# Patient Record
Sex: Female | Born: 1940 | Race: White | Hispanic: No | Marital: Married | Smoking: Never smoker
Health system: Southern US, Community
[De-identification: ages and names within clinical notes are randomized; demographics above are authoritative.]

## PROBLEM LIST (undated history)

## (undated) DIAGNOSIS — F5104 Psychophysiologic insomnia: Secondary | ICD-10-CM

## (undated) DIAGNOSIS — E785 Hyperlipidemia, unspecified: Secondary | ICD-10-CM

## (undated) DIAGNOSIS — K635 Polyp of colon: Secondary | ICD-10-CM

## (undated) DIAGNOSIS — I10 Essential (primary) hypertension: Secondary | ICD-10-CM

## (undated) HISTORY — PX: TUBAL LIGATION: SHX77

## (undated) HISTORY — DX: Polyp of colon: K63.5

## (undated) HISTORY — PX: OTHER SURGICAL HISTORY: SHX169

## (undated) HISTORY — PX: COLONOSCOPY: SHX174

## (undated) HISTORY — DX: Hyperlipidemia, unspecified: E78.5

## (undated) HISTORY — DX: Psychophysiologic insomnia: F51.04

## (undated) HISTORY — PX: BREAST EXCISIONAL BIOPSY: SUR124

## (undated) HISTORY — PX: BREAST SURGERY: SHX581

## (undated) HISTORY — DX: Essential (primary) hypertension: I10

---

## 1999-01-06 ENCOUNTER — Encounter: Admission: RE | Admit: 1999-01-06 | Discharge: 1999-02-08 | Payer: Self-pay | Admitting: Orthopedic Surgery

## 1999-03-31 ENCOUNTER — Encounter: Admission: RE | Admit: 1999-03-31 | Discharge: 1999-04-19 | Payer: Self-pay | Admitting: Orthopedic Surgery

## 2001-02-22 ENCOUNTER — Emergency Department (HOSPITAL_COMMUNITY): Admission: EM | Admit: 2001-02-22 | Discharge: 2001-02-22 | Payer: Self-pay | Admitting: *Deleted

## 2006-04-19 ENCOUNTER — Ambulatory Visit (HOSPITAL_BASED_OUTPATIENT_CLINIC_OR_DEPARTMENT_OTHER): Admission: RE | Admit: 2006-04-19 | Discharge: 2006-04-19 | Payer: Self-pay | Admitting: Surgery

## 2006-04-19 ENCOUNTER — Encounter (INDEPENDENT_AMBULATORY_CARE_PROVIDER_SITE_OTHER): Payer: Self-pay | Admitting: Specialist

## 2006-11-04 ENCOUNTER — Ambulatory Visit (HOSPITAL_COMMUNITY): Admission: RE | Admit: 2006-11-04 | Discharge: 2006-11-04 | Payer: Self-pay | Admitting: Family Medicine

## 2007-02-27 HISTORY — PX: KNEE SURGERY: SHX244

## 2007-03-19 ENCOUNTER — Inpatient Hospital Stay (HOSPITAL_COMMUNITY): Admission: RE | Admit: 2007-03-19 | Discharge: 2007-03-21 | Payer: Self-pay | Admitting: Orthopedic Surgery

## 2007-05-12 ENCOUNTER — Encounter: Admission: RE | Admit: 2007-05-12 | Discharge: 2007-08-10 | Payer: Self-pay | Admitting: Orthopedic Surgery

## 2007-08-11 ENCOUNTER — Encounter: Admission: RE | Admit: 2007-08-11 | Discharge: 2007-09-10 | Payer: Self-pay | Admitting: Orthopedic Surgery

## 2008-12-08 ENCOUNTER — Ambulatory Visit: Payer: Self-pay | Admitting: Internal Medicine

## 2009-01-29 LAB — HM COLONOSCOPY

## 2009-02-01 ENCOUNTER — Ambulatory Visit: Payer: Self-pay | Admitting: Internal Medicine

## 2009-02-03 ENCOUNTER — Encounter: Payer: Self-pay | Admitting: Internal Medicine

## 2009-02-24 ENCOUNTER — Telehealth: Payer: Self-pay | Admitting: Internal Medicine

## 2009-03-01 LAB — HM PAP SMEAR: HM Pap smear: NEGATIVE

## 2009-10-26 ENCOUNTER — Ambulatory Visit: Payer: Self-pay | Admitting: Cardiology

## 2009-11-02 ENCOUNTER — Ambulatory Visit: Payer: Self-pay | Admitting: Cardiology

## 2009-12-30 LAB — HM MAMMOGRAPHY: HM Mammogram: NEGATIVE

## 2010-07-11 NOTE — Op Note (Signed)
NAME:  Cassandra Foster, Cassandra Foster              ACCOUNT NO.:  [id number]   MEDICAL RECORD NO.:  [id number]          PATIENT TYPE:  OIB   LOCATION:  0098                         FACILITY:  [facility]   PHYSICIAN:  Georges Lynch. Gioffre, M.D.DATE OF BIRTH:  12/09/1940   DATE OF PROCEDURE:  03/19/2007  DATE OF DISCHARGE:                               OPERATIVE REPORT   SURGEON:  Georges Lynch. Darrelyn Hillock, M.D.   ASSISTANT:  Jamelle Rushing, P.A.   PREOPERATIVE DIAGNOSIS:  Comminuted fracture of the left patella.   POSTOPERATIVE DIAGNOSIS:  Comminuted fracture of the left patella.   OPERATION:  1. Open reduction internal fixation of a comminuted fracture of the      left patella utilizing two 4-0 cannulated DePuy screws.  2. We also utilized a Restore graft to the patellar tendon and      fracture site.   PROCEDURE:  Under general anesthesia, routine orthopedic prep and drape  of left lower extremity was carried out.  The leg was exsanguinated with  an Esmarch and tourniquet was elevated at 350 mmHg.  A curvilinear  incision was made over the anterior aspect of the left leg.  She had a  healed abrasion over the anterior aspect of her leg so great care was  taken not to go through that abrasion site.  The abrasion site was  totally clean.  We waited approximately 5 days before any surgery was  done because of that.  She also was on Keflex prior to the surgery.  We  then curetted to create the two flaps.  We went down and identified the  fracture site.  Initially it looked as though the fracture site was a  clean fracture, but as we went down and explored it, it was comminuted  out laterally.  We were able to take a towel clip, hold the edges  together and two K-wires were placed up across the fracture site.  Two 4-  0 screws measuring 40 mm and 32 mm for both screws.  One was 40 was 32  mm in length were used across the fracture site.  Note, because of the  comminution and because of the condition of the patellar  tendon once we  repaired the fracture site we utilized a Restore graft as well  anteriorly.  C-arm was brought in to make sure the screws were not in  the articular surface of the joint space and they were not.  They were  in good position.  We thoroughly irrigated out the area, reapproximated  everything as we mentioned.  The tendon was reapproximated, that is the  patellar tendon and we put a nice graft over the patellar and quadriceps  tendon region.  We sutured that in place with zero Vicryl suture.  The  subcu was closed with zero Vicryl, skin with metal staples.  Sterile  Neosporin dressing was applied.  She was kept in extension and placed in  a nice knee immobilizer.  She had 1 gram of IV Ancef preop.           ______________________________  Georges Lynch. Darrelyn Hillock, M.D.  RAG/MEDQ  D:  03/19/2007  T:  03/19/2007  Job:  [id number]   cc:   Windy Fast A. Darrelyn Hillock, M.D.  Fax: [phone]

## 2010-07-14 NOTE — Op Note (Signed)
NAME:  Cassandra Foster, Cassandra Foster              ACCOUNT NO.:  [id number]   MEDICAL RECORD NO.:  [id number]          PATIENT TYPE:  AMB   LOCATION:  DSC                          FACILITY:  [facility]   PHYSICIAN:  Velora Heckler, MD      DATE OF BIRTH:  October 09, 1940   DATE OF PROCEDURE:  DATE OF DISCHARGE:                               OPERATIVE REPORT   PREOPERATIVE DIAGNOSIS:  Soft tissue mass, right shoulder (6 cm x 6 cm x  3 cm).   POSTOPERATIVE DIAGNOSIS:  Soft tissue mass, right shoulder (6 cm x 6 cm  x 3 cm).   PROCEDURE:  Excision soft tissue mass, right shoulder (6 cm x 6 cm x 3  cm).   SURGEON:  Velora Heckler, MD, FACS   ASSISTANT:  None.   ANESTHESIA:  Local with intravenous sedation per Dr. Claybon Jabs.   ESTIMATED BLOOD LOSS:  Minimal.   PREPARATION:  Betadine.   COMPLICATIONS:  None.   INDICATIONS:  The patient is a 70 year old white female from [place],  [place].  She has a soft tissue mass on the right shoulder.  She  had had previous excision a number of years ago, but the mass had  recurred.  It has essentially doubled in size over the past year.  She  now comes to surgery for excision.   DESCRIPTION OF PROCEDURE:  The procedure is done OR #8 at the [facility].  The patient is brought to the operating room and placed  in a prone position on the operating room table.  Following  administration of intravenous sedation, the patient was prepped and  draped in the usual strict aseptic fashion.  After ascertaining and an  adequate level of sedation had been obtained, the skin overlying the  mass was anesthetized with local anesthetic.  Local anesthetic is  infiltrated around the previous broad scar.  Using a #15 blade, the  previous scar is excised with the mass.  Incision is extended medially  over the mass.  Skin flaps are elevated circumferentially.  Using the  electrocautery for hemostasis the mass is completely excised down to the  underlying muscle  fascia.  This has the appearance of a multi-lobulated  lipoma.  Good hemostasis is obtained with the electrocautery.  Mass  measures approximately 6 cm in greatest dimension and is submitted to  pathology for review.  Good hemostasis is achieved.  Subcutaneous  tissues are reapproximated with interrupted 3-0 Vicryl sutures.  Skin is  closed with a  running 4-0 Monocryl subcuticular suture.  Wound is washed and dried and  Benzo and Steri-Strips are applied.  Sterile dressings are applied.  The  patient is awakened from anesthesia and brought to the recovery room in  stable condition.  The patient tolerated the procedure well.      Velora Heckler, MD  Electronically Signed     TMG/MEDQ  D:  04/19/2006  T:  04/20/2006  Job:  [id number]   cc:   Alfredia Client, MD  Colleen Can. Deborah Chalk, M.D.

## 2010-11-17 LAB — COMPREHENSIVE METABOLIC PANEL
ALT: 44 — ABNORMAL HIGH
AST: 37
Calcium: 9.3
Creatinine, Ser: 0.67
Sodium: 133 — ABNORMAL LOW
Total Protein: 6.6

## 2010-11-17 LAB — CBC
HCT: 38.1
MCHC: 34.7
MCV: 90.2
Platelets: 146 — ABNORMAL LOW
RDW: 12.6

## 2010-11-17 LAB — URINALYSIS, ROUTINE W REFLEX MICROSCOPIC
Bilirubin Urine: NEGATIVE
Hgb urine dipstick: NEGATIVE
Nitrite: NEGATIVE
Protein, ur: NEGATIVE
Specific Gravity, Urine: 1.029
Urobilinogen, UA: 0.2

## 2010-11-17 LAB — DIFFERENTIAL
Basophils Absolute: 0
Eosinophils Relative: 0
Lymphocytes Relative: 8 — ABNORMAL LOW
Lymphs Abs: 1.3
Monocytes Relative: 6
Neutro Abs: 13.5 — ABNORMAL HIGH

## 2010-11-17 LAB — PROTIME-INR
INR: 0.9
INR: 1.4
Prothrombin Time: 12.7
Prothrombin Time: 17.2 — ABNORMAL HIGH

## 2010-11-17 LAB — APTT: aPTT: 32

## 2010-11-17 LAB — URINE MICROSCOPIC-ADD ON

## 2010-11-30 ENCOUNTER — Telehealth: Payer: Self-pay | Admitting: Family Medicine

## 2010-11-30 ENCOUNTER — Encounter: Payer: Self-pay | Admitting: Family Medicine

## 2010-11-30 ENCOUNTER — Ambulatory Visit (INDEPENDENT_AMBULATORY_CARE_PROVIDER_SITE_OTHER): Payer: Medicare Other | Admitting: Family Medicine

## 2010-11-30 DIAGNOSIS — G4700 Insomnia, unspecified: Secondary | ICD-10-CM

## 2010-11-30 DIAGNOSIS — I10 Essential (primary) hypertension: Secondary | ICD-10-CM

## 2010-11-30 DIAGNOSIS — Z23 Encounter for immunization: Secondary | ICD-10-CM

## 2010-11-30 DIAGNOSIS — F5104 Psychophysiologic insomnia: Secondary | ICD-10-CM

## 2010-11-30 DIAGNOSIS — E785 Hyperlipidemia, unspecified: Secondary | ICD-10-CM

## 2010-11-30 DIAGNOSIS — K635 Polyp of colon: Secondary | ICD-10-CM | POA: Insufficient documentation

## 2010-11-30 DIAGNOSIS — M810 Age-related osteoporosis without current pathological fracture: Secondary | ICD-10-CM

## 2010-11-30 LAB — BASIC METABOLIC PANEL
Calcium: 10.6 mg/dL — ABNORMAL HIGH (ref 8.4–10.5)
Creatinine, Ser: 0.6 mg/dL (ref 0.4–1.2)
GFR: 97.36 mL/min (ref >60.00)
Sodium: 139 mEq/L (ref 135–145)

## 2010-11-30 LAB — HEPATIC FUNCTION PANEL
ALT: 30 U/L (ref 0–35)
AST: 28 U/L (ref 0–37)
Albumin: 4.9 g/dL (ref 3.5–5.2)
Alkaline Phosphatase: 86 U/L (ref 39–117)
Total Protein: 7.8 g/dL (ref 6.0–8.3)

## 2010-11-30 LAB — LIPID PANEL
Cholesterol: 183 mg/dL (ref 0–200)
HDL: 64.2 mg/dL (ref >39.00)
Total CHOL/HDL Ratio: 3
Triglycerides: 105 mg/dL (ref 0.0–149.0)

## 2010-11-30 MED ORDER — TRAZODONE HCL 50 MG PO TABS: 50.0000 mg | ORAL_TABLET | Freq: Every day | ORAL | Status: DC

## 2010-11-30 MED ORDER — TRAZODONE HCL 50 MG PO TABS: ORAL_TABLET | ORAL | Status: DC

## 2010-11-30 NOTE — Telephone Encounter (Signed)
This was done earlier.

## 2010-11-30 NOTE — Telephone Encounter (Signed)
Pharmacist called and needs clarification on pts Trazadone instructions.  Pls call asap.

## 2010-11-30 NOTE — Progress Notes (Signed)
  Subjective:    Patient ID: Cassandra Foster, female    DOB: [birth year], 70 y.o.   MRN: [id number]  HPI New patient to establish care. Past medical history reviewed. She has history of chronic insomnia, colon polyps, hypertension, hyperlipidemia. Medications reviewed. Currently takes trazodone 50 mg 2-3 tablets at night has taken this for years. Requests refills. No flu vaccine yet. Pneumovax 2 years ago. Tetanus 2009. Colonoscopy 2010.  Reported osteoporosis history. Followup with gynecologist. Takes vitamin D and calcium and apparently recently started on bisphosphonate but she is not sure the name of medication. Requests vitamin D level today. No lipids in some time. Myalgias with daily simvastatin but currently taking every other day without problem. No history of CAD. Blood pressure fairly well controlled by home readings. No headaches or dizziness.  Family history significant for mother with breast cancer. Father had stroke.  Patient is married. No history of smoking or alcohol use.  Past Medical History  Diagnosis Date  . Colon polyps    Past Surgical History  Procedure Date  . Breast surgery     1985, 1997  . Knee surgery 2009    kneecap    reports that she has never smoked. She does not have any smokeless tobacco history on file. She reports that she does not drink alcohol. Her drug history not on file. family history includes Cancer in her mother and Stroke in her father. No Known Allergies    Review of Systems  Constitutional: Negative for fever, activity change, appetite change, fatigue and unexpected weight change.  HENT: Negative for hearing loss, ear pain, sore throat and trouble swallowing.   Eyes: Negative for visual disturbance.  Respiratory: Negative for cough and shortness of breath.   Cardiovascular: Negative for chest pain and palpitations.  Gastrointestinal: Negative for abdominal pain, diarrhea, constipation and blood in stool.  Genitourinary: Negative for  dysuria and hematuria.  Musculoskeletal: Negative for myalgias, back pain and arthralgias.  Skin: Negative for rash.  Neurological: Negative for dizziness, syncope and headaches.  Hematological: Negative for adenopathy.  Psychiatric/Behavioral: Negative for confusion and dysphoric mood.       Objective:   Physical Exam  Constitutional: She is oriented to person, place, and time. She appears well-developed and well-nourished.  HENT:  Right Ear: External ear normal.  Left Ear: External ear normal.  Mouth/Throat: No oropharyngeal exudate.  Neck: Neck supple. No thyromegaly present.  Cardiovascular: Normal rate, regular rhythm and normal heart sounds.  Exam reveals no gallop.   No murmur heard. Pulmonary/Chest: Effort normal and breath sounds normal. No respiratory distress. She has no wheezes. She has no rales.  Abdominal: Soft. There is no tenderness.  Musculoskeletal: She exhibits no edema.  Lymphadenopathy:    She has no cervical adenopathy.  Neurological: She is alert and oriented to person, place, and time. No cranial nerve deficit.  Skin: No rash noted.  Psychiatric: She has a normal mood and affect. Her behavior is normal.          Assessment & Plan:  #1 hyperlipidemia. Check lipid and hepatic panel #2 hypertension stable. Continue current medication and monitor. Work on weight loss #3 chronic insomnia. Titrate trazodone to 150 mg each bedtime with refills given. Educational sheet given on chronic insomnia #4 history of colon polyps and family history colon cancer Brother #5 history of reported osteoporosis. Reassess vitamin D level. Patient plans to get followup DEXA scan through gynecologist #6 health maintenance. Flu vaccine given.

## 2010-11-30 NOTE — Patient Instructions (Signed)

## 2010-12-01 LAB — VITAMIN D 25 HYDROXY (VIT D DEFICIENCY, FRACTURES): Vit D, 25-Hydroxy: 69 ng/mL (ref 30–89)

## 2010-12-01 NOTE — Progress Notes (Signed)
Quick Note:  Pt informed ______ 

## 2010-12-06 ENCOUNTER — Telehealth: Payer: Self-pay | Admitting: Family Medicine

## 2010-12-06 NOTE — Telephone Encounter (Signed)
Pt wanted to know if she could have her blood work sent to her for her and her husband. Please contact

## 2010-12-06 NOTE — Telephone Encounter (Signed)
Cassandra Foster[birth year] labs and pt labs mailed to their home, pt aware

## 2011-01-31 ENCOUNTER — Telehealth: Payer: Self-pay

## 2011-01-31 NOTE — Telephone Encounter (Signed)
Pt is going to gynecology appt on Monday and needs her blood work sent to his office.  Dr. Jolayne Haines [facility].  Labs have been faxed.

## 2011-02-21 ENCOUNTER — Telehealth: Payer: Self-pay

## 2011-02-21 NOTE — Telephone Encounter (Signed)
Per Dr. Tawanna Cooler pt to take 1/2 losartan in the am and 1 losartan at bedtime.  Pt aware of appt with Dr. Tawanna Cooler on 02/22/11.  Pt aware if bp is too high in the morning to take 1/2 losartan if bp is normal do not take 1/2. Pt aware if symptoms worsen to go to Urgent Care or ER.

## 2011-02-21 NOTE — Telephone Encounter (Signed)
Pt states she has congestion, stuffy/ runny nose, headache, fatigue and a deep hard cough.  Pt denies fever. Pt her blood pressure last night was 187/86, pt's bp now is 173/78 and pulse is 91.  Pt states she has been taking Robutssin DM; last dose was at 8 am.  Pt takes her losartan at night time.    Per Dr. Tawanna Cooler pt to take 1/2 losartan at 6pm. Pt advised  To not consume salt and stay well hydrated.

## 2011-02-22 ENCOUNTER — Encounter: Payer: Self-pay | Admitting: Family Medicine

## 2011-02-22 ENCOUNTER — Ambulatory Visit (INDEPENDENT_AMBULATORY_CARE_PROVIDER_SITE_OTHER): Payer: Medicare Other | Admitting: Family Medicine

## 2011-02-22 DIAGNOSIS — I10 Essential (primary) hypertension: Secondary | ICD-10-CM

## 2011-02-22 DIAGNOSIS — J069 Acute upper respiratory infection, unspecified: Secondary | ICD-10-CM

## 2011-02-22 MED ORDER — HYDROCODONE-HOMATROPINE 5-1.5 MG/5ML PO SYRP
ORAL_SOLUTION | ORAL | Status: DC
Start: 2011-02-22 — End: 2011-04-02

## 2011-02-22 NOTE — Patient Instructions (Signed)
Stop all the over-the-counter cough and cold medications that you have been taking, including the old cough syrup.  Drink lots of water, Hydromet 0.5-teaspoon up to 3 times a day for cough and cold.  Continue the losartin ,,,,,,,,,,,,, one tablet daily in the morning.  Check her blood pressure daily in the morning and return to see Dr. Caryl Never in one week with the data and the device

## 2011-02-22 NOTE — Progress Notes (Signed)
  Subjective:    Patient ID: Cassandra Foster, female    DOB: [birth year], 70 y.o.   MRN: [id number]  HPI Cassandra Foster is a 70-year-old female nonsmoker, patient of Dr. Bb the comes in today for evaluation of hypertension, and a cold.  She set a cold for about for 5 days and began taking over-the-counter medications, which included Mucinex, Robitussin, and some old cough syrup.  Another doctor gave her years ago.  Yesterday her blood pressure when up 180/86.  She was advised to take an extra 50 mg of low start and stop the over-the-counter medications and come in today for follow-up.  She states this morning.  She took 2 teaspoons of Robitussin.  BP by me right arm sitting position 150/70  Or cold symptoms or head congestion, postnasal drip, and cough.  No fever, no sputum production.  No wheezing   Review of Systems    General pulmonary review of systems otherwise negative Objective:   Physical Exam  Well-developed well-nourished, female, in no acute distress.  HEENT negative.  Neck was supple.  No adenopathy.  Lungs are clear.  BP right arm sitting position 150/70, pulse 70 and regular      Assessment & Plan:  Viral syndrome treat symptomatically.  Stop all the over-the-counter medications, which are increasing.  Her blood pressure.  Elevated blood pressure secondary to over-the-counter medications.  Again, stop the over-the-counter medication .......... Continue the losartan 100 mg daily BP check daily in the morning.  Follow-up with Dr. Caryl Never in one week for follow-up

## 2011-03-01 ENCOUNTER — Encounter: Payer: Self-pay | Admitting: Family Medicine

## 2011-03-01 ENCOUNTER — Ambulatory Visit (INDEPENDENT_AMBULATORY_CARE_PROVIDER_SITE_OTHER): Payer: Medicare Other | Admitting: Family Medicine

## 2011-03-01 VITALS — BP 144/70 | Temp 98.2°F | Wt 154.0 lb

## 2011-03-01 DIAGNOSIS — I10 Essential (primary) hypertension: Secondary | ICD-10-CM

## 2011-03-01 MED ORDER — LOSARTAN POTASSIUM-HCTZ 100-12.5 MG PO TABS: 1.0000 | ORAL_TABLET | Freq: Every day | ORAL | Status: DC

## 2011-03-01 NOTE — Progress Notes (Signed)
  Subjective:    Patient ID: Cassandra Foster, female    DOB: 07-21-1940, 71 y.o.   MRN: [id number]  HPI  Followup hypertension. Takes losartan 100 mg daily. Recent cold-like symptoms. Taking over-the-counter medications including possibly Mucinex D. Blood pressure elevated with those medications. Has been off over-the-counter supplements for one week. Cough improved. No fever. Had one blood pressure reading 187/86. Mostly 140-150 systolic by home readings. Brings in home cuff today to compare with ours and very consistent readings. No headaches. No dizziness.   Review of Systems  Constitutional: Negative for fatigue.  Eyes: Negative for visual disturbance.  Respiratory: Negative for cough, chest tightness, shortness of breath and wheezing.   Cardiovascular: Negative for chest pain, palpitations and leg swelling.  Neurological: Negative for dizziness, seizures, syncope, weakness, light-headedness and headaches.       Objective:   Physical Exam  Constitutional: She appears well-developed and well-nourished.  Neck: Neck supple. No thyromegaly present.  Cardiovascular: Normal rate and regular rhythm.   Pulmonary/Chest: Effort normal and breath sounds normal. No respiratory distress. She has no wheezes. She has no rales.  Musculoskeletal: She exhibits no edema.  Lymphadenopathy:    She has no cervical adenopathy.          Assessment & Plan:  Hypertension. Suboptimal control. Change losartan to losartan HCTZ 100/12.5 one daily. Reassess blood pressure one month. Continue weight loss efforts. Avoid decongestants.

## 2011-04-02 ENCOUNTER — Encounter: Payer: Self-pay | Admitting: Family Medicine

## 2011-04-02 ENCOUNTER — Ambulatory Visit (INDEPENDENT_AMBULATORY_CARE_PROVIDER_SITE_OTHER): Payer: Medicare Other | Admitting: Family Medicine

## 2011-04-02 VITALS — BP 160/88 | Temp 97.8°F | Wt 153.0 lb

## 2011-04-02 DIAGNOSIS — I10 Essential (primary) hypertension: Secondary | ICD-10-CM

## 2011-04-02 DIAGNOSIS — R232 Flushing: Secondary | ICD-10-CM

## 2011-04-02 DIAGNOSIS — K5900 Constipation, unspecified: Secondary | ICD-10-CM

## 2011-04-02 DIAGNOSIS — R5383 Other fatigue: Secondary | ICD-10-CM

## 2011-04-02 LAB — BASIC METABOLIC PANEL
CO2: 26 mEq/L (ref 19–32)
Calcium: 9.3 mg/dL (ref 8.4–10.5)
GFR: 106.83 mL/min (ref >60.00)
Sodium: 131 mEq/L — ABNORMAL LOW (ref 135–145)

## 2011-04-02 LAB — TSH: TSH: 1.52 u[IU]/mL (ref 0.35–5.50)

## 2011-04-02 LAB — HEPATIC FUNCTION PANEL
Alkaline Phosphatase: 72 U/L (ref 39–117)
Bilirubin, Direct: 0.1 mg/dL (ref 0.0–0.3)
Total Bilirubin: 0.5 mg/dL (ref 0.3–1.2)
Total Protein: 7.3 g/dL (ref 6.0–8.3)

## 2011-04-02 NOTE — Progress Notes (Signed)
  Subjective:    Patient ID: Cassandra Foster, female    DOB: November 17, 1940, 71 y.o.   MRN: [id number]  HPI  Followup hypertension. We added losartan HCTZ last visit. Blood pressures have improved by home readings but still has occasional spike up. She's recently had some flushed feeling. Recent labs significant for a minimally elevated calcium of 10.6. No prior history of hypercalcemia (is on HCTZ). She's had some other issues with fatigue and constipation. She taken stool softeners without improvement. Has recently tried MiraLax which seems to help. She's not exercising regularly. Modest fluid intake.  Past Medical History  Diagnosis Date  . Colon polyps   . Hyperlipidemia   . Hypertension   . Osteoporosis   . Chronic insomnia    Past Surgical History  Procedure Date  . Breast surgery     1985, 1997  . Knee surgery 2009    kneecap    reports that she has never smoked. She does not have any smokeless tobacco history on file. She reports that she does not drink alcohol. Her drug history not on file. family history includes Cancer in her mother and Stroke in her father. No Known Allergies    Review of Systems  Constitutional: Positive for fatigue. Negative for fever, chills, appetite change and unexpected weight change.  Respiratory: Negative for cough and shortness of breath.   Cardiovascular: Negative for chest pain, palpitations and leg swelling.  Gastrointestinal: Negative for abdominal pain.  Genitourinary: Negative for dysuria.  Skin: Negative for rash.  Neurological: Negative for dizziness, seizures, syncope, weakness and headaches.  Hematological: Negative for adenopathy. Does not bruise/bleed easily.  Psychiatric/Behavioral: Negative for confusion.       Objective:   Physical Exam  Constitutional: She appears well-developed and well-nourished.  HENT:  Mouth/Throat: Oropharynx is clear and moist.  Eyes: Pupils are equal, round, and reactive to light.  Neck: Neck  supple. No thyromegaly present.  Cardiovascular: Normal rate and regular rhythm.   Pulmonary/Chest: Effort normal and breath sounds normal. No respiratory distress. She has no wheezes. She has no rales.  Musculoskeletal: She exhibits no edema.  Lymphadenopathy:    She has no cervical adenopathy.          Assessment & Plan:  #1 hypertension. Slightly elevated here today but improved by home readings. Work on weight loss and continue to monitor. By home readings she has been mostly in the 120s to 130s systolic and 80s diastolic #2 history of recent mild hypercalcemia. ?related to HCTZ.  Recheck calcium level along with albumin for correction. Further assessment if this is climbing #3 recent issues with fatigue and constipation. Difficulty losing weight. Check TSH.

## 2011-04-02 NOTE — Patient Instructions (Signed)
Reduce sodium intake Work on weight loss Continue to monitor blood pressure

## 2011-04-04 NOTE — Progress Notes (Signed)
Quick Note:  Pt informed ______ 

## 2011-04-17 ENCOUNTER — Telehealth: Payer: Self-pay | Admitting: Cardiology

## 2011-04-17 NOTE — Telephone Encounter (Signed)
09/27/10 & 01/24/11  mailed reminder to schedule f/u 

## 2011-05-10 ENCOUNTER — Encounter: Payer: Self-pay | Admitting: *Deleted

## 2011-05-16 ENCOUNTER — Ambulatory Visit (INDEPENDENT_AMBULATORY_CARE_PROVIDER_SITE_OTHER): Payer: Medicare Other | Admitting: Family Medicine

## 2011-05-16 ENCOUNTER — Encounter: Payer: Self-pay | Admitting: Family Medicine

## 2011-05-16 VITALS — BP 140/68 | Temp 97.7°F | Wt 153.0 lb

## 2011-05-16 DIAGNOSIS — R682 Dry mouth, unspecified: Secondary | ICD-10-CM

## 2011-05-16 DIAGNOSIS — H04129 Dry eye syndrome of unspecified lacrimal gland: Secondary | ICD-10-CM

## 2011-05-16 DIAGNOSIS — IMO0001 Reserved for inherently not codable concepts without codable children: Secondary | ICD-10-CM

## 2011-05-16 DIAGNOSIS — K117 Disturbances of salivary secretion: Secondary | ICD-10-CM

## 2011-05-16 DIAGNOSIS — M791 Myalgia, unspecified site: Secondary | ICD-10-CM

## 2011-05-16 DIAGNOSIS — H04123 Dry eye syndrome of bilateral lacrimal glands: Secondary | ICD-10-CM

## 2011-05-16 DIAGNOSIS — J069 Acute upper respiratory infection, unspecified: Secondary | ICD-10-CM

## 2011-05-16 MED ORDER — HYDROCODONE-HOMATROPINE 5-1.5 MG/5ML PO SYRP: ORAL_SOLUTION | ORAL | Status: DC

## 2011-05-16 NOTE — Progress Notes (Signed)
  Subjective:    Patient ID: Cassandra Foster, female    DOB: 22-Nov-1940, 71 y.o.   MRN: [id number]  HPI   patient seen with 2-3 week history of progressive body aches. This involves mostly the arms and trunk. Sparing of the lower extremities. She's had some increasing fatigue. Question low-grade fever around 100. She's had occasional respiratory symptoms of cough and nasal congestion. No sore throat. Denies any nausea or vomiting. She's not really having arthralgias but myalgias. She also complains of some progressive dry eyes and mouth now for several months. She is using Restasis for her eyes with some improvement. She does not take any highly anticholinergic medications. She is hydrating well.  She has occasional occipital headaches. No visual changes. No chest pain.  Past Medical History  Diagnosis Date  . Colon polyps   . Hyperlipidemia   . Hypertension   . Osteoporosis   . Chronic insomnia    Past Surgical History  Procedure Date  . Breast surgery     1985, 1997  . Knee surgery 2009    kneecap  . Other surgical history     childbirth x 3    reports that she has never smoked. She does not have any smokeless tobacco history on file. She reports that she does not drink alcohol. Her drug history not on file. family history includes Cancer in her mother and Stroke in her father. No Known Allergies   Review of Systems  Constitutional: Positive for fatigue.  Eyes: Negative for visual disturbance.  Respiratory: Negative for cough and shortness of breath.   Cardiovascular: Negative for chest pain.  Gastrointestinal: Negative for abdominal pain.  Musculoskeletal: Positive for myalgias. Negative for back pain, joint swelling, arthralgias and gait problem.  Neurological: Negative for syncope and weakness.  Hematological: Negative for adenopathy. Does not bruise/bleed easily.       Objective:   Physical Exam  Constitutional: She is oriented to person, place, and time. She  appears well-developed and well-nourished.  HENT:  Right Ear: External ear normal.  Left Ear: External ear normal.  Mouth/Throat: Oropharynx is clear and moist.  Eyes: Pupils are equal, round, and reactive to light.  Neck: Neck supple. No thyromegaly present.  Cardiovascular: Normal rate and regular rhythm.   Pulmonary/Chest: Effort normal and breath sounds normal. No respiratory distress. She has no wheezes. She has no rales.  Musculoskeletal: She exhibits no edema.  Lymphadenopathy:    She has no cervical adenopathy.  Neurological: She is alert and oriented to person, place, and time. No cranial nerve deficit.  Psychiatric: She has a normal mood and affect. Her behavior is normal.          Assessment & Plan:  Patient presents with symptom complex of fatigue, myalgias, and question low-grade fever along with dry eyes and dry mouth. Differential includes polymyalgia rheumatica and Sjogren's syndrome. Start with sed rate, antinuclear antibodies, and Sjogren's antibody panel. She is leaving off statin for now.

## 2011-05-17 LAB — ANTI-NUCLEAR AB-TITER (ANA TITER): ANA Titer 1: 1:80 {titer} — ABNORMAL HIGH

## 2011-05-17 LAB — SJOGRENS SYNDROME-A EXTRACTABLE NUCLEAR ANTIBODY: SSA (Ro) (ENA) Antibody, IgG: 3 AU/mL (ref <30)

## 2011-05-17 LAB — SJOGRENS SYNDROME-B EXTRACTABLE NUCLEAR ANTIBODY: SSB (La) (ENA) Antibody, IgG: <1 AU/mL (ref <30)

## 2011-05-18 NOTE — Progress Notes (Signed)
Quick Note:  Pt informed ______ 

## 2011-05-31 ENCOUNTER — Ambulatory Visit: Payer: Medicare Other | Admitting: Family Medicine

## 2011-06-01 ENCOUNTER — Other Ambulatory Visit: Payer: Self-pay | Admitting: *Deleted

## 2011-06-01 MED ORDER — TRAZODONE HCL 50 MG PO TABS: ORAL_TABLET | ORAL | Status: DC

## 2011-07-02 ENCOUNTER — Ambulatory Visit (INDEPENDENT_AMBULATORY_CARE_PROVIDER_SITE_OTHER): Payer: Medicare Other | Admitting: Family Medicine

## 2011-07-02 ENCOUNTER — Encounter: Payer: Self-pay | Admitting: Family Medicine

## 2011-07-02 VITALS — BP 140/80 | Temp 98.3°F | Wt 152.0 lb

## 2011-07-02 DIAGNOSIS — E785 Hyperlipidemia, unspecified: Secondary | ICD-10-CM

## 2011-07-02 DIAGNOSIS — M791 Myalgia, unspecified site: Secondary | ICD-10-CM

## 2011-07-02 DIAGNOSIS — I10 Essential (primary) hypertension: Secondary | ICD-10-CM

## 2011-07-02 DIAGNOSIS — IMO0001 Reserved for inherently not codable concepts without codable children: Secondary | ICD-10-CM

## 2011-07-02 DIAGNOSIS — H43811 Vitreous degeneration, right eye: Secondary | ICD-10-CM | POA: Insufficient documentation

## 2011-07-02 DIAGNOSIS — H43819 Vitreous degeneration, unspecified eye: Secondary | ICD-10-CM

## 2011-07-02 MED ORDER — PRAVASTATIN SODIUM 20 MG PO TABS: 20.0000 mg | ORAL_TABLET | Freq: Every day | ORAL | Status: DC

## 2011-07-02 NOTE — Progress Notes (Signed)
  Subjective:    Patient ID: Cassandra Foster, female    DOB: [birth year], 71 y.o.   MRN: [id number]  HPI  Medical followup. Patient had significant myalgias and she stopped her simvastatin. Inflammatory markers did not show any evidence for clear connective tissue disease. Her myalgias eventually improved off simvastatin. She was previously taking lovastatin and Lipitor did not tolerate either. She does not recall previous trial of pravastatin. No cardiac history though does have positive family history.  Reported recent right vitreous detachment. Occurred last Wednesday after heavy lifting. Went to [facility] ophthalmology. She was not pleased with her care and is requesting referral to Dr. Alan Mulder.  Some watering of the eye but no major visual deficit.  Hypertension treated with Hyzaar 100/12.5 mg. Blood pressures fairly stable by home readings. No orthostasis.  Past Medical History  Diagnosis Date  . Colon polyps   . Hyperlipidemia   . Hypertension   . Osteoporosis   . Chronic insomnia    Past Surgical History  Procedure Date  . Breast surgery     1985, 1997  . Knee surgery 2009    kneecap  . Other surgical history     childbirth x 3    reports that she has never smoked. She does not have any smokeless tobacco history on file. She reports that she does not drink alcohol. Her drug history not on file. family history includes Cancer in her mother and Stroke in her father. Allergies  Allergen Reactions  . Simvastatin     Body aches      Review of Systems  Constitutional: Negative for fatigue and unexpected weight change.  Eyes: Negative for photophobia, pain and redness.  Respiratory: Negative for cough, chest tightness, shortness of breath and wheezing.   Cardiovascular: Negative for chest pain, palpitations and leg swelling.  Neurological: Negative for dizziness, seizures, syncope, weakness, light-headedness and headaches.       Objective:   Physical Exam    Constitutional: She is oriented to person, place, and time. She appears well-developed and well-nourished.  Eyes: Pupils are equal, round, and reactive to light.  Neck: Neck supple. No thyromegaly present.  Cardiovascular: Normal rate and regular rhythm.   Pulmonary/Chest: Effort normal and breath sounds normal. No respiratory distress. She has no wheezes. She has no rales.  Musculoskeletal: She exhibits no edema.  Neurological: She is alert and oriented to person, place, and time.          Assessment & Plan:  #1 hypertension. Stable by home readings. Continue current medications #2 myalgias probably related to simvastatin. She is willing to try pravastatin 20 mg daily and recheck lipids and hepatic panel in 2 months  #3 hyperlipidemia with intolerance to multiple medications as above  #4 recent right vitreous detachment. Ophthalmology referral made.

## 2011-07-05 ENCOUNTER — Encounter (INDEPENDENT_AMBULATORY_CARE_PROVIDER_SITE_OTHER): Payer: Medicare Other | Admitting: Ophthalmology

## 2011-07-05 DIAGNOSIS — H43819 Vitreous degeneration, unspecified eye: Secondary | ICD-10-CM

## 2011-07-05 DIAGNOSIS — H4310 Vitreous hemorrhage, unspecified eye: Secondary | ICD-10-CM

## 2011-07-05 DIAGNOSIS — I10 Essential (primary) hypertension: Secondary | ICD-10-CM

## 2011-07-05 DIAGNOSIS — H3560 Retinal hemorrhage, unspecified eye: Secondary | ICD-10-CM

## 2011-07-05 DIAGNOSIS — H2510 Age-related nuclear cataract, unspecified eye: Secondary | ICD-10-CM

## 2011-07-05 DIAGNOSIS — D3130 Benign neoplasm of unspecified choroid: Secondary | ICD-10-CM

## 2011-07-05 DIAGNOSIS — H35039 Hypertensive retinopathy, unspecified eye: Secondary | ICD-10-CM

## 2011-07-06 ENCOUNTER — Ambulatory Visit (INDEPENDENT_AMBULATORY_CARE_PROVIDER_SITE_OTHER)
Admission: RE | Admit: 2011-07-06 | Discharge: 2011-07-06 | Disposition: A | Discharge status: Home / Self Care | Payer: Medicare Other | Source: Ambulatory Visit | Attending: Family Medicine | Admitting: Family Medicine

## 2011-07-06 ENCOUNTER — Encounter: Payer: Self-pay | Admitting: Family Medicine

## 2011-07-06 ENCOUNTER — Ambulatory Visit (INDEPENDENT_AMBULATORY_CARE_PROVIDER_SITE_OTHER): Payer: Medicare Other | Admitting: Family Medicine

## 2011-07-06 VITALS — BP 160/70 | Temp 98.9°F | Wt 154.0 lb

## 2011-07-06 DIAGNOSIS — R51 Headache: Secondary | ICD-10-CM

## 2011-07-06 DIAGNOSIS — R55 Syncope and collapse: Secondary | ICD-10-CM

## 2011-07-06 LAB — CBC WITH DIFFERENTIAL/PLATELET
Basophils Absolute: 0 10*3/uL (ref 0.0–0.1)
HCT: 39.7 % (ref 36.0–46.0)
Lymphocytes Relative: 25.5 % (ref 12.0–46.0)
Lymphs Abs: 2 10*3/uL (ref 0.7–4.0)
Monocytes Relative: 9.5 % (ref 3.0–12.0)
Neutrophils Relative %: 62.4 % (ref 43.0–77.0)
Platelets: 161 10*3/uL (ref 150.0–400.0)
RDW: 12.4 % (ref 11.5–14.6)
WBC: 7.9 10*3/uL (ref 4.5–10.5)

## 2011-07-06 LAB — BASIC METABOLIC PANEL
CO2: 25 mEq/L (ref 19–32)
Calcium: 9.2 mg/dL (ref 8.4–10.5)
Potassium: 3.9 mEq/L (ref 3.5–5.1)
Sodium: 130 mEq/L — ABNORMAL LOW (ref 135–145)

## 2011-07-06 NOTE — Progress Notes (Signed)
  Subjective:    Patient ID: Cassandra Foster, female    DOB: 1940-08-10, 71 y.o.   MRN: [id number]  HPI  Syncopal episode this morning at home around 9:00. She was ambulating in her kitchen and went to place something under her kitchen sink when she apparently had syncopal episode. She did not feel dizziness previous to this. He is not sure how long she might of been unconscious. She denied any preceding chest pain or palpitations. No dizziness since then and feels well. No history of seizure. No lethargy. Denies any focal weakness. No incontinence. No orthostatic symptoms. She's not aware of any injuries from her fall. She thinks she may have hit the right side of her head when she fell. She's had some L temporal headache since then but only mildly progressive through the day. No focal weakness. No prior history of syncope.  No nausea or vomiting. No acute visual change-since the fall.  Chronic problems include history of hypertension, hyperlipidemia, and osteoporosis.  Past Medical History  Diagnosis Date  . Colon polyps   . Hyperlipidemia   . Hypertension   . Osteoporosis   . Chronic insomnia    Past Surgical History  Procedure Date  . Breast surgery     1985, 1997  . Knee surgery 2009    kneecap  . Other surgical history     childbirth x 3    reports that she has never smoked. She does not have any smokeless tobacco history on file. She reports that she does not drink alcohol. Her drug history not on file. family history includes Cancer in her mother and Stroke in her father. Allergies  Allergen Reactions  . Simvastatin     Body aches      Review of Systems  Constitutional: Negative for fever, chills, appetite change and unexpected weight change.  Cardiovascular: Negative for palpitations.  Gastrointestinal: Negative for nausea, vomiting, abdominal pain and blood in stool.  Neurological: Positive for syncope. Negative for seizures and weakness.  Hematological: Negative  for adenopathy. Does not bruise/bleed easily.  Psychiatric/Behavioral: Negative for confusion.       Objective:   Physical Exam  Constitutional: She is oriented to person, place, and time. She appears well-developed and well-nourished.  HENT:  Mouth/Throat: Oropharynx is clear and moist.  Neck: Neck supple. No thyromegaly present.  Cardiovascular: Normal rate and regular rhythm.  Exam reveals no gallop.   Pulmonary/Chest: Effort normal and breath sounds normal. No respiratory distress. She has no wheezes. She has no rales.  Musculoskeletal: She exhibits no edema.  Lymphadenopathy:    She has no cervical adenopathy.  Neurological: She is alert and oriented to person, place, and time. She has normal reflexes. No cranial nerve deficit. Coordination normal.          Assessment & Plan:  Reported syncopal episode. By history, this did not sound like vasovagal syncope. No orthostasis. Low suspicion for seizure. Clinically does not appear anemic or dehydrated. Etiology unclear. Check EKG. Check basic labs. Consider cardiology evaluation with EP specialist.  Recommended no driving until further evaluation.  With closed head injury right side and now left sided headache, noncontrast CT to further evaluate to r/o subdural.

## 2011-07-06 NOTE — Patient Instructions (Signed)
Syncope You have had a fainting (syncopal) spell. A fainting episode is a sudden, short-lived loss of consciousness. It results in complete recovery. It occurs because there has been a temporary shortage of oxygen and/or sugar (glucose) to the brain. CAUSES   Blood pressure pills and other medications that may lower blood pressure below normal. Sudden changes in posture (sudden standing).   Over-medication. Take your medications as directed.   Standing too long. This can cause blood to pool in the legs.   Seizure disorders.   Low blood sugar (hypoglycemia) of diabetes. This more commonly causes coma.   Bearing down to go to the bathroom. This can cause your blood pressure to rise suddenly. Your body compensates by making the blood pressure too low when you stop bearing down.   Hardening of the arteries where the brain temporarily does not receive enough blood.   Irregular heart beat and circulatory problems.   Fear, emotional distress, injury, sight of blood, or illness.  Your caregiver will send you home if the syncope was from non-worrisome causes (benign). Depending on your age and health, you may stay to be monitored and observed. If you return home, have someone stay with you if your caregiver feels that is desirable. It is very important to keep all follow-up referrals and appointments in order to properly manage this condition. This is a serious problem which can lead to serious illness and death if not carefully managed.  WARNING: Do not drive or operate machinery until your caregiver feels that it is safe for you to do so. SEEK IMMEDIATE MEDICAL CARE IF:   You have another fainting episode or faint while lying or sitting down. DO NOT DRIVE YOURSELF. Call 911 if no other help is available.   You have chest pain, are feeling sick to your stomach (nausea), vomiting or abdominal pain.   You have an irregular heartbeat or one that is very fast (pulse over 120 beats per minute).    You have a loss of feeling in some part of your body or lose movement in your arms or legs.   You have difficulty with speech, confusion, severe weakness, or visual problems.   You become sweaty and/or feel light headed.  Make sure you are rechecked as instructed. Document Released: 02/12/2005 Document Revised: 02/01/2011 Document Reviewed: 10/03/2006 [employer] Patient Information 2012 [employer]. 

## 2011-07-08 NOTE — Progress Notes (Signed)
Addended by: Kristian Covey on: 07/08/2011 07:10 AM   Modules accepted: Orders

## 2011-08-01 ENCOUNTER — Other Ambulatory Visit (HOSPITAL_COMMUNITY): Payer: Self-pay | Admitting: Internal Medicine

## 2011-08-01 ENCOUNTER — Ambulatory Visit (INDEPENDENT_AMBULATORY_CARE_PROVIDER_SITE_OTHER): Payer: Medicare Other | Admitting: Internal Medicine

## 2011-08-01 ENCOUNTER — Ambulatory Visit (HOSPITAL_COMMUNITY): Payer: Medicare Other | Attending: Cardiovascular Disease

## 2011-08-01 ENCOUNTER — Encounter: Payer: Self-pay | Admitting: Internal Medicine

## 2011-08-01 VITALS — BP 150/84 | HR 72 | Ht 62.0 in | Wt 156.8 lb

## 2011-08-01 DIAGNOSIS — E785 Hyperlipidemia, unspecified: Secondary | ICD-10-CM | POA: Insufficient documentation

## 2011-08-01 DIAGNOSIS — R55 Syncope and collapse: Secondary | ICD-10-CM

## 2011-08-01 DIAGNOSIS — I10 Essential (primary) hypertension: Secondary | ICD-10-CM | POA: Insufficient documentation

## 2011-08-01 DIAGNOSIS — I519 Heart disease, unspecified: Secondary | ICD-10-CM | POA: Insufficient documentation

## 2011-08-01 NOTE — Patient Instructions (Signed)
Your physician has requested that you have an echocardiogram. Echocardiography is a painless test that uses sound waves to create images of your heart. It provides your doctor with information about the size and shape of your heart and how well your heart's chambers and valves are working. This procedure takes approximately one hour. There are no restrictions for this procedure.  Your physician recommends that you schedule a follow-up appointment with Dr. Ladona Ridgel as needed.

## 2011-08-01 NOTE — Progress Notes (Signed)
Echocardiogram performed.  

## 2011-08-03 ENCOUNTER — Telehealth: Payer: Self-pay | Admitting: Internal Medicine

## 2011-08-03 NOTE — Telephone Encounter (Signed)
Preliminary result given to pt on her echo/ trivial regurgitation reported. Told her she will get one more call when Dr Ladona Ridgel reviews. Pt agreed to plan.

## 2011-08-03 NOTE — Telephone Encounter (Signed)
Please return call to patient at [phone] regarding echo results.

## 2011-08-04 ENCOUNTER — Encounter: Payer: Self-pay | Admitting: Internal Medicine

## 2011-08-04 DIAGNOSIS — R55 Syncope and collapse: Secondary | ICD-10-CM | POA: Insufficient documentation

## 2011-08-04 NOTE — Progress Notes (Signed)
HPI Cassandra Foster is a pleasant 71 yo woman with a h/o HTN and dyslipidemia. She was in the kitchen several weeks ago when she experienced a syncopal episode while in the kitchen. The patient denies any warning that the episode was going to occur. No chest pain or sob. The patient denies any associated loss of bowel or bladder continence. She denies a h/o orthostasis. No nausea, vomiting or diaphorsis. She was not sure how long she was out. No episodes since. No family history of sudden death. Allergies  Allergen Reactions  . Simvastatin     Body aches     Current Outpatient Prescriptions  Medication Sig Dispense Refill  . aspirin 81 MG tablet Take 81 mg by mouth daily.      . ATELVIA 35 MG TBEC Take 35 mg by mouth once a week.       . calcium carbonate (OS-CAL - DOSED IN MG OF ELEMENTAL CALCIUM) 1250 MG tablet Take 1 tablet by mouth daily.        . Cholecalciferol (VITAMIN D3) 5000 UNITS CHEW Chew by mouth.        . fish oil-omega-3 fatty acids 1000 MG capsule Take 2 g by mouth daily.        Marland Kitchen losartan-hydrochlorothiazide (HYZAAR) 100-12.5 MG per tablet Take 1 tablet by mouth daily.  90 tablet  3  . Magnesium 500 MG CAPS Take by mouth.      . Multiple Vitamin (MULTIVITAMIN) tablet Take 1 tablet by mouth daily.        . pravastatin (PRAVACHOL) 20 MG tablet Take 1 tablet (20 mg total) by mouth daily.  30 tablet  5  . RESTASIS 0.05 % ophthalmic emulsion Place 1 drop into both eyes every 12 (twelve) hours.       . traZODone (DESYREL) 50 MG tablet Take 50 mg by mouth. 2 tabs at night time         Past Medical History  Diagnosis Date  . Colon polyps   . Hyperlipidemia   . Hypertension   . Osteoporosis   . Chronic insomnia     ROS:   All systems reviewed and negative except as noted in the HPI.   Past Surgical History  Procedure Date  . Breast surgery     1985, 1997  . Knee surgery 2009    kneecap  . Other surgical history     childbirth x 3     Family History  Problem  Relation Age of Onset  . Cancer Mother     breast  . Stroke Father      History   Social History  . Marital Status: Married    Spouse Name: N/A    Number of Children: N/A  . Years of Education: N/A   Occupational History  . Not on file.   Social History Main Topics  . Smoking status: Never Smoker   . Smokeless tobacco: Not on file  . Alcohol Use: No  . Drug Use: Not on file  . Sexually Active: Not on file   Other Topics Concern  . Not on file   Social History Narrative  . No narrative on file     BP 150/84  Pulse 72  Ht 5\' 2"  (1.575 m)  Wt 156 lb 12.8 oz (71.124 kg)  BMI 28.68 kg/m2  Physical Exam:  Well appearing 71 yo woman, NAD HEENT: Unremarkable Neck:  No JVD, no thyromegally Lungs:  Clear with no wheezes. HEART:  Regular rate  rhythm, no murmurs, no rubs, no clicks Abd:  soft, positive bowel sounds, no organomegally, no rebound, no guarding Ext:  2 plus pulses, no edema, no cyanosis, no clubbing Skin:  No rashes no nodules Neuro:  CN II through XII intact, motor grossly intact  EKG NSR   Assess/Plan:

## 2011-08-04 NOTE — Assessment & Plan Note (Signed)
The etiology is unclear. She has had only one episode. Her ECG is unrevealing. I have discussed the treatment options and have recommended we check a 2 D echo. If her LV function is normal, would recommend watchful waiting. Other eval will depend on the results of her echo.

## 2011-08-17 ENCOUNTER — Encounter (INDEPENDENT_AMBULATORY_CARE_PROVIDER_SITE_OTHER): Payer: Medicare Other | Admitting: Ophthalmology

## 2011-08-17 DIAGNOSIS — H2510 Age-related nuclear cataract, unspecified eye: Secondary | ICD-10-CM

## 2011-08-17 DIAGNOSIS — H35039 Hypertensive retinopathy, unspecified eye: Secondary | ICD-10-CM

## 2011-08-17 DIAGNOSIS — D3130 Benign neoplasm of unspecified choroid: Secondary | ICD-10-CM

## 2011-08-17 DIAGNOSIS — I10 Essential (primary) hypertension: Secondary | ICD-10-CM

## 2011-08-17 DIAGNOSIS — H43819 Vitreous degeneration, unspecified eye: Secondary | ICD-10-CM

## 2011-09-12 ENCOUNTER — Ambulatory Visit (INDEPENDENT_AMBULATORY_CARE_PROVIDER_SITE_OTHER): Payer: Medicare Other | Admitting: Family Medicine

## 2011-09-12 ENCOUNTER — Encounter: Payer: Self-pay | Admitting: Family Medicine

## 2011-09-12 VITALS — BP 150/88 | Temp 98.7°F | Wt 151.0 lb

## 2011-09-12 DIAGNOSIS — R55 Syncope and collapse: Secondary | ICD-10-CM

## 2011-09-12 DIAGNOSIS — I10 Essential (primary) hypertension: Secondary | ICD-10-CM

## 2011-09-12 MED ORDER — AMLODIPINE BESYLATE 2.5 MG PO TABS: 2.5000 mg | ORAL_TABLET | Freq: Every day | ORAL | Status: DC

## 2011-09-12 NOTE — Progress Notes (Signed)
  Subjective:    Patient ID: Cassandra Foster, female    DOB: 1941/01/14, 71 y.o.   MRN: [id number]  HPI  Followup visit. Recent syncopal episode. Patient saw cardiologist. Echocardiogram revealed mild LVH otherwise normal. No further episodes since then. She has hypertension treated with losartan HCTZ. Several home blood pressure readings around 150s systolic occasionally 170 systolic and lower readings down to130 at times. She is consistently taking medication. Denies chest pains. No dyspnea. No recent headaches. Recent EKG unremarkable.  Past Medical History  Diagnosis Date  . Colon polyps   . Hyperlipidemia   . Hypertension   . Osteoporosis   . Chronic insomnia    Past Surgical History  Procedure Date  . Breast surgery     1985, 1997  . Knee surgery 2009    kneecap  . Other surgical history     childbirth x 3    reports that she has never smoked. She does not have any smokeless tobacco history on file. She reports that she does not drink alcohol. Her drug history not on file. family history includes Cancer in her mother and Stroke in her father. Allergies  Allergen Reactions  . Simvastatin     Body aches      Review of Systems  Constitutional: Negative for fatigue.  Eyes: Negative for visual disturbance.  Respiratory: Negative for cough, chest tightness, shortness of breath and wheezing.   Cardiovascular: Negative for chest pain, palpitations and leg swelling.  Gastrointestinal: Negative for abdominal pain.  Neurological: Negative for dizziness, seizures, syncope, weakness, light-headedness and headaches.  Psychiatric/Behavioral: Negative for confusion.       Objective:   Physical Exam  Constitutional: She is oriented to person, place, and time. She appears well-developed and well-nourished.  Neck: Neck supple. No thyromegaly present.  Cardiovascular: Normal rate and regular rhythm.   Pulmonary/Chest: Effort normal and breath sounds normal. No respiratory  distress. She has no wheezes. She has no rales.  Musculoskeletal: She exhibits no edema.  Neurological: She is alert and oriented to person, place, and time. No cranial nerve deficit.          Assessment & Plan:  Hypertension. Suboptimally controlled. Add amlodipine 2.5 mg once daily. Continue losartan HCTZ. Reassess one month and bring home blood pressure readings then. Recent Syncope.  ? Etiology.  No recurrent symptoms since then.

## 2011-09-12 NOTE — Patient Instructions (Signed)

## 2011-10-10 ENCOUNTER — Ambulatory Visit (INDEPENDENT_AMBULATORY_CARE_PROVIDER_SITE_OTHER): Payer: Medicare Other | Admitting: Family Medicine

## 2011-10-10 ENCOUNTER — Encounter: Payer: Self-pay | Admitting: Family Medicine

## 2011-10-10 VITALS — BP 130/70 | Temp 98.0°F | Wt 150.0 lb

## 2011-10-10 DIAGNOSIS — I10 Essential (primary) hypertension: Secondary | ICD-10-CM

## 2011-10-10 MED ORDER — AMLODIPINE BESYLATE 5 MG PO TABS: 5.0000 mg | ORAL_TABLET | Freq: Every day | ORAL | Status: DC

## 2011-10-10 NOTE — Progress Notes (Signed)
  Subjective:    Patient ID: Cassandra Foster, female    DOB: [birth year], 71 y.o.   MRN: [id number]  HPI  Patient seen for followup hypertension. We added amlodipine 2.5 mg last visit. No headaches. No edema issues. Blood pressure slightly improved but tends to have higher readings late afternoon still occasional around 150 systolic. Mostly 130s systolic. No orthostasis. Also remains on pravastatin, aspirin, and losartan HCTZ. She has normal renal function.   Review of Systems  Constitutional: Negative for fatigue.  Eyes: Negative for visual disturbance.  Respiratory: Negative for cough, chest tightness, shortness of breath and wheezing.   Cardiovascular: Negative for chest pain, palpitations and leg swelling.  Neurological: Negative for dizziness, seizures, syncope, weakness, light-headedness and headaches.       Objective:   Physical Exam  Constitutional: She appears well-developed and well-nourished.  Neck: Neck supple. No thyromegaly present.  Cardiovascular: Normal rate and regular rhythm.   Pulmonary/Chest: Effort normal and breath sounds normal. No respiratory distress. She has no wheezes. She has no rales.  Musculoskeletal: She exhibits no edema.          Assessment & Plan:  Hypertension improved but still occasional suboptimal readings. Increase amlodipine to 5 mg. Continue losartan HCTZ. Followup 3 months and repeat lipids/hepatic then.

## 2011-10-30 ENCOUNTER — Telehealth: Payer: Self-pay | Admitting: Family Medicine

## 2011-10-30 NOTE — Telephone Encounter (Signed)
Pt requesting you call her back. Pain in esophagus and down through GI tract. Refused CAN and Triage nurse. Will only talk to you.

## 2011-10-30 NOTE — Telephone Encounter (Signed)
Pt needs OV to evaluate esophagus irritation.  She is requesting Rx for Nexium and I informed her Dr Caryl Never would want to evaluate her sx first to be sure that is what she needed.  Scheduled pt for OV tomorrow at 9:45 am  Pt aware of date ane time.

## 2011-10-31 ENCOUNTER — Encounter: Payer: Self-pay | Admitting: Family Medicine

## 2011-10-31 ENCOUNTER — Ambulatory Visit (INDEPENDENT_AMBULATORY_CARE_PROVIDER_SITE_OTHER): Payer: Medicare Other | Admitting: Family Medicine

## 2011-10-31 VITALS — BP 124/72 | Temp 98.0°F | Wt 151.0 lb

## 2011-10-31 DIAGNOSIS — K219 Gastro-esophageal reflux disease without esophagitis: Secondary | ICD-10-CM

## 2011-10-31 NOTE — Progress Notes (Signed)
  Subjective:    Patient ID: Cassandra Foster, female    DOB: 1940/04/12, 71 y.o.   MRN: [id number]  HPI  Patient seen acute visit with one week history of intermittent "irritation" in her esophagus. She complains of some mild discomfort when swallowing but no true dysphagia. She's also had some increased hoarseness. Symptoms seem to be worse after eating tomatoes. She tried some Zantac and Pepcid without relief. She took her husband's Nexium for 3 days and seem to be helping but ran out. She has recently reduced caffeine use. No vomiting. No nausea. No abnormal pain. No appetite or weight changes. No recent antibiotics. No recent prednisone use. No history of thrush.  Past Medical History  Diagnosis Date  . Colon polyps   . Hyperlipidemia   . Hypertension   . Osteoporosis   . Chronic insomnia    Past Surgical History  Procedure Date  . Breast surgery     1985, 1997  . Knee surgery 2009    kneecap  . Other surgical history     childbirth x 3    reports that she has never smoked. She does not have any smokeless tobacco history on file. She reports that she does not drink alcohol. Her drug history not on file. family history includes Cancer in her mother and Stroke in her father. Allergies  Allergen Reactions  . Simvastatin     Body aches      Review of Systems  Constitutional: Negative for fever, chills, appetite change and unexpected weight change.  Respiratory: Negative for cough and shortness of breath.   Cardiovascular: Negative for chest pain.  Gastrointestinal: Negative for nausea, vomiting, abdominal pain and abdominal distention.       Objective:   Physical Exam  Constitutional: She appears well-developed and well-nourished.  HENT:  Mouth/Throat: Oropharynx is clear and moist.  Neck: Neck supple.  Cardiovascular: Normal rate and regular rhythm.   Pulmonary/Chest: Effort normal and breath sounds normal. No respiratory distress. She has no wheezes. She has no  rales.  Abdominal: Soft. She exhibits no mass. There is no tenderness. There is no rebound and no guarding.          Assessment & Plan:  GERD. Probably exacerbated by citric foods like tomatoes. Diet sheet given. Elevate head of bed 6-8 inches. Avoid eating within 3 hours of bedtime. Continue reduced caffeine use. Dexilant 60 mg one daily and patient instructed to followup in one month if symptoms not fully resolved

## 2011-10-31 NOTE — Patient Instructions (Addendum)
Diet for GERD or PUD Nutrition therapy can help ease the discomfort of gastroesophageal reflux disease (GERD) and peptic ulcer disease (PUD).  HOME CARE INSTRUCTIONS   Eat your meals slowly, in a relaxed setting.   Eat 5 to 6 small meals per day.   If a food causes distress, stop eating it for a period of time.  FOODS TO AVOID  Coffee, regular or decaffeinated.   Cola beverages, regular or low calorie.   Tea, regular or decaffeinated.   Pepper.   Cocoa.   High fat foods, including meats.   Butter, margarine, hydrogenated oil (trans fats).   Peppermint or spearmint (if you have GERD).   Fruits and vegetables if not tolerated.   Alcohol.   Nicotine (smoking or chewing). This is one of the most potent stimulants to acid production in the gastrointestinal tract.   Any food that seems to aggravate your condition.  If you have questions regarding your diet, ask your caregiver or a registered dietitian. TIPS  Lying flat may make symptoms worse. Keep the head of your bed raised 6 to 9 inches (15 to 23 cm) by using a foam wedge or blocks under the legs of the bed.   Do not lay down until 3 hours after eating a meal.   Daily physical activity may help reduce symptoms.  MAKE SURE YOU:   Understand these instructions.   Will watch your condition.   Will get help right away if you are not doing well or get worse.  Document Released: 02/12/2005 Document Revised: 02/01/2011 Document Reviewed: 12/29/2010 [facility] Patient Information 2012 [place], [place].  Dexilant 60 mg one daily Consider elevate head of bed 6-8 inches. Let me hear from you in one month if symptoms not fully resolved

## 2012-01-10 ENCOUNTER — Ambulatory Visit (INDEPENDENT_AMBULATORY_CARE_PROVIDER_SITE_OTHER): Payer: Medicare Other | Admitting: Family Medicine

## 2012-01-10 ENCOUNTER — Encounter: Payer: Self-pay | Admitting: Family Medicine

## 2012-01-10 VITALS — BP 120/68 | Temp 97.6°F | Wt 146.0 lb

## 2012-01-10 DIAGNOSIS — K5900 Constipation, unspecified: Secondary | ICD-10-CM

## 2012-01-10 DIAGNOSIS — Z23 Encounter for immunization: Secondary | ICD-10-CM

## 2012-01-10 DIAGNOSIS — K219 Gastro-esophageal reflux disease without esophagitis: Secondary | ICD-10-CM | POA: Insufficient documentation

## 2012-01-10 MED ORDER — ESOMEPRAZOLE MAGNESIUM 40 MG PO CPDR: 40.0000 mg | DELAYED_RELEASE_CAPSULE | Freq: Every day | ORAL | Status: DC

## 2012-01-10 NOTE — Patient Instructions (Addendum)
Try to taper off Nexium after a few weeks if your reflux symptoms are improving. Aim to get 20-30 grams of fiber through your diet every day to help with constipation.    Gastroesophageal Reflux Disease, Adult Gastroesophageal reflux disease (GERD) happens when acid from your stomach flows up into the esophagus. When acid comes in contact with the esophagus, the acid causes soreness (inflammation) in the esophagus. Over time, GERD may create small holes (ulcers) in the lining of the esophagus. CAUSES   Increased body weight. This puts pressure on the stomach, making acid rise from the stomach into the esophagus.  Smoking. This increases acid production in the stomach.  Drinking alcohol. This causes decreased pressure in the lower esophageal sphincter (valve or ring of muscle between the esophagus and stomach), allowing acid from the stomach into the esophagus.  Late evening meals and a full stomach. This increases pressure and acid production in the stomach.  A malformed lower esophageal sphincter. Sometimes, no cause is found. SYMPTOMS   Burning pain in the lower part of the mid-chest behind the breastbone and in the mid-stomach area. This may occur twice a week or more often.  Trouble swallowing.  Sore throat.  Dry cough.  Asthma-like symptoms including chest tightness, shortness of breath, or wheezing. DIAGNOSIS  Your caregiver may be able to diagnose GERD based on your symptoms. In some cases, X-rays and other tests may be done to check for complications or to check the condition of your stomach and esophagus. TREATMENT  Your caregiver may recommend over-the-counter or prescription medicines to help decrease acid production. Ask your caregiver before starting or adding any new medicines.  HOME CARE INSTRUCTIONS   Change the factors that you can control. Ask your caregiver for guidance concerning weight loss, quitting smoking, and alcohol consumption.  Avoid foods and drinks  that make your symptoms worse, such as:  Caffeine or alcoholic drinks.  Chocolate.  Peppermint or mint flavorings.  Garlic and onions.  Spicy foods.  Citrus fruits, such as oranges, lemons, or limes.  Tomato-based foods such as sauce, chili, salsa, and pizza.  Fried and fatty foods.  Avoid lying down for the 3 hours prior to your bedtime or prior to taking a nap.  Eat small, frequent meals instead of large meals.  Wear loose-fitting clothing. Do not wear anything tight around your waist that causes pressure on your stomach.  Raise the head of your bed 6 to 8 inches with wood blocks to help you sleep. Extra pillows will not help.  Only take over-the-counter or prescription medicines for pain, discomfort, or fever as directed by your caregiver.  Do not take aspirin, ibuprofen, or other nonsteroidal anti-inflammatory drugs (NSAIDs). SEEK IMMEDIATE MEDICAL CARE IF:   You have pain in your arms, neck, jaw, teeth, or back.  Your pain increases or changes in intensity or duration.  You develop nausea, vomiting, or sweating (diaphoresis).  You develop shortness of breath, or you faint.  Your vomit is green, yellow, black, or looks like coffee grounds or blood.  Your stool is red, bloody, or black. These symptoms could be signs of other problems, such as heart disease, gastric bleeding, or esophageal bleeding. MAKE SURE YOU:   Understand these instructions.  Will watch your condition.  Will get help right away if you are not doing well or get worse. Document Released: 11/22/2004 Document Revised: 05/07/2011 Document Reviewed: 09/01/2010 [facility] Patient Information 2013 [place], [place].   Constipation, Adult Constipation is when a person has fewer  than 3 bowel movements a week; has difficulty having a bowel movement; or has stools that are dry, hard, or larger than normal. As people grow older, constipation is more common. If you try to fix constipation with medicines  that make you have a bowel movement (laxatives), the problem may get worse. Long-term laxative use may cause the muscles of the colon to become weak. A low-fiber diet, not taking in enough fluids, and taking certain medicines may make constipation worse. CAUSES   Certain medicines, such as antidepressants, pain medicine, iron supplements, antacids, and water pills.   Certain diseases, such as diabetes, irritable bowel syndrome (IBS), thyroid disease, or depression.   Not drinking enough water.   Not eating enough fiber-rich foods.   Stress or travel.  Lack of physical activity or exercise.  Not going to the restroom when there is the urge to have a bowel movement.  Ignoring the urge to have a bowel movement.  Using laxatives too much. SYMPTOMS   Having fewer than 3 bowel movements a week.   Straining to have a bowel movement.   Having hard, dry, or larger than normal stools.   Feeling full or bloated.   Pain in the lower abdomen.  Not feeling relief after having a bowel movement. DIAGNOSIS  Your caregiver will take a medical history and perform a physical exam. Further testing may be done for severe constipation. Some tests may include:   A barium enema X-ray to examine your rectum, colon, and sometimes, your small intestine.  A sigmoidoscopy to examine your lower colon.  A colonoscopy to examine your entire colon. TREATMENT  Treatment will depend on the severity of your constipation and what is causing it. Some dietary treatments include drinking more fluids and eating more fiber-rich foods. Lifestyle treatments may include regular exercise. If these diet and lifestyle recommendations do not help, your caregiver may recommend taking over-the-counter laxative medicines to help you have bowel movements. Prescription medicines may be prescribed if over-the-counter medicines do not work.  HOME CARE INSTRUCTIONS   Increase dietary fiber in your diet, such as fruits,  vegetables, whole grains, and beans. Limit high-fat and processed sugars in your diet, such as [place] fries, hamburgers, cookies, candies, and soda.   A fiber supplement may be added to your diet if you cannot get enough fiber from foods.   Drink enough fluids to keep your urine clear or pale yellow.   Exercise regularly or as directed by your caregiver.   Go to the restroom when you have the urge to go. Do not hold it.  Only take medicines as directed by your caregiver. Do not take other medicines for constipation without talking to your caregiver first. SEEK IMMEDIATE MEDICAL CARE IF:   You have bright red blood in your stool.   Your constipation lasts for more than 4 days or gets worse.   You have abdominal or rectal pain.   You have thin, pencil-like stools.  You have unexplained weight loss. MAKE SURE YOU:   Understand these instructions.  Will watch your condition.  Will get help right away if you are not doing well or get worse. Document Released: 11/11/2003 Document Revised: 05/07/2011 Document Reviewed: 01/16/2011 [facility] Patient Information 2013 [place], [place].

## 2012-01-10 NOTE — Progress Notes (Signed)
Subjective:     Patient ID: ONEDA DUFFETT, female   DOB: 11-23-1940, 71 y.o.   MRN: [id number]  HPI 71 year old with history of GERD, osteoporosis HTN and hyperlipidemia here for 64-month follow-up for GERD symptoms.  States that overall symptoms have improved since last visit, but are still troublesome for her.  Modifications she has tried include raising the head of her bed 6 inches, eliminating acidic foods (especially tomatoes) and caffeine from diet, and now only drinking water.  She notes epigastric pain when she eats a full meal, but not with lesser amounts of food.  Tried Dexilant for symptom relief, which she states worked ok, but felt better relief when she tried some of her husband's Nexium.  Also has discontinued all meds except amlopdipine, losartan/HCTZ and aspirin since last visit as part of attempts to control her GERD.  Also reports that she has begun to have constipation recently, with last BM greater than 1 week ago.  She maintains that she stays well hydrated, drinking roughly 3/4 of a gallon of water per day.  Started taking either Metamucil or Miralax (she cannot remember which) earlier this week for relief, so far without effect.  Review of Systems  Constitutional: Negative for activity change and appetite change.  Respiratory: Negative for chest tightness and shortness of breath.   Cardiovascular: Negative for chest pain.  Gastrointestinal: Positive for constipation.       GERD       Objective:   Physical Exam  Constitutional: She is oriented to person, place, and time. She appears well-developed and well-nourished. No distress.  Cardiovascular: Normal rate, regular rhythm and normal heart sounds.   Pulmonary/Chest: Effort normal and breath sounds normal. No respiratory distress.  Abdominal: Soft. Bowel sounds are normal. She exhibits no distension and no mass. There is no tenderness. There is no rebound and no guarding.  Neurological: She is alert and oriented to  person, place, and time.       Assessment:     71 year old with history of GERD, osteoporosis, HTN and hyperlipidemia here for 53-month follow-up of GERD symptoms.    Plan:     1. GERD: improving with lifestyle modifications noted above, but still a hindrance to pt's daily life.  Since she reports better effect with Nexium compared to Dexilant, try Nexium for 1 month, and continue off meds except amlodipine, losartan/HCTZ and aspirin.  Continue diet and lifestyle modifications currently in place.  If Nexium does not provide relief, will need to consider referral to GI for further workup. 2. Constipation: last colonoscopy in 2008 not worrisome, and pt does not appear to be bloated or impacted.  Need to verify with pt if she was taking Metamucil or Miralax.  If Metamucil, try Miralax.  If Miralax, continue until BM.  If no BM in the next several days, consider suppository.  Also encourage continued good hydration, and adequate fiber in diet.  Follow up if symptoms worsen or do not improve.  Marthann Schiller, MS3  Agree with assessment and plan as per Marthann Schiller, MS 3 Pt has had years of constipation intermittently with colonoscopy as above.  Increase fluids and try Miralax if not already taking.  Evelena Peat MD

## 2012-01-31 ENCOUNTER — Other Ambulatory Visit: Payer: Self-pay | Admitting: *Deleted

## 2012-01-31 DIAGNOSIS — I10 Essential (primary) hypertension: Secondary | ICD-10-CM

## 2012-01-31 MED ORDER — LOSARTAN POTASSIUM-HCTZ 100-12.5 MG PO TABS: 1.0000 | ORAL_TABLET | Freq: Every day | ORAL | Status: DC

## 2012-04-02 ENCOUNTER — Other Ambulatory Visit: Payer: Self-pay | Admitting: *Deleted

## 2012-04-02 MED ORDER — PRAVASTATIN SODIUM 20 MG PO TABS: 20.0000 mg | ORAL_TABLET | Freq: Every day | ORAL | Status: DC

## 2012-04-02 NOTE — Telephone Encounter (Signed)
Script filled for pravastatin 20 mg once daily

## 2012-04-25 ENCOUNTER — Other Ambulatory Visit: Payer: Self-pay | Admitting: *Deleted

## 2012-04-25 MED ORDER — AMLODIPINE BESYLATE 5 MG PO TABS: 5.0000 mg | ORAL_TABLET | Freq: Every day | ORAL | Status: DC

## 2012-04-25 MED ORDER — TRAZODONE HCL 50 MG PO TABS: ORAL_TABLET | ORAL | Status: DC

## 2012-05-14 ENCOUNTER — Encounter: Payer: Self-pay | Admitting: Cardiology

## 2012-07-08 ENCOUNTER — Emergency Department (HOSPITAL_COMMUNITY)
Admission: EM | Admit: 2012-07-08 | Discharge: 2012-07-08 | Disposition: A | Discharge status: Home / Self Care | Payer: Medicare Other | Attending: Emergency Medicine | Admitting: Emergency Medicine

## 2012-07-08 ENCOUNTER — Encounter (HOSPITAL_COMMUNITY): Payer: Self-pay | Admitting: *Deleted

## 2012-07-08 DIAGNOSIS — Z862 Personal history of diseases of the blood and blood-forming organs and certain disorders involving the immune mechanism: Secondary | ICD-10-CM | POA: Insufficient documentation

## 2012-07-08 DIAGNOSIS — E871 Hypo-osmolality and hyponatremia: Secondary | ICD-10-CM

## 2012-07-08 DIAGNOSIS — I10 Essential (primary) hypertension: Secondary | ICD-10-CM | POA: Insufficient documentation

## 2012-07-08 DIAGNOSIS — S2095XA Superficial foreign body of unspecified parts of thorax, initial encounter: Secondary | ICD-10-CM | POA: Insufficient documentation

## 2012-07-08 DIAGNOSIS — Z860100 Personal history of colon polyps, unspecified: Secondary | ICD-10-CM | POA: Insufficient documentation

## 2012-07-08 DIAGNOSIS — Z8639 Personal history of other endocrine, nutritional and metabolic disease: Secondary | ICD-10-CM | POA: Insufficient documentation

## 2012-07-08 DIAGNOSIS — Y929 Unspecified place or not applicable: Secondary | ICD-10-CM | POA: Insufficient documentation

## 2012-07-08 DIAGNOSIS — Z79899 Other long term (current) drug therapy: Secondary | ICD-10-CM | POA: Insufficient documentation

## 2012-07-08 DIAGNOSIS — W57XXXA Bitten or stung by nonvenomous insect and other nonvenomous arthropods, initial encounter: Secondary | ICD-10-CM

## 2012-07-08 DIAGNOSIS — G4700 Insomnia, unspecified: Secondary | ICD-10-CM | POA: Insufficient documentation

## 2012-07-08 DIAGNOSIS — M810 Age-related osteoporosis without current pathological fracture: Secondary | ICD-10-CM | POA: Insufficient documentation

## 2012-07-08 DIAGNOSIS — Y939 Activity, unspecified: Secondary | ICD-10-CM | POA: Insufficient documentation

## 2012-07-08 LAB — BASIC METABOLIC PANEL
Chloride: 90 mEq/L — ABNORMAL LOW (ref 96–112)
GFR calc Af Amer: >90 mL/min (ref >90)
Potassium: 3.3 mEq/L — ABNORMAL LOW (ref 3.5–5.1)

## 2012-07-08 LAB — CBC WITH DIFFERENTIAL/PLATELET
Basophils Absolute: 0 10*3/uL (ref 0.0–0.1)
Basophils Relative: 0 % (ref 0–1)
Eosinophils Absolute: 0.2 10*3/uL (ref 0.0–0.7)
MCH: 31.4 pg (ref 26.0–34.0)
MCHC: 36.4 g/dL — ABNORMAL HIGH (ref 30.0–36.0)
Neutro Abs: 4.7 10*3/uL (ref 1.7–7.7)
Neutrophils Relative %: 51 % (ref 43–77)
Platelets: 164 10*3/uL (ref 150–400)

## 2012-07-08 MED ORDER — DOXYCYCLINE HYCLATE 100 MG PO TABS
100.0000 mg | ORAL_TABLET | Freq: Once | ORAL | Status: AC
  Administered 2012-07-08: 100 mg via ORAL
  Filled 2012-07-08: qty 1

## 2012-07-08 MED ORDER — SODIUM CHLORIDE 0.9 % IV BOLUS (SEPSIS)
1000.0000 mL | Freq: Once | INTRAVENOUS | Status: AC
  Administered 2012-07-08: 1000 mL via INTRAVENOUS

## 2012-07-08 MED ORDER — POTASSIUM CHLORIDE CRYS ER 20 MEQ PO TBCR
40.0000 meq | EXTENDED_RELEASE_TABLET | Freq: Once | ORAL | Status: AC
  Administered 2012-07-08: 40 meq via ORAL
  Filled 2012-07-08: qty 2

## 2012-07-08 MED ORDER — DOXYCYCLINE HYCLATE 100 MG PO CAPS: 100.0000 mg | ORAL_CAPSULE | Freq: Two times a day (BID) | ORAL | Status: DC

## 2012-07-08 NOTE — ED Provider Notes (Signed)
History    This chart was scribed for Cassandra Lennert, MD by Gerlean Ren, ED Scribe. This patient was seen in room APA18/APA18 and the patient's care was started at 9:21 PM     CSN: [id number]  Arrival date & time 07/08/12  2054   First MD Initiated Contact with Patient 07/08/12 2119      Chief Complaint  Patient presents with  . Hypertension  . Headache     The history is provided by the patient. No language interpreter was used.  Cassandra Foster is a 72 y.o. female with h/o HTN who presents to the Emergency Department complaining of increased BP (184/81) with associated constant, non-changing posterior HA with gradual onset after performing light yard work earlier today.  Pt reports removing a tick from her left lateral ribs several days ago.  Pt denies fever.    Past Medical History  Diagnosis Date  . Colon polyps   . Hyperlipidemia   . Hypertension   . Osteoporosis   . Chronic insomnia     Past Surgical History  Procedure Laterality Date  . Breast surgery      1985, 1997  . Knee surgery  2009    kneecap  . Other surgical history      childbirth x 3    Family History  Problem Relation Age of Onset  . Cancer Mother     breast  . Stroke Father     History  Substance Use Topics  . Smoking status: Never Smoker   . Smokeless tobacco: Not on file  . Alcohol Use: No    No OB history provided.   Review of Systems  Constitutional: Negative for appetite change and fatigue.  HENT: Negative for congestion, sinus pressure and ear discharge.   Eyes: Negative for discharge.  Respiratory: Negative for cough.   Cardiovascular: Negative for chest pain.       Positive elevated BP  Gastrointestinal: Negative for abdominal pain and diarrhea.  Genitourinary: Negative for frequency and hematuria.  Musculoskeletal: Negative for back pain.  Skin: Negative for rash.  Neurological: Positive for headaches. Negative for seizures.  Psychiatric/Behavioral: Negative for  hallucinations.    Allergies  Simvastatin  Home Medications   Current Outpatient Rx  Name  Route  Sig  Dispense  Refill  . amLODipine (NORVASC) 5 MG tablet   Oral   Take 1 tablet (5 mg total) by mouth daily.   90 tablet   3   . ATELVIA 35 MG TBEC   Oral   Take 35 mg by mouth once a week.          . esomeprazole (NEXIUM) 40 MG capsule   Oral   Take 1 capsule (40 mg total) by mouth daily.   30 capsule   3   . fish oil-omega-3 fatty acids 1000 MG capsule   Oral   Take 2 g by mouth daily.           Marland Kitchen losartan-hydrochlorothiazide (HYZAAR) 100-12.5 MG per tablet   Oral   Take 1 tablet by mouth daily.   90 tablet   3   . Magnesium 500 MG CAPS   Oral   Take by mouth.         . Multiple Vitamin (MULTIVITAMIN) tablet   Oral   Take 1 tablet by mouth daily.           Bertram Gala Glycol-Propyl Glycol (SYSTANE OP)   Ophthalmic   Apply to eye.         Marland Kitchen  pravastatin (PRAVACHOL) 20 MG tablet   Oral   Take 1 tablet (20 mg total) by mouth daily.   30 tablet   3   . traZODone (DESYREL) 50 MG tablet      2 tabs at night time   180 tablet   3     BP 162/76  Pulse 76  Temp(Src) 97.2 F (36.2 C) (Oral)  Resp 20  Ht 5' 2.5" (1.588 m)  Wt 150 lb (68.04 kg)  BMI 26.98 kg/m2  SpO2 100%  Physical Exam  Nursing note and vitals reviewed. Constitutional: She is oriented to person, place, and time. She appears well-developed.  HENT:  Head: Normocephalic.  Eyes: Conjunctivae and EOM are normal. No scleral icterus.  Neck: Neck supple. No thyromegaly present.  Cardiovascular: Normal rate and regular rhythm.  Exam reveals no gallop and no friction rub.   No murmur heard. Pulmonary/Chest: No stridor. She has no wheezes. She has no rales. She exhibits no tenderness.  Abdominal: She exhibits no distension. There is no tenderness. There is no rebound.  Musculoskeletal: Normal range of motion. She exhibits no edema.  Left flank area healing tick bite 1cm inflamed  area.  Lymphadenopathy:    She has no cervical adenopathy.  Neurological: She is oriented to person, place, and time. Coordination normal.  Skin: No rash noted. No erythema.  Psychiatric: She has a normal mood and affect. Her behavior is normal.    ED Course  Procedures (including critical care time) DIAGNOSTIC STUDIES: Oxygen Saturation is 100% on room air, normal by my interpretation.    COORDINATION OF CARE: 9:24 PM- Patient informed of clinical course, understands medical decision-making process, and agrees with plan.  Labs Reviewed - No data to display No results found.   No diagnosis found.    MDM  Hyponatremia,  Will limit water intake and have pt follow up in 2 days     The chart was scribed for me under my direct supervision.  I personally performed the history, physical, and medical decision making and all procedures in the evaluation of this patient.Cassandra Lennert, MD 07/08/12 [phone]

## 2012-07-08 NOTE — ED Notes (Addendum)
Pt states she has checked her BP several times today and her highest BP was 184/81. States she has also had a nagging headache in the back of her head after doing yard work. Pt concerned about her BP

## 2012-07-09 ENCOUNTER — Telehealth: Payer: Self-pay | Admitting: Family Medicine

## 2012-07-09 NOTE — Telephone Encounter (Signed)
Caller confirmed appt for 07/10/12 @ 1430. No triage

## 2012-07-10 ENCOUNTER — Encounter: Payer: Self-pay | Admitting: Family Medicine

## 2012-07-10 ENCOUNTER — Ambulatory Visit (INDEPENDENT_AMBULATORY_CARE_PROVIDER_SITE_OTHER): Payer: Medicare Other | Admitting: Family Medicine

## 2012-07-10 VITALS — BP 130/60 | Temp 98.0°F | Wt 155.0 lb

## 2012-07-10 DIAGNOSIS — I10 Essential (primary) hypertension: Secondary | ICD-10-CM

## 2012-07-10 DIAGNOSIS — E871 Hypo-osmolality and hyponatremia: Secondary | ICD-10-CM

## 2012-07-10 DIAGNOSIS — E876 Hypokalemia: Secondary | ICD-10-CM

## 2012-07-10 LAB — BASIC METABOLIC PANEL
CO2: 29 mEq/L (ref 19–32)
Chloride: 103 mEq/L (ref 96–112)
Sodium: 138 mEq/L (ref 135–145)

## 2012-07-10 NOTE — Progress Notes (Signed)
  Subjective:    Patient ID: Cassandra Foster, female    DOB: 01-16-1941, 72 y.o.   MRN: [id number]  HPI Patient seen for ER followup. She was seen there 2 days ago with elevated blood pressure 184/81 and some nonspecific posterior headache. She also reported removing a tick around 07/04/2012. She did not have any fever or rash Her blood pressures generally been very well controlled. Patient had CBC which was normal. Chemistries significant for sodium 125 potassium 3.3. Patient generally drinks lots of water possibly over 1 gallon per day and was told to curtail that. She does not avoid sodium but also does not add much. She has liberalized sodium intake somewhat past few days and overall feels better. Headaches have resolved.  She is placed empirically on doxycycline because of her tick bite. She's not any nausea or vomiting. No recent diarrhea. She does take thiazide diuretic as part of losartan HCTZ and also remains on amlodipine 5 mg daily for hypertension. Recent thyroid function normal  Past Medical History  Diagnosis Date  . Colon polyps   . Hyperlipidemia   . Hypertension   . Osteoporosis   . Chronic insomnia    Past Surgical History  Procedure Laterality Date  . Breast surgery      1985, 1997  . Knee surgery  2009    kneecap  . Other surgical history      childbirth x 3    reports that she has never smoked. She does not have any smokeless tobacco history on file. She reports that she does not drink alcohol or use illicit drugs. family history includes Cancer in her mother and Stroke in her father. Allergies  Allergen Reactions  . Simvastatin Other (See Comments)    Body aches      Review of Systems  Constitutional: Negative for fever, chills, appetite change, fatigue and unexpected weight change.  Eyes: Negative for visual disturbance.  Respiratory: Negative for cough, chest tightness, shortness of breath and wheezing.   Cardiovascular: Negative for chest  pain, palpitations and leg swelling.  Gastrointestinal: Negative for nausea and vomiting.  Endocrine: Negative for polydipsia and polyuria.  Genitourinary: Negative for dysuria.  Neurological: Negative for dizziness, seizures, syncope, weakness, light-headedness and headaches.       Objective:   Physical Exam  Constitutional: She is oriented to person, place, and time. She appears well-developed and well-nourished. No distress.  HENT:  Right Ear: External ear normal.  Left Ear: External ear normal.  Mouth/Throat: Oropharynx is clear and moist.  Neck: Neck supple. No thyromegaly present.  Cardiovascular: Normal rate and regular rhythm.   Pulmonary/Chest: Effort normal and breath sounds normal. No respiratory distress. She has no wheezes. She has no rales.  Musculoskeletal: She exhibits no edema.  Neurological: She is alert and oriented to person, place, and time. No cranial nerve deficit.          Assessment & Plan:  #1 hypertension. Greatly improved today and at goal. Continue current medications #2 recent hyponatremia. Probably combination of thiazide, increased free water intake, and low dietary intake. Repeat check basic metabolic panel. If still significantly down consider discontinuing thiazide.  Recent thyroid function normal #3 mild hypokalemia. She has been increasing potassium rich foods past few days. Check basic metabolic panel

## 2012-07-10 NOTE — Patient Instructions (Addendum)
Hyponatremia   Hyponatremia is when the amount of salt (sodium) in your blood is too low. When sodium levels are low, your cells will absorb extra water and swell. The swelling happens throughout the body, but it mostly affects the brain. Severe brain swelling (cerebral edema), seizures, or coma can happen.   CAUSES    Heart, kidney, or liver problems.   Thyroid problems.   Adrenal gland problems.   Severe vomiting and diarrhea.   Certain medicines or illegal drugs.   Dehydration.   Drinking too much water.   Low-sodium diet.  SYMPTOMS    Nausea and vomiting.   Confusion.   Lethargy.   Agitation.   Headache.   Twitching or shaking (seizures).   Unconsciousness.   Appetite loss.   Muscle weakness and cramping.  DIAGNOSIS   Hyponatremia is identified by a simple blood test. Your caregiver will perform a history and physical exam to try to find the cause and type of hyponatremia. Other tests may be needed to measure the amount of sodium in your blood and urine.  TREATMENT   Treatment will depend on the cause.    Fluids may be given through the vein (IV).   Medicines may be used to correct the sodium imbalance. If medicines are causing the problem, they will need to be adjusted.   Water or fluid intake may be restricted to restore proper balance.  The speed of correcting the sodium problem is very important. If the problem is corrected too fast, nerve damage (sometimes unchangeable) can happen.  HOME CARE INSTRUCTIONS    Only take medicines as directed by your caregiver. Many medicines can make hyponatremia worse. Discuss all your medicines with your caregiver.   Carefully follow any recommended diet, including any fluid restrictions.   You may be asked to repeat lab tests. Follow these directions.   Avoid alcohol and recreational drugs.  SEEK MEDICAL CARE IF:    You develop worsening nausea, fatigue, headache, confusion, or weakness.   Your original hyponatremia symptoms return.   You have  problems following the recommended diet.  SEEK IMMEDIATE MEDICAL CARE IF:    You have a seizure.   You faint.   You have ongoing diarrhea or vomiting.  MAKE SURE YOU:    Understand these instructions.   Will watch your condition.   Will get help right away if you are not doing well or get worse.  Document Released: 02/02/2002 Document Revised: 05/07/2011 Document Reviewed: 07/30/2010  [employer] Patient Information 2013 [employer]

## 2012-08-11 ENCOUNTER — Ambulatory Visit: Payer: Medicare Other | Admitting: Family Medicine

## 2012-08-18 ENCOUNTER — Encounter: Payer: Self-pay | Admitting: Family Medicine

## 2012-08-18 ENCOUNTER — Ambulatory Visit (INDEPENDENT_AMBULATORY_CARE_PROVIDER_SITE_OTHER): Payer: Medicare Other | Admitting: Family Medicine

## 2012-08-18 ENCOUNTER — Ambulatory Visit (INDEPENDENT_AMBULATORY_CARE_PROVIDER_SITE_OTHER): Payer: Medicare Other | Admitting: Ophthalmology

## 2012-08-18 VITALS — BP 106/62 | HR 74 | Temp 97.9°F | Resp 14 | Wt 153.0 lb

## 2012-08-18 DIAGNOSIS — I10 Essential (primary) hypertension: Secondary | ICD-10-CM

## 2012-08-18 DIAGNOSIS — H43819 Vitreous degeneration, unspecified eye: Secondary | ICD-10-CM

## 2012-08-18 DIAGNOSIS — E871 Hypo-osmolality and hyponatremia: Secondary | ICD-10-CM

## 2012-08-18 DIAGNOSIS — D3130 Benign neoplasm of unspecified choroid: Secondary | ICD-10-CM

## 2012-08-18 DIAGNOSIS — H35039 Hypertensive retinopathy, unspecified eye: Secondary | ICD-10-CM

## 2012-08-18 DIAGNOSIS — H2510 Age-related nuclear cataract, unspecified eye: Secondary | ICD-10-CM

## 2012-08-18 DIAGNOSIS — E785 Hyperlipidemia, unspecified: Secondary | ICD-10-CM

## 2012-08-18 DIAGNOSIS — E876 Hypokalemia: Secondary | ICD-10-CM

## 2012-08-18 LAB — LIPID PANEL
HDL: 61.5 mg/dL (ref >39.00)
LDL Cholesterol: 83 mg/dL (ref 0–99)
Total CHOL/HDL Ratio: 3
Triglycerides: 97 mg/dL (ref 0.0–149.0)

## 2012-08-18 LAB — BASIC METABOLIC PANEL
CO2: 26 mEq/L (ref 19–32)
Calcium: 9.2 mg/dL (ref 8.4–10.5)
Creatinine, Ser: 0.6 mg/dL (ref 0.4–1.2)

## 2012-08-18 LAB — HEPATIC FUNCTION PANEL
Bilirubin, Direct: 0 mg/dL (ref 0.0–0.3)
Total Bilirubin: 0.7 mg/dL (ref 0.3–1.2)
Total Protein: 7.5 g/dL (ref 6.0–8.3)

## 2012-08-18 NOTE — Progress Notes (Signed)
  Subjective:    Patient ID: Cassandra Foster, female    DOB: 1940/10/19, 72 y.o.   MRN: [id number]  HPI Patient here for routine followup Sister recently diagnosed with ovarian cancer. Sister lives near [place]. Patient had some recent hyponatremia and hypokalemia. She has long history of constipation and increased free water intake- sometimes over 1 gallon per day. She is also very conscious to watch her sodium intake. We told her to liberalize her sodium somewhat and also to limit her free water intake.   Patient had recent normal TSH. Generally gets high fiber diet. Colonoscopy 2010. Positive family history of colon cancer brother. Patient has not any recent hematochezia or stool changes - constipation is more chronic  Hyperlipidemia treated with pravastatin. No history of CAD or peripheral vascular disease.  Past Medical History  Diagnosis Date  . Colon polyps   . Hyperlipidemia   . Hypertension   . Osteoporosis   . Chronic insomnia    Past Surgical History  Procedure Laterality Date  . Breast surgery      1985, 1997  . Knee surgery  2009    kneecap  . Other surgical history      childbirth x 3    reports that she has never smoked. She does not have any smokeless tobacco history on file. She reports that she does not drink alcohol or use illicit drugs. family history includes Cancer in her mother and Stroke in her father. Allergies  Allergen Reactions  . Simvastatin Other (See Comments)    Body aches      Review of Systems  Constitutional: Negative for chills, appetite change, fatigue and unexpected weight change.  Eyes: Negative for visual disturbance.  Respiratory: Negative for cough, chest tightness, shortness of breath and wheezing.   Cardiovascular: Negative for chest pain, palpitations and leg swelling.  Neurological: Negative for dizziness, seizures, syncope, weakness, light-headedness and headaches.       Objective:   Physical Exam  Constitutional:  She appears well-developed and well-nourished.  Neck: Neck supple. No thyromegaly present.  Cardiovascular: Normal rate and regular rhythm.   Pulmonary/Chest: Effort normal and breath sounds normal. No respiratory distress. She has no wheezes. She has no rales.  Musculoskeletal: She exhibits no edema.          Assessment & Plan:  #1 hypertension. Stable. #2 history of electrolyte disturbance with hyponatremia and hypokalemia. Recheck basic metabolic panel #3 hyperlipidemia. Check lipid and hepatic panel

## 2012-11-25 ENCOUNTER — Other Ambulatory Visit: Payer: Self-pay

## 2012-11-25 DIAGNOSIS — I10 Essential (primary) hypertension: Secondary | ICD-10-CM

## 2012-11-25 MED ORDER — LOSARTAN POTASSIUM-HCTZ 100-12.5 MG PO TABS: 1.0000 | ORAL_TABLET | Freq: Every day | ORAL | Status: DC

## 2012-12-19 ENCOUNTER — Ambulatory Visit (INDEPENDENT_AMBULATORY_CARE_PROVIDER_SITE_OTHER): Payer: Medicare Other

## 2012-12-19 DIAGNOSIS — Z23 Encounter for immunization: Secondary | ICD-10-CM

## 2013-01-21 ENCOUNTER — Telehealth: Payer: Self-pay | Admitting: Family Medicine

## 2013-01-21 NOTE — Telephone Encounter (Signed)
Patient Information:  Caller Name: Aleece  Phone: [phone]  Patient: Cassandra Foster, Cassandra Foster  Gender: Female  DOB: 04-06-1940  Age: 72 Years  PCP: Evelena Peat [facility])  Office Follow Up:  Does the office need to follow up with this patient?: Yes  Instructions For The Office: Call back with MD answer to question please  RN Note:  Some of the exposed persons have been riding in [facility] car for several weeks. Gracielynn and husband have no symptoms.  Suggested appointment for TB testing for Shalandra due to anxiety over TB.  Extended family members exposed to person with TB should notify their health cae providers for instructions.   Reviewed Health Information Advisor information on TB.  Since TB can be latent and asymptomatic, RN unable to provide total reassurance to caller who is asking for definite answer if should cancel Thanksgiving at her home. Please discuss with MD and call her back.  Symptoms  Reason For Call & Symptoms: Called with questions about TB. Mother in law of great grandchild who is also the sitter was diagnosed 01/20/13 with TB.  Family is coming for Thanksgiving.  Son frequently drives the grand child and is often at mother in law's home for meals.  Taya is worried about if she is at risk and if extended family should be coming.  Reviewed Health History In EMR: Yes  Reviewed Medications In EMR: Yes  Reviewed Allergies In EMR: Yes  Reviewed Surgeries / Procedures: Yes  Date of Onset of Symptoms: 01/20/2013  Guideline(s) Used:  No Protocol Available - Information Only  Infection Exposure Questions  Disposition Per Guideline:   Home Care  Reason For Disposition Reached:   [facility] answers caller's question about contagious period  Advice Given:  N/A  RN Overrode Recommendation:  Follow Up With Office Later  Call back requested.

## 2013-01-21 NOTE — Telephone Encounter (Signed)
Pt informed

## 2013-01-21 NOTE — Telephone Encounter (Signed)
She has many quustions and cannot adequately answer with the info given.  Generally, TB is not spread casually.  Close contact with someone who is untreated over a prolonged period obviously increases risk.  She needs to make follow up to evaluate.

## 2013-02-27 ENCOUNTER — Other Ambulatory Visit: Payer: Self-pay

## 2013-02-27 MED ORDER — PRAVASTATIN SODIUM 20 MG PO TABS: 20.0000 mg | ORAL_TABLET | Freq: Every day | ORAL | Status: DC

## 2013-02-27 MED ORDER — AMLODIPINE BESYLATE 5 MG PO TABS: 5.0000 mg | ORAL_TABLET | Freq: Every day | ORAL | Status: DC

## 2013-04-06 ENCOUNTER — Encounter: Payer: Self-pay | Admitting: Family Medicine

## 2013-04-06 ENCOUNTER — Other Ambulatory Visit: Payer: Self-pay

## 2013-04-06 ENCOUNTER — Emergency Department (HOSPITAL_COMMUNITY): Payer: Medicare Other

## 2013-04-06 ENCOUNTER — Emergency Department (HOSPITAL_COMMUNITY)
Admission: EM | Admit: 2013-04-06 | Discharge: 2013-04-06 | Disposition: A | Discharge status: Home / Self Care | Payer: Medicare Other | Attending: Emergency Medicine | Admitting: Emergency Medicine

## 2013-04-06 ENCOUNTER — Ambulatory Visit (INDEPENDENT_AMBULATORY_CARE_PROVIDER_SITE_OTHER): Payer: Medicare Other | Admitting: Family Medicine

## 2013-04-06 ENCOUNTER — Encounter (HOSPITAL_COMMUNITY): Payer: Self-pay | Admitting: Emergency Medicine

## 2013-04-06 VITALS — BP 128/80 | Temp 98.9°F | Wt 157.0 lb

## 2013-04-06 DIAGNOSIS — R209 Unspecified disturbances of skin sensation: Secondary | ICD-10-CM | POA: Insufficient documentation

## 2013-04-06 DIAGNOSIS — Z860100 Personal history of colon polyps, unspecified: Secondary | ICD-10-CM | POA: Insufficient documentation

## 2013-04-06 DIAGNOSIS — R51 Headache: Secondary | ICD-10-CM

## 2013-04-06 DIAGNOSIS — E871 Hypo-osmolality and hyponatremia: Secondary | ICD-10-CM

## 2013-04-06 DIAGNOSIS — I10 Essential (primary) hypertension: Secondary | ICD-10-CM | POA: Insufficient documentation

## 2013-04-06 DIAGNOSIS — H539 Unspecified visual disturbance: Secondary | ICD-10-CM

## 2013-04-06 DIAGNOSIS — E785 Hyperlipidemia, unspecified: Secondary | ICD-10-CM | POA: Insufficient documentation

## 2013-04-06 DIAGNOSIS — G4700 Insomnia, unspecified: Secondary | ICD-10-CM | POA: Insufficient documentation

## 2013-04-06 DIAGNOSIS — Z7982 Long term (current) use of aspirin: Secondary | ICD-10-CM | POA: Insufficient documentation

## 2013-04-06 DIAGNOSIS — Z79899 Other long term (current) drug therapy: Secondary | ICD-10-CM | POA: Insufficient documentation

## 2013-04-06 DIAGNOSIS — M810 Age-related osteoporosis without current pathological fracture: Secondary | ICD-10-CM | POA: Insufficient documentation

## 2013-04-06 DIAGNOSIS — H538 Other visual disturbances: Secondary | ICD-10-CM | POA: Insufficient documentation

## 2013-04-06 DIAGNOSIS — R519 Headache, unspecified: Secondary | ICD-10-CM | POA: Diagnosis present

## 2013-04-06 DIAGNOSIS — H5710 Ocular pain, unspecified eye: Secondary | ICD-10-CM | POA: Insufficient documentation

## 2013-04-06 LAB — POCT I-STAT, CHEM 8
BUN: 11 mg/dL (ref 6–23)
CREATININE: 0.8 mg/dL (ref 0.50–1.10)
Calcium, Ion: 1.15 mmol/L (ref 1.13–1.30)
Chloride: 93 mEq/L — ABNORMAL LOW (ref 96–112)
GLUCOSE: 91 mg/dL (ref 70–99)
HCT: 42 % (ref 36.0–46.0)
Hemoglobin: 14.3 g/dL (ref 12.0–15.0)
Potassium: 3.6 mEq/L — ABNORMAL LOW (ref 3.7–5.3)
Sodium: 129 mEq/L — ABNORMAL LOW (ref 137–147)
TCO2: 24 mmol/L (ref 0–100)

## 2013-04-06 LAB — TROPONIN I

## 2013-04-06 LAB — COMPREHENSIVE METABOLIC PANEL
ALT: 27 U/L (ref 0–35)
AST: 28 U/L (ref 0–37)
Albumin: 4.5 g/dL (ref 3.5–5.2)
Alkaline Phosphatase: 90 U/L (ref 39–117)
BUN: 12 mg/dL (ref 6–23)
CALCIUM: 9.1 mg/dL (ref 8.4–10.5)
CO2: 22 meq/L (ref 19–32)
CREATININE: 0.65 mg/dL (ref 0.50–1.10)
Chloride: 89 mEq/L — ABNORMAL LOW (ref 96–112)
GFR, EST NON AFRICAN AMERICAN: 87 mL/min — AB (ref >90)
GLUCOSE: 91 mg/dL (ref 70–99)
Potassium: 3.8 mEq/L (ref 3.7–5.3)
Sodium: 127 mEq/L — ABNORMAL LOW (ref 137–147)
TOTAL PROTEIN: 7.4 g/dL (ref 6.0–8.3)
Total Bilirubin: 0.5 mg/dL (ref 0.3–1.2)

## 2013-04-06 LAB — GLUCOSE, CAPILLARY: Glucose-Capillary: 77 mg/dL (ref 70–99)

## 2013-04-06 LAB — PROTIME-INR
INR: 0.94 (ref 0.00–1.49)
Prothrombin Time: 12.4 seconds (ref 11.6–15.2)

## 2013-04-06 LAB — CBC
HEMATOCRIT: 38.1 % (ref 36.0–46.0)
Hemoglobin: 13.8 g/dL (ref 12.0–15.0)
MCH: 31.7 pg (ref 26.0–34.0)
MCHC: 36.2 g/dL — ABNORMAL HIGH (ref 30.0–36.0)
MCV: 87.6 fL (ref 78.0–100.0)
Platelets: 151 10*3/uL (ref 150–400)
RBC: 4.35 MIL/uL (ref 3.87–5.11)
RDW: 12.2 % (ref 11.5–15.5)
WBC: 9.4 10*3/uL (ref 4.0–10.5)

## 2013-04-06 LAB — DIFFERENTIAL
Basophils Absolute: 0 10*3/uL (ref 0.0–0.1)
Basophils Relative: 0 % (ref 0–1)
EOS ABS: 0.3 10*3/uL (ref 0.0–0.7)
EOS PCT: 3 % (ref 0–5)
LYMPHS ABS: 3.4 10*3/uL (ref 0.7–4.0)
LYMPHS PCT: 36 % (ref 12–46)
MONO ABS: 1.1 10*3/uL — AB (ref 0.1–1.0)
Monocytes Relative: 12 % (ref 3–12)
Neutro Abs: 4.6 10*3/uL (ref 1.7–7.7)
Neutrophils Relative %: 49 % (ref 43–77)

## 2013-04-06 LAB — APTT: aPTT: 31 seconds (ref 24–37)

## 2013-04-06 MED ORDER — DEXAMETHASONE SODIUM PHOSPHATE 10 MG/ML IJ SOLN
10.0000 mg | Freq: Once | INTRAMUSCULAR | Status: AC
  Administered 2013-04-06: 10 mg via INTRAVENOUS
  Filled 2013-04-06: qty 1

## 2013-04-06 MED ORDER — METOCLOPRAMIDE HCL 5 MG/ML IJ SOLN
10.0000 mg | Freq: Once | INTRAMUSCULAR | Status: AC
  Administered 2013-04-06: 10 mg via INTRAVENOUS
  Filled 2013-04-06: qty 2

## 2013-04-06 MED ORDER — HALOPERIDOL LACTATE 5 MG/ML IJ SOLN: 2.0000 mg | Freq: Once | INTRAMUSCULAR | Status: DC

## 2013-04-06 MED ORDER — DIPHENHYDRAMINE HCL 50 MG/ML IJ SOLN
25.0000 mg | Freq: Once | INTRAMUSCULAR | Status: AC
  Administered 2013-04-06: 25 mg via INTRAVENOUS
  Filled 2013-04-06: qty 1

## 2013-04-06 MED ORDER — SODIUM CHLORIDE 0.9 % IV BOLUS (SEPSIS)
1000.0000 mL | Freq: Once | INTRAVENOUS | Status: AC
  Administered 2013-04-06: 1000 mL via INTRAVENOUS

## 2013-04-06 NOTE — Code Documentation (Signed)
73yo female arriving to [facility] via private vehicle.  Patient reporting severe headache with onset last night at 1900 and then inability to read out of her right eye at 1330 today.  She also reports right arm tingling.  Patient has a history of visual issues in her right eye.  Code stroke called in the ED.  Patient taken to CT.  NIHSS 1 on arrival, see documentation for details.  Vision improved, but patient continues to report right arm tingling. Patient continues to have a headache, but she reports it is improved from last night.  Patient with likely migraine per Dr. Nicole Kindred.  No acute stroke treatment per MD.  Code stroke canceled at 1657.

## 2013-04-06 NOTE — ED Notes (Signed)
CBG was 77

## 2013-04-06 NOTE — Consult Note (Signed)
Referring Physician: Tawnya Crook    Chief Complaint: blurred vision  HPI:                                                                                                                                         Cassandra Foster is an 73 y.o. female who was at church last night and noted she developed a sever HA.  The HA was located in the occipital portion of her head and throbbing.  This AM she woke up and had continued HA.  At 13:40 she was at church and noted when she was reading her bible she could not read the words well out of the right eye. Due to being afraid she may have a ruptured aneurysm she came to the ED.  Initially she did not tell ED about her HA last night and the only symptoms mentioned were the loss of vision.  Once she arrived in CT she mentioned the HA that started last night.  At time of consultation her symptoms had resolved.    Date last known well: Date: 04/05/2013 Time last known well: Time: 19:00 tPA Given: No: symptoms resolved  Past Medical History  Diagnosis Date  . Colon polyps   . Hyperlipidemia   . Hypertension   . Osteoporosis   . Chronic insomnia     Past Surgical History  Procedure Laterality Date  . Breast surgery      1985, 1997  . Knee surgery  2009    kneecap  . Other surgical history      childbirth x 3    Family History  Problem Relation Age of Onset  . Cancer Mother     breast  . Stroke Father    Social History:  reports that she has never smoked. She does not have any smokeless tobacco history on file. She reports that she does not drink alcohol or use illicit drugs.  Allergies:  Allergies  Allergen Reactions  . Simvastatin Other (See Comments)    Body aches    Medications:                                                                                                                           No current facility-administered medications for this encounter.   Current Outpatient Prescriptions  Medication Sig Dispense Refill  .  amLODipine (NORVASC) 5 MG tablet  Take 1 tablet (5 mg total) by mouth daily.  90 tablet  1  . aspirin 325 MG tablet Take 162.5 mg by mouth daily.      . ATELVIA 35 MG TBEC Take 35 mg by mouth once a week. *Thursdays      . Cholecalciferol (VITAMIN D3) 5000 UNITS CAPS Take 1 capsule by mouth daily.      . fish oil-omega-3 fatty acids 1000 MG capsule Take 2 g by mouth daily.       Marland Kitchen losartan-hydrochlorothiazide (HYZAAR) 100-12.5 MG per tablet Take 1 tablet by mouth daily.  90 tablet  3  . Magnesium 500 MG CAPS Take 1 capsule by mouth daily.       . Multiple Vitamin (MULTIVITAMIN) tablet Take 1 tablet by mouth daily.        . pravastatin (PRAVACHOL) 20 MG tablet Take 1 tablet (20 mg total) by mouth daily.  30 tablet  5  . Soft Lens Products (REWETTING DROPS) SOLN Apply 1 drop to eye daily.      . traZODone (DESYREL) 50 MG tablet Take 100 mg by mouth at bedtime. 2 tabs at night time         ROS:                                                                                                                                       History obtained from the patient  General ROS: negative for - chills, fatigue, fever, night sweats, weight gain or weight loss Psychological ROS: negative for - behavioral disorder, hallucinations, memory difficulties, mood swings or suicidal ideation Ophthalmic ROS: negative for - blurry vision, double vision, eye pain or loss of vision ENT ROS: negative for - epistaxis, nasal discharge, oral lesions, sore throat, tinnitus or vertigo Allergy and Immunology ROS: negative for - hives or itchy/watery eyes Hematological and Lymphatic ROS: negative for - bleeding problems, bruising or swollen lymph nodes Endocrine ROS: negative for - galactorrhea, hair pattern changes, polydipsia/polyuria or temperature intolerance Respiratory ROS: negative for - cough, hemoptysis, shortness of breath or wheezing Cardiovascular ROS: negative for - chest pain, dyspnea on exertion, edema or  irregular heartbeat Gastrointestinal ROS: negative for - abdominal pain, diarrhea, hematemesis, nausea/vomiting or stool incontinence Genito-Urinary ROS: negative for - dysuria, hematuria, incontinence or urinary frequency/urgency Musculoskeletal ROS: negative for - joint swelling or muscular weakness Neurological ROS: as noted in HPI Dermatological ROS: negative for rash and skin lesion changes  Neurologic Examination:  Blood pressure 160/73, pulse 74, temperature 97.8 F (36.6 C), temperature source Oral, resp. rate 20, height 5\' 1"  (1.549 m), weight 70.761 kg (156 lb), SpO2 99.00%.  Mental Status: Alert, oriented, thought content appropriate.  Speech fluent without evidence of aphasia.  Able to follow 3 step commands without difficulty. Cranial Nerves: II: Discs flat bilaterally; Visual fields grossly normal, pupils equal, round, reactive to light and accommodation III,IV, VI: ptosis not present, extra-ocular motions intact bilaterally V,VII: smile symmetric, facial light touch sensation normal bilaterally VIII: hearing normal bilaterally IX,X: gag reflex present XI: bilateral shoulder shrug XII: midline tongue extension without atrophy or fasciculations  Motor: Right : Upper extremity   5/5    Left:     Upper extremity   5/5  Lower extremity   5/5     Lower extremity   5/5 Tone and bulk:normal tone throughout; no atrophy noted Sensory: Pinprick and light touch intact throughout, bilaterally Deep Tendon Reflexes:  Right: Upper Extremity   Left: Upper extremity   biceps (C-5 to C-6) 2/4   biceps (C-5 to C-6) 2/4 tricep (C7) 2/4    triceps (C7) 2/4 Brachioradialis (C6) 2/4  Brachioradialis (C6) 2/4  Lower Extremity Lower Extremity  quadriceps (L-2 to L-4) 2/4   quadriceps (L-2 to L-4) 2/4 Achilles (S1) 2/4   Achilles (S1) 2/4  Plantars: Right: downgoing   Left:  downgoing Cerebellar: normal finger-to-nose,  normal heel-to-shin test Gait: not tested due to multiple leads CV: pulses palpable throughout    Lab Results: Basic Metabolic Panel:  Recent Labs Lab 04/06/13 1648  NA 129*  K 3.6*  CL 93*  GLUCOSE 91  BUN 11  CREATININE 0.80    Liver Function Tests: No results found for this basename: AST, ALT, ALKPHOS, BILITOT, PROT, ALBUMIN,  in the last 168 hours No results found for this basename: LIPASE, AMYLASE,  in the last 168 hours No results found for this basename: AMMONIA,  in the last 168 hours  CBC:  Recent Labs Lab 04/06/13 1629 04/06/13 1648  WBC 9.4  --   NEUTROABS 4.6  --   HGB 13.8 14.3  HCT 38.1 42.0  MCV 87.6  --   PLT 151  --     Cardiac Enzymes: No results found for this basename: CKTOTAL, CKMB, CKMBINDEX, TROPONINI,  in the last 168 hours  Lipid Panel: No results found for this basename: CHOL, TRIG, HDL, CHOLHDL, VLDL, LDLCALC,  in the last 168 hours  CBG: No results found for this basename: GLUCAP,  in the last 168 hours  Microbiology: No results found for this or any previous visit.  Coagulation Studies: No results found for this basename: LABPROT, INR,  in the last 72 hours  Imaging: Ct Head (brain) Wo Contrast  04/06/2013   CLINICAL DATA:  Patient unable to see out of the right eye. Headache.  EXAM: CT HEAD WITHOUT CONTRAST  TECHNIQUE: Contiguous axial images were obtained from the base of the skull through the vertex without intravenous contrast.  COMPARISON:  CT HEAD W/O CM dated 07/06/2011  FINDINGS: There is no evidence of mass effect, midline shift or extra-axial fluid collections. There is no evidence of a space-occupying lesion or intracranial hemorrhage. There is no evidence of a cortical-based area of acute infarction.  The ventricles and sulci are appropriate for the patient's age. The basal cisterns are patent.  Visualized portions of the orbits are unremarkable. The visualized portions of the  paranasal sinuses and mastoid air cells are unremarkable.  The osseous structures are unremarkable. 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  IMPRESSION: No acute intracranial pathology.   Electronically Signed   By: Kathreen Devoid   On: 04/06/2013 16:52    Stroke Risk Factors - hyperlipidemia and hypertension   Assessment and plan discussed with with attending physician and they are in agreement.    Etta Quill PA-C [facility] [phone]  04/06/2013, 4:56 PM   Assessment: 72 y.o. female with possible complicated migraine HA.     Recommend: 1) Treat HA symptomatically 2) No further neurological intervention is indicated  I personally participate in this patient's evaluation and management, including neurological examination as well as formulating the above clinical assessment and management recommendations.  Rush Farmer M.D. [facility] [phone]

## 2013-04-06 NOTE — ED Notes (Signed)
NOTIFIED DR. DOCHERTY IN PERSON OF PATIENTS LAB RESULTS OF CHEM 8+ @ 16:55 PM ,04/06/2013.

## 2013-04-06 NOTE — ED Notes (Signed)
Pt states that she drank the juice and had no problems and ambulated in the hallway with no difficulty.

## 2013-04-06 NOTE — ED Notes (Signed)
PT reported loss of vision in RT eye Sunday night after church. Pt also reported a HA started at the same . PT denies any HX of Stroke. Pt A/O and speaking in full sentences.

## 2013-04-06 NOTE — Discharge Instructions (Signed)
Blurred Vision You have been seen today complaining of blurred vision. This means you have a loss of ability to see small details.  CAUSES  Blurred vision can be a symptom of underlying eye problems, such as:  Aging of the eye (presbyopia).  Glaucoma.  Cataracts.  Eye infection.  Eye-related migraine.  Diabetes mellitus.  Fatigue.  Migraine headaches.  High blood pressure.  Breakdown of the back of the eye (macular degeneration).  Problems caused by some medications. The most common cause of blurred vision is the need for eyeglasses or a new prescription. Today in the emergency department, no cause for your blurred vision can be found. SYMPTOMS  Blurred vision is the loss of visual sharpness and detail (acuity). DIAGNOSIS  Should blurred vision continue, you should see your caregiver. If your caregiver is your primary care physician, he or she may choose to refer you to another specialist.  TREATMENT  Do not ignore your blurred vision. Make sure to have it checked out to see if further treatment or referral is necessary. SEEK MEDICAL CARE IF:  You are unable to get into a specialist so we can help you with a referral. SEEK IMMEDIATE MEDICAL CARE IF: You have severe eye pain, severe headache, or sudden loss of vision. MAKE SURE YOU:   Understand these instructions.  Will watch your condition.  Will get help right away if you are not doing well or get worse. Document Released: 02/15/2003 Document Revised: 05/07/2011 Document Reviewed: 09/17/2007 [facility] Patient Information 2014 [place]. Migraine Headache A migraine headache is an intense, throbbing pain on one or both sides of your head. A migraine can last for 30 minutes to several hours. CAUSES  The exact cause of a migraine headache is not always known. However, a migraine may be caused when nerves in the brain become irritated and release chemicals that cause inflammation. This causes pain. Certain things  may also trigger migraines, such as:  Alcohol.  Smoking.  Stress.  Menstruation.  Aged cheeses.  Foods or drinks that contain nitrates, glutamate, aspartame, or tyramine.  Lack of sleep.  Chocolate.  Caffeine.  Hunger.  Physical exertion.  Fatigue.  Medicines used to treat chest pain (nitroglycerine), birth control pills, estrogen, and some blood pressure medicines. SIGNS AND SYMPTOMS  Pain on one or both sides of your head.  Pulsating or throbbing pain.  Severe pain that prevents daily activities.  Pain that is aggravated by any physical activity.  Nausea, vomiting, or both.  Dizziness.  Pain with exposure to bright lights, loud noises, or activity.  General sensitivity to bright lights, loud noises, or smells. Before you get a migraine, you may get warning signs that a migraine is coming (aura). An aura may include:  Seeing flashing lights.  Seeing bright spots, halos, or zig-zag lines.  Having tunnel vision or blurred vision.  Having feelings of numbness or tingling.  Having trouble talking.  Having muscle weakness. DIAGNOSIS  A migraine headache is often diagnosed based on:  Symptoms.  Physical exam.  A CT scan or MRI of your head. These imaging tests cannot diagnose migraines, but they can help rule out other causes of headaches. TREATMENT Medicines may be given for pain and nausea. Medicines can also be given to help prevent recurrent migraines.  HOME CARE INSTRUCTIONS  Only take over-the-counter or prescription medicines for pain or discomfort as directed by your health care provider. The use of long-term narcotics is not recommended.  Lie down in a dark, quiet room  when you have a migraine.  Keep a journal to find out what may trigger your migraine headaches. For example, write down:  What you eat and drink.  How much sleep you get.  Any change to your diet or medicines.  Limit alcohol consumption.  Quit smoking if you  smoke.  Get 7 9 hours of sleep, or as recommended by your health care provider.  Limit stress.  Keep lights dim if bright lights bother you and make your migraines worse. SEEK IMMEDIATE MEDICAL CARE IF:   Your migraine becomes severe.  You have a fever.  You have a stiff neck.  You have vision loss.  You have muscular weakness or loss of muscle control.  You start losing your balance or have trouble walking.  You feel faint or pass out.  You have severe symptoms that are different from your first symptoms. MAKE SURE YOU:   Understand these instructions.  Will watch your condition.  Will get help right away if you are not doing well or get worse. Document Released: 02/12/2005 Document Revised: 12/03/2012 Document Reviewed: 10/20/2012 [facility] Patient Information 2014 [place].

## 2013-04-06 NOTE — Progress Notes (Signed)
Pre visit review using our clinic review tool, if applicable. No additional management support is needed unless otherwise documented below in the visit note. 

## 2013-04-06 NOTE — ED Provider Notes (Signed)
CSN: [id number]     Arrival date & time 04/06/13  1611 History   First MD Initiated Contact with Patient 04/06/13 1637     Chief Complaint  Patient presents with  . Headache  . Eye Problem     (Consider location/radiation/quality/duration/timing/severity/associated sxs/prior Treatment) HPI Comments: Pt states she developed h/a at about 6:30PM last night, and while at church noticed blurry vision of R eye described as "fuzzy" for several hours. It resolved. She woke up this morning w/ occipital h/a that radiated to R eye and was again having fuzzy vision of just right eye.  She also reports tingling of R arm & R leg for at least several days intermittently, but that it was more pronounced today.    Patient is a 73 y.o. female presenting with headaches, eye problem, and neurologic complaint. The history is provided by the patient and a relative. No language interpreter was used.  Headache Pain location:  Occipital Quality:  Dull Radiates to:  Face Pain severity now: moderate. Pain scale at highest: moderate. Onset quality:  Unable to specify Duration:  10 hours Timing:  Constant Progression:  Waxing and waning Chronicity:  New Similar to prior headaches: no   Relieved by:  Nothing Worsened by:  Nothing tried Ineffective treatments: excedrin. Associated symptoms: blurred vision, eye pain, numbness and tingling   Associated symptoms: no abdominal pain, no back pain, no congestion, no cough, no diarrhea, no ear pain, no facial pain, no fatigue, no fever, no focal weakness, no photophobia, no seizures, no vomiting and no weakness   Eye Problem Location:  R eye Quality:  Aching Severity:  Moderate Onset quality:  Unable to specify Duration:  1 day Progression:  Waxing and waning Chronicity:  New Relieved by:  Nothing Worsened by:  Nothing tried Ineffective treatments:  None tried Associated symptoms: blurred vision, headaches and numbness   Associated symptoms: no photophobia and  no vomiting   Neurologic Problem This is a new problem. The current episode started 12 to 24 hours ago. The problem occurs constantly. The problem has been gradually improving. Associated symptoms include headaches. Pertinent negatives include no abdominal pain. Nothing aggravates the symptoms. Nothing relieves the symptoms. She has tried nothing for the symptoms. The treatment provided no relief.    Past Medical History  Diagnosis Date  . Colon polyps   . Hyperlipidemia   . Hypertension   . Osteoporosis   . Chronic insomnia    Past Surgical History  Procedure Laterality Date  . Breast surgery      1985, 1997  . Knee surgery  2009    kneecap  . Other surgical history      childbirth x 3   Family History  Problem Relation Age of Onset  . Cancer Mother     breast  . Stroke Father    History  Substance Use Topics  . Smoking status: Never Smoker   . Smokeless tobacco: Not on file  . Alcohol Use: No   OB History   Grav Para Term Preterm Abortions TAB SAB Ect Mult Living                 Review of Systems  Constitutional: Negative for fever and fatigue.  HENT: Negative for congestion and ear pain.   Eyes: Positive for blurred vision, pain and visual disturbance. Negative for photophobia.  Respiratory: Negative for cough.   Gastrointestinal: Negative for vomiting, abdominal pain and diarrhea.  Musculoskeletal: Negative for back pain.  Neurological: Positive  for numbness and headaches. Negative for focal weakness, seizures and speech difficulty.      Allergies  Simvastatin  Home Medications   Current Outpatient Rx  Name  Route  Sig  Dispense  Refill  . amLODipine (NORVASC) 5 MG tablet   Oral   Take 1 tablet (5 mg total) by mouth daily.   90 tablet   1   . aspirin 325 MG tablet   Oral   Take 162.5 mg by mouth daily.         . ATELVIA 35 MG TBEC   Oral   Take 35 mg by mouth once a week. monday         . Cholecalciferol (VITAMIN D3) 5000 UNITS CAPS    Oral   Take 1 capsule by mouth daily.         . Cyanocobalamin (VITAMIN B 12 PO)   Oral   Take 1,000 mcg by mouth daily.         . fish oil-omega-3 fatty acids 1000 MG capsule   Oral   Take 2 g by mouth daily.          Marland Kitchen losartan-hydrochlorothiazide (HYZAAR) 100-12.5 MG per tablet   Oral   Take 1 tablet by mouth daily.   90 tablet   3   . Magnesium 500 MG CAPS   Oral   Take 1 capsule by mouth daily.          . Multiple Vitamin (MULTIVITAMIN) tablet   Oral   Take 1 tablet by mouth daily.           . Multiple Vitamins-Minerals (MULTIVITAMIN WITH MINERALS) tablet   Oral   Take 1 tablet by mouth daily.         . pravastatin (PRAVACHOL) 20 MG tablet   Oral   Take 1 tablet (20 mg total) by mouth daily.   30 tablet   5   . traZODone (DESYREL) 50 MG tablet   Oral   Take 100 mg by mouth at bedtime. 2 tabs at night time          BP 125/64  Pulse 71  Temp(Src) 98.4 F (36.9 C) (Oral)  Resp 14  Ht 5\' 1"  (1.549 m)  Wt 156 lb (70.761 kg)  BMI 29.49 kg/m2  SpO2 100% Physical Exam  Constitutional: She is oriented to person, place, and time. She appears well-developed and well-nourished. No distress.  HENT:  Head: Normocephalic and atraumatic.  Mouth/Throat: No oropharyngeal exudate.  Eyes: Pupils are equal, round, and reactive to light.  Neck: Normal range of motion. Neck supple.  Cardiovascular: Normal rate, regular rhythm and normal heart sounds.  Exam reveals no gallop and no friction rub.   No murmur heard. Pulmonary/Chest: Effort normal and breath sounds normal. No respiratory distress. She has no wheezes. She has no rales.  Abdominal: Soft. Bowel sounds are normal. She exhibits no distension and no mass. There is no tenderness. There is no rebound and no guarding.  Musculoskeletal: Normal range of motion. She exhibits no edema and no tenderness.  Neurological: She is alert and oriented to person, place, and time. She has normal strength. She displays  no tremor. No cranial nerve deficit or sensory deficit. She exhibits normal muscle tone. She displays a negative Romberg sign. Coordination and gait normal. GCS eye subscore is 4. GCS verbal subscore is 5. GCS motor subscore is 6.  Reports tingling of R arm & L arm, but on neuro testing reports sensation  similar on both sides.   Skin: Skin is warm and dry.  Psychiatric: She has a normal mood and affect.    ED Course  Procedures (including critical care time) Labs Review Labs Reviewed  CBC - Abnormal; Notable for the following:    MCHC 36.2 (*)    All other components within normal limits  DIFFERENTIAL - Abnormal; Notable for the following:    Monocytes Absolute 1.1 (*)    All other components within normal limits  COMPREHENSIVE METABOLIC PANEL - Abnormal; Notable for the following:    Sodium 127 (*)    Chloride 89 (*)    GFR calc non Af Amer 87 (*)    All other components within normal limits  POCT I-STAT, CHEM 8 - Abnormal; Notable for the following:    Sodium 129 (*)    Potassium 3.6 (*)    Chloride 93 (*)    All other components within normal limits  PROTIME-INR  APTT  TROPONIN I  GLUCOSE, CAPILLARY   Imaging Review Ct Head (brain) Wo Contrast  04/06/2013   ADDENDUM REPORT: 04/06/2013 16:58  ADDENDUM: These results were called by telephone at the time of interpretation on 04/06/2013 at 4:57 PM to Dr. Nicole Kindred, who verbally acknowledged these results.   Electronically Signed   By: Kathreen Devoid   On: 04/06/2013 16:58   04/06/2013   CLINICAL DATA:  Patient unable to see out of the right eye. Headache.  EXAM: CT HEAD WITHOUT CONTRAST  TECHNIQUE: Contiguous axial images were obtained from the base of the skull through the vertex without intravenous contrast.  COMPARISON:  CT HEAD W/O CM dated 07/06/2011  FINDINGS: There is no evidence of mass effect, midline shift or extra-axial fluid collections. There is no evidence of a space-occupying lesion or intracranial hemorrhage. There is no  evidence of a cortical-based area of acute infarction.  The ventricles and sulci are appropriate for the patient's age. The basal cisterns are patent.  Visualized portions of the orbits are unremarkable. The visualized portions of the paranasal sinuses and mastoid air cells are unremarkable.  The osseous structures are unremarkable. 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  IMPRESSION: No acute intracranial pathology.  Electronically Signed: By: Kathreen Devoid On: 04/06/2013 16:52    EKG Interpretation   None       MDM   Final diagnoses:  Hyponatremia   1. Headache   2. Blurred vision, right eye   3. Hyponatremia     Pt is a 73 y.o. female with Pmhx as above who presents as code CVA with complaint of sudden onset visual loss.  Pt initally gave hx that symptoms started 1:40, then later revealed they started about 7pm yesterday.  She reports having a h/a since last night with intermittent blurry vision or R eye both last night and for several hours today.  She has also had tingling of R arm & L, states she has had this for at least several days prior, but was worse today.  Blurry vision has since resolved, but still having mild occipital h/a. CT head w/ no acute intracranial pathology.    5:35 PM Dr. Nicole Kindred w/ Neurology has seen pt, does not feel symptoms represent CVA ot TIA and does not need further neurologic w/u.  Suspect possible complex migraine. She can continue her ASA.  She is reading fine print w/ affected eye. On My neuro exam she has no objective neuro deficits and although reports tingling of  R arm, R leg, reported sensation was normal when comparing L to R.  Cardiopulm exam also benign. Will treat w/ migraine cocktail   7:24 PM h/a resolved, tingling improved. Pt has tolerate PO and ambulated w/o difficulty.  Will d/c home and rec outpt f/u for hyponatremia (chronic).       Neta Ehlers, MD 04/07/13 [phone]

## 2013-04-06 NOTE — Progress Notes (Signed)
Chief Complaint  Patient presents with  . Loss of Vision    right eye; right sided neck pain and arm pain, cannot read out of right eye     HPI:  Acute visit for headache: -HA (worst) started suddenly last night and R eye vision loss -persistent HA and blurry vision -R arm and leg feel "funny" -no hx of migraine -hx of brain aneurysm in father -denies: fever, vomiting, sinus issues, falls, dizziness, weakness  ROS: See pertinent positives and negatives per HPI.  Past Medical History  Diagnosis Date  . Colon polyps   . Hyperlipidemia   . Hypertension   . Osteoporosis   . Chronic insomnia     Past Surgical History  Procedure Laterality Date  . Breast surgery      1985, 1997  . Knee surgery  2009    kneecap  . Other surgical history      childbirth x 3    Family History  Problem Relation Age of Onset  . Cancer Mother     breast  . Stroke Father     History   Social History  . Marital Status: Married    Spouse Name: N/A    Number of Children: N/A  . Years of Education: N/A   Social History Main Topics  . Smoking status: Never Smoker   . Smokeless tobacco: None  . Alcohol Use: No  . Drug Use: No  . Sexual Activity: None   Other Topics Concern  . None   Social History Narrative  . None    Current outpatient prescriptions:amLODipine (NORVASC) 5 MG tablet, Take 1 tablet (5 mg total) by mouth daily., Disp: 90 tablet, Rfl: 1;  aspirin 325 MG tablet, Take 162.5 mg by mouth daily., Disp: , Rfl: ;  ATELVIA 35 MG TBEC, Take 35 mg by mouth once a week. *Thursdays, Disp: , Rfl: ;  Cholecalciferol (VITAMIN D3) 5000 UNITS CAPS, Take 1 capsule by mouth daily., Disp: , Rfl:  fish oil-omega-3 fatty acids 1000 MG capsule, Take 2 g by mouth daily. , Disp: , Rfl: ;  losartan-hydrochlorothiazide (HYZAAR) 100-12.5 MG per tablet, Take 1 tablet by mouth daily., Disp: 90 tablet, Rfl: 3;  Magnesium 500 MG CAPS, Take 1 capsule by mouth daily. , Disp: , Rfl: ;  Multiple Vitamin  (MULTIVITAMIN) tablet, Take 1 tablet by mouth daily.  , Disp: , Rfl:  pravastatin (PRAVACHOL) 20 MG tablet, Take 1 tablet (20 mg total) by mouth daily., Disp: 30 tablet, Rfl: 5;  Soft Lens Products (REWETTING DROPS) SOLN, Apply 1 drop to eye daily., Disp: , Rfl: ;  traZODone (DESYREL) 50 MG tablet, Take 100 mg by mouth at bedtime. 2 tabs at night time, Disp: , Rfl:   EXAM:  Filed Vitals:   04/06/13 1531  BP: 128/80  Temp: 98.9 F (37.2 C)    Body mass index is 28.24 kg/(m^2).  GENERAL: vitals reviewed and listed above, alert, oriented, appears well hydrated and in no acute distress  HEENT: atraumatic, pupils equal and round, visual acuity not intact on R,  R pupil ? A little sluggish, conjunttiva clear, no obvious abnormalities on inspection of external nose and ears  NECK: no obvious masses on inspection  LUNGS: clear to auscultation bilaterally, no wheezes, rales or rhonchi, good air movement  CV: HRRR, no peripheral edema  MS: moves all extremities without noticeable abnormality  PSYCH: pleasant and cooperative, no obvious depression or anxiety, seems slightly discombobulated  NEURO: vision decreased R eye, otherwise  CN grossly intact, normal gait, normal strength throughout  ASSESSMENT AND PLAN:  Discussed the following assessment and plan:  Headache(784.0)  Change in vision  -we discussed possible serious and likely etiologies, workup and treatment, treatment risks and return precautions -after this discussion, Arnice opted for evaluation in ED but refused EMS transport that we advised -my assistant to call and notify ED -of course, we advised Molly  to return or notify a doctor immediately if symptoms worsen or persist or new concerns arise.   -Patient advised to return or notify a doctor immediately if symptoms worsen or persist or new concerns arise.  There are no Patient Instructions on file for this visit.   Colin Benton R.

## 2013-04-06 NOTE — ED Notes (Addendum)
Pt from MD office for HA since yesterday and pain into back of head, neck and jaw on the right side. States she couldn't read anything on the paper with her right eye today but the left eye is okay. Problems with her eye started at 1340. States her lips felt numb but is gone now. No facial droop of problems with her speech at this time. Numbness and tingling to the right side.

## 2013-04-06 NOTE — ED Notes (Signed)
Patient to ED with C/O tingling in her right arm and leg at least since yesterday.  Also C/O pain and visual changes in her left eye.

## 2013-04-08 ENCOUNTER — Ambulatory Visit (INDEPENDENT_AMBULATORY_CARE_PROVIDER_SITE_OTHER): Payer: Medicare Other | Admitting: Family Medicine

## 2013-04-08 ENCOUNTER — Encounter: Payer: Self-pay | Admitting: Family Medicine

## 2013-04-08 VITALS — BP 128/78 | HR 75 | Temp 98.0°F | Wt 154.0 lb

## 2013-04-08 DIAGNOSIS — H538 Other visual disturbances: Secondary | ICD-10-CM

## 2013-04-08 DIAGNOSIS — R51 Headache: Secondary | ICD-10-CM

## 2013-04-08 NOTE — Patient Instructions (Signed)
Follow up promptly for any worsening headache or increased loss of vision.

## 2013-04-08 NOTE — Progress Notes (Signed)
Subjective:    Patient ID: Cassandra Foster, female    DOB: Jul 20, 1940, 73 y.o.   MRN: [id number]  HPI Patient is seen for ER followup. She presented there for evaluation on 04/06/2013. She relates that she was at church the night before when she developed severe headache. This was described as occipital and throbbing. The morning in ER visit she had some persistent headache and around 1:30 in the afternoon she was reading her Bible and had difficulties reading out of her right eye. She felt like she may have had some slightly decreased visual acuity left as well. She denied any focal weakness or any confusion. No speech changes. She presented to emergency department. She had CT of head which showed no acute abnormalities. Labs revealed sodium 129. Potassium 3.6. Other labs unremarkable. Patient was diagnosed with a" possible complicated migraine headache". She denies any prior history of headaches.  Her headache is slightly improved but she still has some vague nondescript headache. She also describes that she had some right-sided headache last week even prior to her severe episode. She feels that her visual acuity is decreased right greater than left. No focal weakness. Occasional tingling sensation right side of body. No confusion. No slurred speech.  Past Medical History  Diagnosis Date  . Colon polyps   . Hyperlipidemia   . Hypertension   . Osteoporosis   . Chronic insomnia    Past Surgical History  Procedure Laterality Date  . Breast surgery      1985, 1997  . Knee surgery  2009    kneecap  . Other surgical history      childbirth x 3    reports that she has never smoked. She does not have any smokeless tobacco history on file. She reports that she does not drink alcohol or use illicit drugs. family history includes Cancer in her mother; Stroke in her father. Allergies  Allergen Reactions  . Simvastatin Other (See Comments)    Body aches      Review of Systems    Constitutional: Negative for fever, chills, appetite change and unexpected weight change.  Eyes: Positive for visual disturbance. Negative for photophobia, discharge and redness.  Respiratory: Negative for cough and shortness of breath.   Cardiovascular: Negative for chest pain.  Neurological: Positive for headaches. Negative for seizures, syncope and speech difficulty.  Psychiatric/Behavioral: Negative for confusion.       Objective:   Physical Exam  Constitutional: She is oriented to person, place, and time. She appears well-developed and well-nourished.  HENT:  No localized tenderness in the scalp region  Eyes: Pupils are equal, round, and reactive to light.  Fundi reveal no acute changes  Cardiovascular: Normal rate.   Pulmonary/Chest: Effort normal and breath sounds normal. No respiratory distress. She has no wheezes. She has no rales.  Musculoskeletal: She exhibits no edema.  Neurological: She is alert and oriented to person, place, and time. No cranial nerve deficit. Coordination normal.  Gait is normal. No focal weakness  Skin: No rash noted.  Psychiatric: She has a normal mood and affect. Her behavior is normal. Judgment and thought content normal.          Assessment & Plan:  Patient presents with recent severe headache along with subjective decreased vision right eye. Migraine would seem unlikely at her age with no prior history. Check sedimentation rate though she does not have any temporal artery tenderness. Check visual acuity. Consider followup with ophthalmologist. Consider MRI brain if symptoms  persist.

## 2013-04-08 NOTE — Progress Notes (Signed)
Pre visit review using our clinic review tool, if applicable. No additional management support is needed unless otherwise documented below in the visit note. 

## 2013-04-09 LAB — SEDIMENTATION RATE: Sed Rate: 4 mm/hr (ref 0–22)

## 2013-05-11 LAB — HM MAMMOGRAPHY

## 2013-06-01 ENCOUNTER — Other Ambulatory Visit: Payer: Self-pay | Admitting: Family Medicine

## 2013-06-03 ENCOUNTER — Ambulatory Visit: Payer: Medicare Other | Admitting: Family Medicine

## 2013-06-17 ENCOUNTER — Encounter (INDEPENDENT_AMBULATORY_CARE_PROVIDER_SITE_OTHER): Payer: Medicare Other | Admitting: Ophthalmology

## 2013-06-17 DIAGNOSIS — H33309 Unspecified retinal break, unspecified eye: Secondary | ICD-10-CM

## 2013-06-17 DIAGNOSIS — I10 Essential (primary) hypertension: Secondary | ICD-10-CM

## 2013-06-17 DIAGNOSIS — H3530 Unspecified macular degeneration: Secondary | ICD-10-CM

## 2013-06-17 DIAGNOSIS — D3130 Benign neoplasm of unspecified choroid: Secondary | ICD-10-CM

## 2013-06-17 DIAGNOSIS — H43819 Vitreous degeneration, unspecified eye: Secondary | ICD-10-CM

## 2013-06-17 DIAGNOSIS — H35039 Hypertensive retinopathy, unspecified eye: Secondary | ICD-10-CM

## 2013-06-18 ENCOUNTER — Encounter: Payer: Self-pay | Admitting: Family Medicine

## 2013-06-18 ENCOUNTER — Ambulatory Visit (INDEPENDENT_AMBULATORY_CARE_PROVIDER_SITE_OTHER): Payer: Medicare Other | Admitting: Family Medicine

## 2013-06-18 VITALS — BP 128/68 | HR 74 | Temp 97.5°F | Wt 159.0 lb

## 2013-06-18 DIAGNOSIS — M25511 Pain in right shoulder: Secondary | ICD-10-CM

## 2013-06-18 DIAGNOSIS — M25519 Pain in unspecified shoulder: Secondary | ICD-10-CM

## 2013-06-18 NOTE — Patient Instructions (Addendum)
Follow up in 2 weeks. Follow up immediately if there is weakness or numbness in your arm. Come back anytime if your pain is getting worse. Continue alternating heat and ice. Do not use sling more than 2 weeks.

## 2013-06-18 NOTE — Progress Notes (Signed)
Pre visit review using our clinic review tool, if applicable. No additional management support is needed unless otherwise documented below in the visit note. 

## 2013-06-18 NOTE — Progress Notes (Signed)
   Subjective:    Patient ID: Cassandra Foster, female    DOB: [birth year], 73 y.o.   MRN: [id number]  HPI Comments: Patient is a 73 year old female presenting with complaints of right shoulder and neck pain x 6 days.   The pain began Saturday while doing yard work and heavy lifting. She "ruptured" her left eye with associated lighting flashes and spider webs. She was seen and had laser surgery yesterday performed by Dr. Zigmund Daniel. No visual problems at this time. She is taking eye drops but no new medication by mouth.   The shoulder and neck pain began when she lifted the 40 pound bag of concrete doing yard work. She did not hear a pop. She has not been seen previously for the shoulder or neck pain. She bought a sling at [facility] yesterday and began wearing to relieve the pain. The pain is described as achy and began in her shoulder, with progressive intensity. Patient states it goes up to her neck but without radiation to her arm. Aggravating factors include raising her arm and moving her shoulder joint. Patient reports swelling. She has been resting and elevating her arm and has also alternated iced with heat with minimal relief. She did try ibuprofen once. Patient requests information about what to do to take care of it. She states she Is not interested in taking pain medication because she doesn't believe in it. Patient has never injured or had surgery on this shoulder.       Review of Systems  Constitutional: Negative for fever, activity change and fatigue.  Respiratory: Negative for choking.   Cardiovascular: Negative for chest pain.  Neurological: Negative for dizziness, syncope and headaches.       Objective:   Physical Exam  Constitutional: She appears well-developed and well-nourished.  Neck: Normal range of motion.  Cardiovascular: Normal rate, normal heart sounds and intact distal pulses.   Pulmonary/Chest: Effort normal and breath sounds normal.  Musculoskeletal:       Right  shoulder: She exhibits tenderness, swelling and pain. She exhibits normal range of motion, no bony tenderness, normal pulse and normal strength.  Pain illicited upon abduction of arm. No pain upon internal or external rotation.   Neurological: She is alert.  Reflex Scores:      Bicep reflexes are 2+ on the right side and 2+ on the left side. Skin: Skin is warm and dry.        Assessment & Plan:  #1 Right Shoulder Pain  Differential includes mild rotator cuff strain, tendonitis or bursitis, or cervical radiculopathy. No X-ray because no history of fall and physical exam does not suggest bone involvement. Less suspicious of cervical radiculopathy because presence of swelling and lack of radiation down right arm. More suspicious for a tendonitis, bursitis, or partial rotator cuff tear. Patient would like to give time for healing before considering an injection. Patient advised to continue alternating heat and ice, use ibuprofen as needed to manage discomfort, and to continue using sling but not to continue use for more than 2 weeks to prevent a frozen shoulder.   Cook, PA-S  Agree with above.  Does describe some radiation down arm but mostly shoulder pain.  Doubt full thickness rotator cuff tear.  Consider steroid injection if no better in 2 weeks.  Carolann Littler, MD

## 2013-06-29 ENCOUNTER — Ambulatory Visit (INDEPENDENT_AMBULATORY_CARE_PROVIDER_SITE_OTHER): Payer: Medicare Other | Admitting: Ophthalmology

## 2013-07-02 ENCOUNTER — Ambulatory Visit: Payer: Medicare Other | Admitting: Family Medicine

## 2013-07-22 ENCOUNTER — Telehealth: Payer: Self-pay | Admitting: Family Medicine

## 2013-07-22 NOTE — Telephone Encounter (Signed)
Pt is having labs done on 07/23/13 and is requesting that her VITAMIN D be checked also

## 2013-07-22 NOTE — Telephone Encounter (Signed)
Is it okay to add Vitamin D

## 2013-07-22 NOTE — Telephone Encounter (Signed)
Yes-25 [place] Vit D

## 2013-07-23 ENCOUNTER — Other Ambulatory Visit (INDEPENDENT_AMBULATORY_CARE_PROVIDER_SITE_OTHER): Payer: Medicare Other

## 2013-07-23 DIAGNOSIS — I10 Essential (primary) hypertension: Secondary | ICD-10-CM

## 2013-07-23 DIAGNOSIS — Z0000 Encounter for general adult medical examination without abnormal findings: Secondary | ICD-10-CM

## 2013-07-23 DIAGNOSIS — E785 Hyperlipidemia, unspecified: Secondary | ICD-10-CM

## 2013-07-23 LAB — CBC WITH DIFFERENTIAL/PLATELET
BASOS ABS: 0 10*3/uL (ref 0.0–0.1)
Basophils Relative: 0.6 % (ref 0.0–3.0)
EOS PCT: 3.8 % (ref 0.0–5.0)
Eosinophils Absolute: 0.2 10*3/uL (ref 0.0–0.7)
HEMATOCRIT: 40.2 % (ref 36.0–46.0)
HEMOGLOBIN: 13.6 g/dL (ref 12.0–15.0)
LYMPHS ABS: 2.1 10*3/uL (ref 0.7–4.0)
Lymphocytes Relative: 34.5 % (ref 12.0–46.0)
MCHC: 33.9 g/dL (ref 30.0–36.0)
MCV: 92.2 fl (ref 78.0–100.0)
Monocytes Absolute: 0.7 10*3/uL (ref 0.1–1.0)
Monocytes Relative: 11 % (ref 3.0–12.0)
NEUTROS ABS: 3.1 10*3/uL (ref 1.4–7.7)
Neutrophils Relative %: 50.1 % (ref 43.0–77.0)
Platelets: 163 10*3/uL (ref 150.0–400.0)
RBC: 4.36 Mil/uL (ref 3.87–5.11)
RDW: 12.6 % (ref 11.5–15.5)
WBC: 6.1 10*3/uL (ref 4.0–10.5)

## 2013-07-23 LAB — POCT URINALYSIS DIPSTICK
Bilirubin, UA: NEGATIVE
Blood, UA: NEGATIVE
Glucose, UA: NEGATIVE
Ketones, UA: NEGATIVE
NITRITE UA: NEGATIVE
PH UA: 7
PROTEIN UA: NEGATIVE
Spec Grav, UA: 1.01
Urobilinogen, UA: 0.2

## 2013-07-23 LAB — HEPATIC FUNCTION PANEL
ALBUMIN: 4.5 g/dL (ref 3.5–5.2)
ALT: 30 U/L (ref 0–35)
AST: 29 U/L (ref 0–37)
Alkaline Phosphatase: 67 U/L (ref 39–117)
Bilirubin, Direct: 0 mg/dL (ref 0.0–0.3)
Total Bilirubin: 0.7 mg/dL (ref 0.2–1.2)
Total Protein: 7.1 g/dL (ref 6.0–8.3)

## 2013-07-23 LAB — BASIC METABOLIC PANEL
BUN: 9 mg/dL (ref 6–23)
CHLORIDE: 98 meq/L (ref 96–112)
CO2: 27 mEq/L (ref 19–32)
Calcium: 9.9 mg/dL (ref 8.4–10.5)
Creatinine, Ser: 0.6 mg/dL (ref 0.4–1.2)
GFR: 98.4 mL/min (ref >60.00)
GLUCOSE: 93 mg/dL (ref 70–99)
POTASSIUM: 4.8 meq/L (ref 3.5–5.1)
SODIUM: 134 meq/L — AB (ref 135–145)

## 2013-07-23 LAB — TSH: TSH: 0.52 u[IU]/mL (ref 0.35–4.50)

## 2013-07-23 LAB — LIPID PANEL
CHOL/HDL RATIO: 3
Cholesterol: 153 mg/dL (ref 0–200)
HDL: 49 mg/dL (ref >39.00)
LDL Cholesterol: 69 mg/dL (ref 0–99)
Triglycerides: 175 mg/dL — ABNORMAL HIGH (ref 0.0–149.0)
VLDL: 35 mg/dL (ref 0.0–40.0)

## 2013-07-23 NOTE — Addendum Note (Signed)
Addended by: Elmer Picker on: 07/23/2013 10:18 AM   Modules accepted: Orders

## 2013-07-24 LAB — VITAMIN D 25 HYDROXY (VIT D DEFICIENCY, FRACTURES): VIT D 25 HYDROXY: 53 ng/mL (ref 30–89)

## 2013-07-30 ENCOUNTER — Encounter: Payer: Self-pay | Admitting: Family Medicine

## 2013-07-30 ENCOUNTER — Ambulatory Visit (INDEPENDENT_AMBULATORY_CARE_PROVIDER_SITE_OTHER): Payer: Medicare Other | Admitting: Family Medicine

## 2013-07-30 ENCOUNTER — Telehealth: Payer: Self-pay | Admitting: Family Medicine

## 2013-07-30 VITALS — BP 130/74 | HR 68 | Temp 98.0°F | Ht 61.0 in | Wt 156.0 lb

## 2013-07-30 DIAGNOSIS — I10 Essential (primary) hypertension: Secondary | ICD-10-CM

## 2013-07-30 DIAGNOSIS — G4700 Insomnia, unspecified: Secondary | ICD-10-CM

## 2013-07-30 DIAGNOSIS — Z789 Other specified health status: Secondary | ICD-10-CM

## 2013-07-30 DIAGNOSIS — E871 Hypo-osmolality and hyponatremia: Secondary | ICD-10-CM | POA: Insufficient documentation

## 2013-07-30 DIAGNOSIS — Z23 Encounter for immunization: Secondary | ICD-10-CM

## 2013-07-30 DIAGNOSIS — F5104 Psychophysiologic insomnia: Secondary | ICD-10-CM

## 2013-07-30 DIAGNOSIS — Z0000 Encounter for general adult medical examination without abnormal findings: Secondary | ICD-10-CM

## 2013-07-30 NOTE — Progress Notes (Signed)
Pre visit review using our clinic review tool, if applicable. No additional management support is needed unless otherwise documented below in the visit note. 

## 2013-07-30 NOTE — Patient Instructions (Signed)
Continue with yearly flu vaccine and yearly mammogram Continue with daily calcium and Vit D supplements.

## 2013-07-30 NOTE — Progress Notes (Signed)
Subjective:    Patient ID: Cassandra Foster, female    DOB: [birth year], 73 y.o.   MRN: [id number]  HPI Patient here for Medicare wellness exam and medical followup Chronic problems include history of osteoporosis, hypertension, chronic mild hyponatremia, chronic insomnia, dyslipidemia, GERD. She still sees gynecologist regularly. She has had previous DEXA scan in the past year. She is getting regular mammograms though we have no record. She needs Prevnar 13 vaccine. She states she never had chickenpox to her knowledge and thus has never had shingles vaccine.  She is scheduled for repeat colonoscopy this December.  She's had some recent problems with left fourth toe pain. She states she had extensive laceration age 18 and had some scar tissue around that toe and has had some recent issues with surrounding toes overriding that one  Past Medical History  Diagnosis Date  . Colon polyps   . Hyperlipidemia   . Hypertension   . Osteoporosis   . Chronic insomnia    Past Surgical History  Procedure Laterality Date  . Breast surgery      1985, 1997  . Knee surgery  2009    kneecap  . Other surgical history      childbirth x 3    reports that she has never smoked. She does not have any smokeless tobacco history on file. She reports that she does not drink alcohol or use illicit drugs. family history includes Cancer in her mother; Stroke in her father. Allergies  Allergen Reactions  . Simvastatin Other (See Comments)    Body aches   1.  Risk factors based on Past Medical , Social, and Family history reviewed and as above 2.  Limitations in physical activities no formal exercise but low risk for fall 3.  Depression/mood no depression or anxiety issues 4.  Hearing no major deficits 5.  ADLs independent in all 6.  Cognitive function (orientation to time and place, language, writing, speech,memory) no major memory deficits. Language and judgment intact 7.  Home Safety no issues. 8.   Height, weight, and visual acuity. All stable 9.  Counseling discussed the importance of regular weightbearing exercise. Continue yearly flu vaccine. Continue yearly mammogram 10. Recommendation of preventive services. Prevnar 13 given. 11. Labs based on risk factors check varicella. IgG-if positive, consider shingles vaccine 12. Care Plan as above    Review of Systems  Constitutional: Negative for fever, activity change, appetite change, fatigue and unexpected weight change.  HENT: Negative for ear pain, hearing loss, sore throat and trouble swallowing.   Eyes: Negative for visual disturbance.  Respiratory: Negative for cough and shortness of breath.   Cardiovascular: Negative for chest pain and palpitations.  Gastrointestinal: Negative for abdominal pain, diarrhea, constipation and blood in stool.  Genitourinary: Negative for dysuria and hematuria.  Musculoskeletal: Positive for arthralgias. Negative for back pain and myalgias.  Skin: Negative for rash.  Neurological: Negative for dizziness, syncope and headaches.  Hematological: Negative for adenopathy.  Psychiatric/Behavioral: Negative for confusion and dysphoric mood.       Objective:   Physical Exam  Constitutional: She is oriented to person, place, and time. She appears well-developed and well-nourished.  HENT:  Head: Normocephalic and atraumatic.  Eyes: EOM are normal. Pupils are equal, round, and reactive to light.  Neck: Normal range of motion. Neck supple. No thyromegaly present.  Cardiovascular: Normal rate, regular rhythm and normal heart sounds.   No murmur heard. Pulmonary/Chest: Breath sounds normal. No respiratory distress. She has no wheezes. She  has no rales.  Abdominal: Soft. Bowel sounds are normal. She exhibits no distension and no mass. There is no tenderness. There is no rebound and no guarding.  Genitourinary:  Per GYN  Musculoskeletal: Normal range of motion. She exhibits no edema.  Lymphadenopathy:     She has no cervical adenopathy.  Neurological: She is alert and oriented to person, place, and time. She displays normal reflexes. No cranial nerve deficit.  Skin: No rash noted.  Psychiatric: She has a normal mood and affect. Her behavior is normal. Judgment and thought content normal.          Assessment & Plan:  #1 health maintenance. Prevnar 13 given. She will get colonoscopy later this year. Check varicella IgG antibodies-and if positive, consider shingles vaccine.  #2 hypertension which is stable #3 history of mild hyponatremia. Probably related to hydrochlorothiazide. If progresses down the road may need to stop HCTZ #4 chronic insomnia-sleep hygiene discussed.

## 2013-07-30 NOTE — Telephone Encounter (Signed)
Pt called back and would like to have that referral to the sports medicine doctor about her shoulder per Dr Elease Hashimoto request pt said the appt need to be in July

## 2013-07-30 NOTE — Telephone Encounter (Signed)
Pt informed that card with information sent to home address.

## 2013-07-31 LAB — VARICELLA ZOSTER ANTIBODY, IGG: Varicella IgG: >4000 Index — ABNORMAL HIGH (ref <135.00)

## 2013-08-06 ENCOUNTER — Telehealth: Payer: Self-pay | Admitting: Family Medicine

## 2013-08-06 NOTE — Telephone Encounter (Signed)
Pt informed with Dr. Caffie Pinto contact information

## 2013-08-06 NOTE — Telephone Encounter (Signed)
Pt is needing the information for a sports doctor in the clinic.

## 2013-08-10 ENCOUNTER — Telehealth: Payer: Self-pay | Admitting: Family Medicine

## 2013-08-10 ENCOUNTER — Other Ambulatory Visit: Payer: Self-pay | Admitting: Family Medicine

## 2013-08-10 DIAGNOSIS — M25519 Pain in unspecified shoulder: Secondary | ICD-10-CM

## 2013-08-10 NOTE — Telephone Encounter (Signed)
Pt needs a to see md for her shoulders. Please put in referral

## 2013-08-10 NOTE — Telephone Encounter (Signed)
Referral is ordered

## 2013-08-10 NOTE — Telephone Encounter (Signed)
Set up with Gardenia Phlegm.

## 2013-08-18 ENCOUNTER — Encounter: Payer: Self-pay | Admitting: Family Medicine

## 2013-08-18 ENCOUNTER — Ambulatory Visit (INDEPENDENT_AMBULATORY_CARE_PROVIDER_SITE_OTHER): Payer: Medicare Other | Admitting: Family Medicine

## 2013-08-18 ENCOUNTER — Other Ambulatory Visit (INDEPENDENT_AMBULATORY_CARE_PROVIDER_SITE_OTHER): Payer: Medicare Other

## 2013-08-18 ENCOUNTER — Telehealth: Payer: Self-pay | Admitting: Family Medicine

## 2013-08-18 ENCOUNTER — Ambulatory Visit (INDEPENDENT_AMBULATORY_CARE_PROVIDER_SITE_OTHER)
Admission: RE | Admit: 2013-08-18 | Discharge: 2013-08-18 | Disposition: A | Discharge status: Home / Self Care | Payer: Medicare Other | Source: Ambulatory Visit | Attending: Family Medicine | Admitting: Family Medicine

## 2013-08-18 VITALS — BP 128/72 | HR 70 | Ht 61.0 in | Wt 156.0 lb

## 2013-08-18 DIAGNOSIS — M25511 Pain in right shoulder: Secondary | ICD-10-CM

## 2013-08-18 DIAGNOSIS — M12811 Other specific arthropathies, not elsewhere classified, right shoulder: Secondary | ICD-10-CM

## 2013-08-18 DIAGNOSIS — M12819 Other specific arthropathies, not elsewhere classified, unspecified shoulder: Secondary | ICD-10-CM | POA: Insufficient documentation

## 2013-08-18 DIAGNOSIS — M25519 Pain in unspecified shoulder: Secondary | ICD-10-CM

## 2013-08-18 DIAGNOSIS — M19019 Primary osteoarthritis, unspecified shoulder: Secondary | ICD-10-CM

## 2013-08-18 NOTE — Progress Notes (Signed)
[facility] [address] [place], [place] [address] Phone: [phone] Subjective:    I'm seeing this patient by the request  of:   Eulas Post, MD   CC: Right shoulder pain  [id number] Cassandra Foster is a 73 y.o. female coming in with complaint of right shoulder pain. Patient states that she's had this pain for about 2 months. Patient remembers actually trying to pick out concrete powder and tried to lift it and unfortunately did not. Patient actually had retinal tearing, hernia and then had the shoulder pain that has continued. Patient states since this time she continues to have discomfort into her shoulder. Patient states it seems to be improving. Patient at first did not have any significant range of motion     Past medical history, social, surgical and family history all reviewed in electronic medical record.   Review of Systems: No headache, visual changes, nausea, vomiting, diarrhea, constipation, dizziness, abdominal pain, skin rash, fevers, chills, night sweats, weight loss, swollen lymph nodes, body aches, joint swelling, muscle aches, chest pain, shortness of breath, mood changes.   Objective There were no vitals taken for this visit.  General: No apparent distress alert and oriented x3 mood and affect normal, dressed appropriately.  HEENT: Pupils equal, extraocular movements intact  Respiratory: Patient's speak in full sentences and does not appear short of breath  Cardiovascular: No lower extremity edema, non tender, no erythema  Skin: Warm dry intact with no signs of infection or rash on extremities or on axial skeleton.  Abdomen: Soft nontender  Neuro: Cranial nerves II through XII are intact, neurovascularly intact in all extremities with 2+ DTRs and 2+ pulses.  Lymph: No lymphadenopathy of posterior or anterior cervical chain or axillae bilaterally.  Gait normal with good balance and coordination.  MSK:  Non tender with full  range of motion and good stability and symmetric strength and tone of elbows, wrist, hip, knee and ankles bilaterally.  Shoulder: Right Inspection reveals no abnormalities, atrophy or asymmetry. Palpation is normal with no tenderness over AC joint or bicipital groove. ROM is restricted in the last 10 of full flexion. Patient does have abduction of 130. Full internal and external rotation. Rotator cuff strength normal throughout. signs of impingement with positive Neer and Hawkin's tests, but negative empty can sign. Speeds and Yergason's tests normal. No labral pathology noted with negative Obrien's, negative clunk and good stability. Normal scapular function observed. No painful arc and no drop arm sign. No apprehension sign  MSK US performed of: Right This study was ordered, performed, and interpreted by Charlann Boxer D.O.  Shoulder:   Supraspinatus:  Appears normal on long and transverse views, Bursal bulge seen with shoulder abduction on impingement view. Moderate osteoarthritis as well. Infraspinatus:  Appears normal on long and transverse views. Significant increase in Doppler flow Subscapularis:  Appears normal on long and transverse views. Positive bursa Teres Minor:  Appears normal on long and transverse views. AC joint:  Capsule undistended, no geyser sign. Glenohumeral Joint:  Appears normal without effusion. Glenoid Labrum:  Intact without visualized tears. Biceps Tendon:  Appears normal on long and transverse views, no fraying of tendon, tendon located in intertubercular groove, no subluxation with shoulder internal or external rotation.  Impression: Subacromial bursitis, moderate osteoarthritic  Procedure: Real-time Ultrasound Guided Injection of right glenohumeral joint Device: GE Logiq E  Ultrasound guided injection is preferred based studies that show increased duration, increased effect, greater accuracy, decreased procedural pain, increased response  rate with  ultrasound guided versus blind injection.  Verbal informed consent obtained.  Time-out conducted.  Noted no overlying erythema, induration, or other signs of local infection.  Skin prepped in a sterile fashion.  Local anesthesia: Topical Ethyl chloride.  With sterile technique and under real time ultrasound guidance:  Joint visualized.  23g 1  inch needle inserted posterior approach. Pictures taken for needle placement. Patient did have injection of 2 cc of 1% lidocaine, 2 cc of 0.5% Marcaine, and 1.0 cc of Kenalog 40 mg/dL. Completed without difficulty  Pain immediately resolved suggesting accurate placement of the medication.  Advised to call if fevers/chills, erythema, induration, drainage, or persistent bleeding.  Images permanently stored and available for review in the ultrasound unit.  Impression: Technically successful ultrasound guided injection.    Impression and Recommendations:     This case required medical decision making of moderate complexity.

## 2013-08-18 NOTE — Patient Instructions (Addendum)
Very nice to meet you Ice 20 minutes 2 times a day Exercises 3 times a week.  Turmeric 500mg  twice daily can help.   xrays downstairs as well today Come back in 3 weeks to make sure you are better.

## 2013-08-18 NOTE — Telephone Encounter (Signed)
Pt is wanting results from her x-ray on her shoulder that was done this mrng.

## 2013-08-18 NOTE — Assessment & Plan Note (Signed)
Patient was given an injection into the right shoulder. We discussed icing as well as a home exercise program. Patient does have some osteoarthritis and we will have her actually get an x-ray of her shoulder today as well. Patient is lacking the last 10 of flexion but it did have good strength of the rotator cuff itself. We discussed over-the-counter medications can be beneficial as well. Patient will try these interventions and come back and see me again in 3 weeks for further evaluation.

## 2013-08-19 NOTE — Telephone Encounter (Signed)
Left detailed message on VM.

## 2013-08-19 NOTE — Telephone Encounter (Signed)
This was done per Dr. Tamala Julian and results should be through his office

## 2013-09-04 ENCOUNTER — Ambulatory Visit (INDEPENDENT_AMBULATORY_CARE_PROVIDER_SITE_OTHER): Payer: Medicare Other | Admitting: Ophthalmology

## 2013-09-19 ENCOUNTER — Other Ambulatory Visit: Payer: Self-pay | Admitting: Family Medicine

## 2013-09-21 ENCOUNTER — Ambulatory Visit: Payer: Medicare Other | Admitting: Family Medicine

## 2013-09-21 ENCOUNTER — Ambulatory Visit (INDEPENDENT_AMBULATORY_CARE_PROVIDER_SITE_OTHER): Payer: Medicare Other | Admitting: Family Medicine

## 2013-09-21 ENCOUNTER — Encounter: Payer: Self-pay | Admitting: Family Medicine

## 2013-09-21 VITALS — BP 130/84 | HR 68 | Temp 97.9°F | Wt 157.0 lb

## 2013-09-21 DIAGNOSIS — R51 Headache: Secondary | ICD-10-CM

## 2013-09-21 DIAGNOSIS — W57XXXA Bitten or stung by nonvenomous insect and other nonvenomous arthropods, initial encounter: Secondary | ICD-10-CM

## 2013-09-21 DIAGNOSIS — T148 Other injury of unspecified body region: Secondary | ICD-10-CM

## 2013-09-21 NOTE — Patient Instructions (Signed)
Tick Bite Information Ticks are insects that attach themselves to the skin and draw blood for food. There are various types of ticks. Common types include wood ticks and deer ticks. Most ticks live in shrubs and grassy areas. Ticks can climb onto your body when you make contact with leaves or grass where the tick is waiting. The most common places on the body for ticks to attach themselves are the scalp, neck, armpits, waist, and groin. Most tick bites are harmless, but sometimes ticks carry germs that cause diseases. These germs can be spread to a person during the tick's feeding process. The chance of a disease spreading through a tick bite depends on:   The type of tick.  Time of year.   How long the tick is attached.   Geographic location.  HOW CAN YOU PREVENT TICK BITES? Take these steps to help prevent tick bites when you are outdoors:  Wear protective clothing. Long sleeves and long pants are best.   Wear white clothes so you can see ticks more easily.  Tuck your pant legs into your socks.   If walking on a trail, stay in the middle of the trail to avoid brushing against bushes.  Avoid walking through areas with long grass.  Put insect repellent on all exposed skin and along boot tops, pant legs, and sleeve cuffs.   Check clothing, hair, and skin repeatedly and before going inside.   Brush off any ticks that are not attached.  Take a shower or bath as soon as possible after being outdoors.  WHAT IS THE PROPER WAY TO REMOVE A TICK? Ticks should be removed as soon as possible to help prevent diseases caused by tick bites. 1. If latex gloves are available, put them on before trying to remove a tick.  2. Using fine-point tweezers, grasp the tick as close to the skin as possible. You may also use curved forceps or a tick removal tool. Grasp the tick as close to its head as possible. Avoid grasping the tick on its body. 3. Pull gently with steady upward pressure until  the tick lets go. Do not twist the tick or jerk it suddenly. This may break off the tick's head or mouth parts. 4. Do not squeeze or crush the tick's body. This could force disease-carrying fluids from the tick into your body.  5. After the tick is removed, wash the bite area and your hands with soap and water or other disinfectant such as alcohol. 6. Apply a small amount of antiseptic cream or ointment to the bite site.  7. Wash and disinfect any instruments that were used.  Do not try to remove a tick by applying a hot match, petroleum jelly, or fingernail polish to the tick. These methods do not work and may increase the chances of disease being spread from the tick bite.  WHEN SHOULD YOU SEEK MEDICAL CARE? Contact your health care provider if you are unable to remove a tick from your skin or if a part of the tick breaks off and is stuck in the skin.  After a tick bite, you need to be aware of signs and symptoms that could be related to diseases spread by ticks. Contact your health care provider if you develop any of the following in the days or weeks after the tick bite:  Unexplained fever.  Rash. A circular rash that appears days or weeks after the tick bite may indicate the possibility of Lyme disease. The rash may resemble   a target with a bull's-eye and may occur at a different part of your body than the tick bite.  Redness and swelling in the area of the tick bite.   Tender, swollen lymph glands.   Diarrhea.   Weight loss.   Cough.   Fatigue.   Muscle, joint, or bone pain.   Abdominal pain.   Headache.   Lethargy or a change in your level of consciousness.  Difficulty walking or moving your legs.   Numbness in the legs.   Paralysis.  Shortness of breath.   Confusion.   Repeated vomiting.  Document Released: 02/10/2000 Document Revised: 12/03/2012 Document Reviewed: 07/23/2012 Exit[employer] Patient Information 2015 [employer]. This information is  not intended to replace advice given to you by your health care provider. Make sure you discuss any questions you have with your health care provider.  

## 2013-09-21 NOTE — Progress Notes (Signed)
Pre visit review using our clinic review tool, if applicable. No additional management support is needed unless otherwise documented below in the visit note. 

## 2013-09-21 NOTE — Progress Notes (Signed)
   Subjective:    Patient ID: Cassandra Foster, female    DOB: [birth year], 73 y.o.   MRN: [id number]  HPI Patient had couple of recent tick bites. Most recently last Friday - left thigh. About 2 weeks ago also pulled off deer tick right thigh. She has been more fatigued over the weekend and had some occasional occipital headaches though these have been somewhat more chronic. She's never had any fever. No rash. No increased arthralgias. No nausea or vomiting  Past Medical History  Diagnosis Date  . Colon polyps   . Hyperlipidemia   . Hypertension   . Osteoporosis   . Chronic insomnia    Past Surgical History  Procedure Laterality Date  . Breast surgery      1985, 1997  . Knee surgery  2009    kneecap  . Other surgical history      childbirth x 3    reports that she has never smoked. She does not have any smokeless tobacco history on file. She reports that she does not drink alcohol or use illicit drugs. family history includes Cancer in her mother; Stroke in her father. Allergies  Allergen Reactions  . Simvastatin Other (See Comments)    Body aches      Review of Systems  Constitutional: Negative for fever and chills.  Respiratory: Negative for shortness of breath.   Skin: Negative for rash.  Neurological: Positive for headaches.       Objective:   Physical Exam  Constitutional: She appears well-developed and well-nourished. No distress.  Neck: Neck supple. No thyromegaly present.  Cardiovascular: Normal rate and regular rhythm.   Pulmonary/Chest: Effort normal and breath sounds normal. No respiratory distress. She has no wheezes. She has no rales.  Musculoskeletal: She exhibits no edema.  Skin: No rash noted.          Assessment & Plan:  Recent tick bites. She is describing deer tick. She does not describe any rash suggestive of erythema migrans. Low endemic area. She has mild occipital headache which is not likely related. She's had similar headaches in the  past. No fever, arthralgia, or other suggestive symptoms. Followup promptly for any fever, increased arthralgias, new skin rash.

## 2013-10-19 ENCOUNTER — Encounter (INDEPENDENT_AMBULATORY_CARE_PROVIDER_SITE_OTHER): Payer: Medicare Other | Admitting: Ophthalmology

## 2013-10-19 DIAGNOSIS — H2510 Age-related nuclear cataract, unspecified eye: Secondary | ICD-10-CM

## 2013-10-19 DIAGNOSIS — I10 Essential (primary) hypertension: Secondary | ICD-10-CM

## 2013-10-19 DIAGNOSIS — H33309 Unspecified retinal break, unspecified eye: Secondary | ICD-10-CM

## 2013-10-19 DIAGNOSIS — H3530 Unspecified macular degeneration: Secondary | ICD-10-CM

## 2013-10-19 DIAGNOSIS — H43819 Vitreous degeneration, unspecified eye: Secondary | ICD-10-CM

## 2013-10-19 DIAGNOSIS — H35039 Hypertensive retinopathy, unspecified eye: Secondary | ICD-10-CM

## 2013-11-03 ENCOUNTER — Ambulatory Visit (INDEPENDENT_AMBULATORY_CARE_PROVIDER_SITE_OTHER): Payer: Medicare Other | Admitting: Ophthalmology

## 2013-11-27 ENCOUNTER — Encounter: Payer: Self-pay | Admitting: Internal Medicine

## 2013-12-30 ENCOUNTER — Ambulatory Visit: Payer: Medicare Other | Admitting: Family Medicine

## 2013-12-30 ENCOUNTER — Ambulatory Visit (INDEPENDENT_AMBULATORY_CARE_PROVIDER_SITE_OTHER): Payer: Medicare Other

## 2013-12-30 DIAGNOSIS — Z23 Encounter for immunization: Secondary | ICD-10-CM

## 2014-01-01 ENCOUNTER — Ambulatory Visit: Payer: Medicare Other | Admitting: Family Medicine

## 2014-01-06 ENCOUNTER — Ambulatory Visit (INDEPENDENT_AMBULATORY_CARE_PROVIDER_SITE_OTHER): Payer: Medicare Other | Admitting: Family Medicine

## 2014-01-06 ENCOUNTER — Encounter: Payer: Self-pay | Admitting: Family Medicine

## 2014-01-06 VITALS — BP 110/68 | HR 80 | Wt 153.0 lb

## 2014-01-06 DIAGNOSIS — M25551 Pain in right hip: Secondary | ICD-10-CM

## 2014-01-06 NOTE — Progress Notes (Signed)
Pre visit review using our clinic review tool, if applicable. No additional management support is needed unless otherwise documented below in the visit note. 

## 2014-01-06 NOTE — Patient Instructions (Signed)
Hip Pain Your hip is the joint between your upper legs and your lower pelvis. The bones, cartilage, tendons, and muscles of your hip joint perform a lot of work each day supporting your body weight and allowing you to move around. Hip pain can range from a minor ache to severe pain in one or both of your hips. Pain may be felt on the inside of the hip joint near the groin, or the outside near the buttocks and upper thigh. You may have swelling or stiffness as well.  HOME CARE INSTRUCTIONS   Take medicines only as directed by your health care provider.  Apply ice to the injured area:  Put ice in a plastic bag.  Place a towel between your skin and the bag.  Leave the ice on for 15-20 minutes at a time, 3-4 times a day.  Keep your leg raised (elevated) when possible to lessen swelling.  Avoid activities that cause pain.  Follow specific exercises as directed by your health care provider.  Sleep with a pillow between your legs on your most comfortable side.  Record how often you have hip pain, the location of the pain, and what it feels like. SEEK MEDICAL CARE IF:   You are unable to put weight on your leg.  Your hip is red or swollen or very tender to touch.  Your pain or swelling continues or worsens after 1 week.  You have increasing difficulty walking.  You have a fever. SEEK IMMEDIATE MEDICAL CARE IF:   You have fallen.  You have a sudden increase in pain and swelling in your hip. MAKE SURE YOU:   Understand these instructions.  Will watch your condition.  Will get help right away if you are not doing well or get worse. Document Released: 08/02/2009 Document Revised: 06/29/2013 Document Reviewed: 10/09/2012 Exit[employer] Patient Information 2015 [employer]. This information is not intended to replace advice given to you by your health care provider. Make sure you discuss any questions you have with your health care provider.  

## 2014-01-06 NOTE — Progress Notes (Signed)
   Subjective:    Patient ID: Cassandra Foster, female    DOB: 1941-01-04, 73 y.o.   MRN: [id number]  HPI 73 year old female presents to the office with acute right hip pain x 3 weeks. Was standing on a chair in her closet and fell from height. The corner of the chair hit in the right groin. Right groin and labia majora turned black and was very edematous. Caused significant pain. Bruising has now resolved. The pain is worse with ambulation and when bending.  Denies back pain or numbness. She has been walking with walker and crutches and is now limping. Tried ibuprofen, ice and heat all with minimal relief. States in the past she has had bursitis and "torn muscle" in that same right hip. Denies arthritis to the hip. She has still been doing strenuous activities around the home.   Review of Systems  Constitutional: Negative for fever, activity change and fatigue.  Respiratory: Negative for cough and shortness of breath.   Cardiovascular: Negative for chest pain and leg swelling.  Musculoskeletal: Positive for myalgias and gait problem. Negative for back pain and joint swelling.  Skin: Positive for color change.  Neurological: Negative for dizziness, weakness, light-headedness, numbness and headaches.       Objective:   Physical Exam  Constitutional: She appears well-developed and well-nourished. No distress.  Cardiovascular: Normal rate, regular rhythm and normal heart sounds.   No murmur heard. Pulmonary/Chest: Effort normal and breath sounds normal. No respiratory distress. She has no wheezes.  Musculoskeletal: Normal range of motion. She exhibits no edema or tenderness (No point tenderness on palation).  Normal ROM and strength bilaterally.   Skin: She is not diaphoretic.  Nursing note and vitals reviewed.       Assessment & Plan:  1. Right lateral hip pain- increased pain over past 3 weeks.  No localized pain.  Order complete hip xray.  She does not want to take any medications  that will limit activities at home.  Continue to use ice/ heat.   Joseph Art, PA-S     As above. Patient has had 3 weeks of some very poorly localized right lateral hip pain.  She has good range of motion preserved right hip. Minimal lateral tenderness. Initial bruising medial aspect which has resolved. No lumbar spinal tenderness. Straight leg raise is negative. Nonfocal exam neurologically. Doubt pelvic fracture. This may represent just contusion but with duration of symptoms obtained right hip x-ray to further evaluate. She is managing pain with over-the-counter medications  Bruce Burchette M.D.

## 2014-01-14 ENCOUNTER — Telehealth: Payer: Self-pay

## 2014-01-14 MED ORDER — AZITHROMYCIN 250 MG PO TABS: ORAL_TABLET | ORAL | Status: DC

## 2014-01-14 NOTE — Telephone Encounter (Signed)
RX sent to pharmacy  

## 2014-01-14 NOTE — Telephone Encounter (Signed)
Pt called wanting a Zpak. Pt states that is having chest congestion, cough, and watery eyes. Pt was seen 01/06/14

## 2014-01-14 NOTE — Telephone Encounter (Signed)
OK to send in 

## 2014-01-20 ENCOUNTER — Ambulatory Visit: Payer: Medicare Other | Admitting: Family Medicine

## 2014-01-27 ENCOUNTER — Encounter: Payer: Medicare Other | Admitting: Internal Medicine

## 2014-02-08 ENCOUNTER — Encounter: Payer: Self-pay | Admitting: Internal Medicine

## 2014-02-24 ENCOUNTER — Other Ambulatory Visit: Payer: Self-pay | Admitting: Family Medicine

## 2014-02-24 ENCOUNTER — Ambulatory Visit (AMBULATORY_SURGERY_CENTER): Payer: Self-pay | Admitting: *Deleted

## 2014-02-24 VITALS — Ht 62.0 in | Wt 152.0 lb

## 2014-02-24 DIAGNOSIS — Z86010 Personal history of colonic polyps: Secondary | ICD-10-CM

## 2014-02-24 NOTE — Progress Notes (Signed)
Patient denies any allergies to eggs or soy. Patient denies any problems with sedation, pt does state her BP dropped with one surgery(tubal ligation). Patient denies any oxygen use at home and does not take any diet/weight loss medications. EMMI education assisgned to patient on colonoscopy, this was explained and instructions given to patient.

## 2014-03-09 ENCOUNTER — Ambulatory Visit (AMBULATORY_SURGERY_CENTER): Payer: Medicare Other | Admitting: Internal Medicine

## 2014-03-09 ENCOUNTER — Encounter: Payer: Self-pay | Admitting: Internal Medicine

## 2014-03-09 VITALS — BP 134/61 | HR 56 | Temp 97.9°F | Resp 19 | Ht 62.0 in | Wt 152.0 lb

## 2014-03-09 DIAGNOSIS — Z800 Family history of malignant neoplasm of digestive organs: Secondary | ICD-10-CM

## 2014-03-09 DIAGNOSIS — Z860100 Personal history of colon polyps, unspecified: Secondary | ICD-10-CM | POA: Insufficient documentation

## 2014-03-09 MED ORDER — FLEET ENEMA 7-19 GM/118ML RE ENEM
1.0000 | ENEMA | Freq: Once | RECTAL | Status: AC
  Administered 2014-03-09: 1 via RECTAL

## 2014-03-09 MED ORDER — SODIUM CHLORIDE 0.9 % IV SOLN: 500.0000 mL | INTRAVENOUS | Status: DC

## 2014-03-09 NOTE — Op Note (Signed)
[facility]  [address] [place], [id number]   COLONOSCOPY PROCEDURE REPORT  PATIENT: Cassandra Foster, Cassandra Foster  MR#: [id number] BIRTHDATE: [birth year] , 73  yrs. old GENDER: female ENDOSCOPIST: Gatha Mayer, MD, [facility] PROCEDURE DATE:  03/09/2014 PROCEDURE:   Colonoscopy, surveillance First Screening Colonoscopy - Avg.  risk and is 50 yrs.  old or older - No.  Prior Negative Screening - Now for repeat screening. N/A  History of Adenoma - Now for follow-up colonoscopy & has been > or = to 3 yrs.  Yes hx of adenoma.  Has been 3 or more years since last colonoscopy.  Polyps Removed Today? No.  Polyps Removed Today? No.  Recommend repeat exam, <10 yrs? Polyps Removed Today? No.  Recommend repeat exam, <10 yrs? Yes.  Polyps Removed Today? No.  Recommend repeat exam, <10 yrs? Yes.  High risk (family or personal hx). ASA CLASS:   Class III INDICATIONS:high risk personal history of colonic polyps, patient's immediate family history of colon cancer, and patient's family history of colon polyps. MEDICATIONS: Propofol 300 mg IV and Monitored anesthesia care  DESCRIPTION OF PROCEDURE:   After the risks benefits and alternatives of the procedure were thoroughly explained, informed consent was obtained.  The digital rectal exam revealed no abnormalities of the rectum.   The LB [id number]-H190 [id number]  endoscope was introduced through the anus and advanced to the cecum, which was identified by both the appendix and ileocecal valve. No adverse events experienced.   The quality of the prep was good, using MiraLax  The instrument was then slowly withdrawn as the colon was fully examined.      COLON FINDINGS: A normal appearing cecum, ileocecal valve, and appendiceal orifice were identified.  The ascending, transverse, descending, sigmoid colon, and rectum appeared unremarkable. Retroflexed views revealed no abnormalities. The time to cecum=7 minutes 24 seconds.  Withdrawal time=9 minutes  18 seconds.  The scope was withdrawn and the procedure completed. COMPLICATIONS: There were no immediate complications.  ENDOSCOPIC IMPRESSION: Normal colonoscopy  RECOMMENDATIONS: Repeat Colonoscopy in 5 years if vigorous at 63  eSigned:  Gatha Mayer, MD, [facility] 03/09/2014 12:10 PM   cc: The Patient

## 2014-03-09 NOTE — Progress Notes (Addendum)
Patient stating she followed instructions for bowel prep, yet results cloudy brown. Informed Dr. Carlean Purl, enema given. Results brown opaque liquid, no solids.Discussed with Dr. Carlean Purl, additional Fleets enema given. Results yellow following 2nd enema.

## 2014-03-09 NOTE — Progress Notes (Signed)
Report to PACU, RN, vss, BBS= Clear.  

## 2014-03-09 NOTE — Patient Instructions (Addendum)
No polyps today. Given your personal and family history i recommend one more colonoscopy most likely - at age 74 in 72 years.  I appreciate the opportunity to care for you. Gatha Mayer, MD, FACG  YOU HAD AN ENDOSCOPIC PROCEDURE TODAY AT [facility]: Refer to the procedure report that was given to you for any specific questions about what was found during the examination.  If the procedure report does not answer your questions, please call your gastroenterologist to clarify.  If you requested that your care partner not be given the details of your procedure findings, then the procedure report has been included in a sealed envelope for you to review at your convenience later.  YOU SHOULD EXPECT: Some feelings of bloating in the abdomen. Passage of more gas than usual.  Walking can help get rid of the air that was put into your GI tract during the procedure and reduce the bloating. If you had a lower endoscopy (such as a colonoscopy or flexible sigmoidoscopy) you may notice spotting of blood in your stool or on the toilet paper. If you underwent a bowel prep for your procedure, then you may not have a normal bowel movement for a few days.  DIET: Your first meal following the procedure should be a light meal and then it is ok to progress to your normal diet.  A half-sandwich or bowl of soup is an example of a good first meal.  Heavy or fried foods are harder to digest and may make you feel nauseous or bloated.  Likewise meals heavy in dairy and vegetables can cause extra gas to form and this can also increase the bloating.  Drink plenty of fluids but you should avoid alcoholic beverages for 24 hours.  ACTIVITY: Your care partner should take you home directly after the procedure.  You should plan to take it easy, moving slowly for the rest of the day.  You can resume normal activity the day after the procedure however you should NOT DRIVE or use heavy machinery for 24 hours  (because of the sedation medicines used during the test).    SYMPTOMS TO REPORT IMMEDIATELY: A gastroenterologist can be reached at any hour.  During normal business hours, 8:30 AM to 5:00 PM Monday through Friday, call [phone].  After hours and on weekends, please call the GI answering service at [phone] who will take a message and have the physician on call contact you.   Following lower endoscopy (colonoscopy or flexible sigmoidoscopy):  Excessive amounts of blood in the stool  Significant tenderness or worsening of abdominal pains  Swelling of the abdomen that is new, acute  Fever of 100F or higher  FOLLOW UP: If any biopsies were taken you will be contacted by phone or by letter within the next 1-3 weeks.  Call your gastroenterologist if you have not heard about the biopsies in 3 weeks.  Our staff will call the home number listed on your records the next business day following your procedure to check on you and address any questions or concerns that you may have at that time regarding the information given to you following your procedure. This is a courtesy call and so if there is no answer at the home number and we have not heard from you through the emergency physician on call, we will assume that you have returned to your regular daily activities without incident.  SIGNATURES/CONFIDENTIALITY: You and/or your care partner have signed paperwork which  will be entered into your electronic medical record.  These signatures attest to the fact that that the information above on your After Visit Summary has been reviewed and is understood.  Full responsibility of the confidentiality of this discharge information lies with you and/or your care-partner.  Read the handouts given to you by your recovery room nurse.

## 2014-03-10 ENCOUNTER — Telehealth: Payer: Self-pay

## 2014-03-10 NOTE — Telephone Encounter (Signed)
  Follow up Call-  Call back number 03/09/2014  Post procedure Call Back phone  # [phone]  Permission to leave phone message No     Patient questions:  Do you have a fever, pain , or abdominal swelling? No. Pain Score  0 *  Have you tolerated food without any problems? Yes.    Have you been able to return to your normal activities? Yes.    Do you have any questions about your discharge instructions: Diet   No. Medications  No. Follow up visit  No.  Do you have questions or concerns about your Care? No.  Actions: * If pain score is 4 or above: No action needed, pain <4.   No problems per the pt. maw

## 2014-03-18 ENCOUNTER — Ambulatory Visit: Payer: Medicare Other | Admitting: Family Medicine

## 2014-04-23 ENCOUNTER — Other Ambulatory Visit: Payer: Self-pay | Admitting: Family Medicine

## 2014-04-26 ENCOUNTER — Ambulatory Visit (INDEPENDENT_AMBULATORY_CARE_PROVIDER_SITE_OTHER): Payer: Medicare Other | Admitting: Ophthalmology

## 2014-05-18 ENCOUNTER — Other Ambulatory Visit: Payer: Self-pay | Admitting: Family Medicine

## 2014-05-22 ENCOUNTER — Other Ambulatory Visit: Payer: Self-pay | Admitting: Family Medicine

## 2014-07-13 ENCOUNTER — Telehealth: Payer: Self-pay

## 2014-07-13 NOTE — Telephone Encounter (Signed)
Left message on VM about mammogram.

## 2014-07-28 ENCOUNTER — Telehealth: Payer: Self-pay | Admitting: *Deleted

## 2014-07-28 NOTE — Telephone Encounter (Signed)
FYI

## 2014-07-28 NOTE — Telephone Encounter (Signed)
PLEASE NOTE: All timestamps contained within this report are represented as [place] Standard Time. CONFIDENTIALTY NOTICE: This fax transmission is intended only for the addressee. It contains information that is legally privileged, confidential or otherwise protected from use or disclosure. If you are not the intended recipient, you are strictly prohibited from reviewing, disclosing, copying using or disseminating any of this information or taking any action in reliance on or regarding this information. If you have received this fax in error, please notify us immediately by telephone so that we can arrange for its return to [place]. Phone: [phone], Toll-Free: [phone], Fax: [phone] Page: 1 of 1 Call Id: [id number] [facility] [place] Patient Name: Cassandra Foster Gender: Female DOB: 08/29/1940 Age: 74 Y 2 M 7 D Return Phone Number: [phone] (Primary) Address: City/State/Zip: [place] Client [facility] - Client Client Site [place] - Day Physician Carolann Littler Contact Type Call Call Type Triage / Clinical Relationship To Patient Self Appointment Disposition EMR Appointment Not Necessary Info pasted into Epic No Return Phone Number [phone] (Primary) Chief Complaint Burn Initial Comment Caller states she rubbed a brown spot on her skin with clorox, to lighten it. It has been burning. Nurse Assessment Nurse: Denyse Amass, RN, Benjamine Mola Date/Time (Eastern Time): 07/27/2014 5:12:06 PM Confirm and document reason for call. If symptomatic, describe symptoms. ---Patient states she had some brown spots on her mid upper chest and she took a Q tip and rubbed Clorax on it about 4 days ago. States the skin has been burning. She called the [facility], they told her to wash it off and put Aloe on it because it was a chemical burn. States she did that  and it is feeling better. It stopped burning and it feels better. Has the patient traveled out of the country within the last 30 days? ---Not Applicable Does the patient require triage? ---No Guidelines Guideline Title Affirmed Question Affirmed Notes Nurse Date/Time (Eastern Time) Disp. Time Eilene Ghazi Time) Disposition Final User 07/27/2014 5:02:19 PM Send To Clinical Follow Up Levin Bacon, RN, Cayla 07/27/2014 5:19:29 PM Clinical Call Yes Denyse Amass, RN, Benjamine Mola After Care Instructions Given Call Event Type User Date / Time Description Comments User: Lin Givens, RN Date/Time Eilene Ghazi Time): 07/27/2014 5:19:11 PM Patient has already gotten medical advise from Poison Control, has cleaned the area and put Aloe on the chemical burn and states it feels much better within 30 minutes and it is not burning anymore.

## 2014-08-19 ENCOUNTER — Encounter: Payer: Self-pay | Admitting: *Deleted

## 2014-09-09 ENCOUNTER — Encounter: Payer: Self-pay | Admitting: Internal Medicine

## 2014-09-14 ENCOUNTER — Telehealth: Payer: Self-pay | Admitting: Family Medicine

## 2014-09-14 DIAGNOSIS — M810 Age-related osteoporosis without current pathological fracture: Secondary | ICD-10-CM

## 2014-09-14 NOTE — Telephone Encounter (Signed)
Lab order entered.

## 2014-09-14 NOTE — Telephone Encounter (Signed)
OK to schedule Vit D.  i have no idea what she means by "protein in bones"?

## 2014-09-14 NOTE — Telephone Encounter (Signed)
Pt is sch for cpx labs on 09-16-14 and would like to have vit d and to check for protein in bones. Pt has osteoporosis. Can I sch?

## 2014-09-16 ENCOUNTER — Other Ambulatory Visit (INDEPENDENT_AMBULATORY_CARE_PROVIDER_SITE_OTHER): Payer: Medicare Other

## 2014-09-16 DIAGNOSIS — E785 Hyperlipidemia, unspecified: Secondary | ICD-10-CM

## 2014-09-16 DIAGNOSIS — I10 Essential (primary) hypertension: Secondary | ICD-10-CM

## 2014-09-16 DIAGNOSIS — Z0000 Encounter for general adult medical examination without abnormal findings: Secondary | ICD-10-CM | POA: Diagnosis not present

## 2014-09-16 DIAGNOSIS — M810 Age-related osteoporosis without current pathological fracture: Secondary | ICD-10-CM | POA: Diagnosis not present

## 2014-09-16 LAB — HEPATIC FUNCTION PANEL
ALT: 35 U/L (ref 0–35)
AST: 26 U/L (ref 0–37)
Albumin: 4.3 g/dL (ref 3.5–5.2)
Alkaline Phosphatase: 75 U/L (ref 39–117)
BILIRUBIN TOTAL: 0.7 mg/dL (ref 0.2–1.2)
Bilirubin, Direct: 0.1 mg/dL (ref 0.0–0.3)
TOTAL PROTEIN: 6.6 g/dL (ref 6.0–8.3)

## 2014-09-16 LAB — BASIC METABOLIC PANEL
BUN: 12 mg/dL (ref 6–23)
CALCIUM: 9.3 mg/dL (ref 8.4–10.5)
CO2: 28 meq/L (ref 19–32)
CREATININE: 0.69 mg/dL (ref 0.40–1.20)
Chloride: 96 mEq/L (ref 96–112)
GFR: 88.32 mL/min (ref >60.00)
Glucose, Bld: 87 mg/dL (ref 70–99)
POTASSIUM: 4.2 meq/L (ref 3.5–5.1)
Sodium: 130 mEq/L — ABNORMAL LOW (ref 135–145)

## 2014-09-16 LAB — LIPID PANEL
CHOLESTEROL: 159 mg/dL (ref 0–200)
HDL: 50.3 mg/dL (ref >39.00)
LDL Cholesterol: 77 mg/dL (ref 0–99)
NONHDL: 108.7
TRIGLYCERIDES: 161 mg/dL — AB (ref 0.0–149.0)
Total CHOL/HDL Ratio: 3
VLDL: 32.2 mg/dL (ref 0.0–40.0)

## 2014-09-16 LAB — TSH: TSH: 1.82 u[IU]/mL (ref 0.35–4.50)

## 2014-09-16 LAB — CBC WITH DIFFERENTIAL/PLATELET
BASOS ABS: 0 10*3/uL (ref 0.0–0.1)
Basophils Relative: 0.6 % (ref 0.0–3.0)
Eosinophils Absolute: 0.2 10*3/uL (ref 0.0–0.7)
Eosinophils Relative: 2.3 % (ref 0.0–5.0)
HCT: 39.5 % (ref 36.0–46.0)
HEMOGLOBIN: 13.4 g/dL (ref 12.0–15.0)
LYMPHS PCT: 31 % (ref 12.0–46.0)
Lymphs Abs: 2.3 10*3/uL (ref 0.7–4.0)
MCHC: 33.9 g/dL (ref 30.0–36.0)
MCV: 92.4 fl (ref 78.0–100.0)
Monocytes Absolute: 0.8 10*3/uL (ref 0.1–1.0)
Monocytes Relative: 10.4 % (ref 3.0–12.0)
NEUTROS ABS: 4.2 10*3/uL (ref 1.4–7.7)
NEUTROS PCT: 55.7 % (ref 43.0–77.0)
PLATELETS: 159 10*3/uL (ref 150.0–400.0)
RBC: 4.28 Mil/uL (ref 3.87–5.11)
RDW: 13.1 % (ref 11.5–15.5)
WBC: 7.5 10*3/uL (ref 4.0–10.5)

## 2014-09-16 LAB — VITAMIN D 25 HYDROXY (VIT D DEFICIENCY, FRACTURES): VITD: 55.33 ng/mL (ref 30.00–100.00)

## 2014-09-17 NOTE — Telephone Encounter (Signed)
lmom for pt to call back

## 2014-09-20 NOTE — Telephone Encounter (Addendum)
Pt already had labs drawn.

## 2014-09-23 ENCOUNTER — Ambulatory Visit (INDEPENDENT_AMBULATORY_CARE_PROVIDER_SITE_OTHER): Payer: Medicare Other | Admitting: Family Medicine

## 2014-09-23 ENCOUNTER — Encounter: Payer: Self-pay | Admitting: Family Medicine

## 2014-09-23 VITALS — BP 120/80 | Temp 98.2°F | Ht 63.0 in | Wt 155.0 lb

## 2014-09-23 DIAGNOSIS — Z0000 Encounter for general adult medical examination without abnormal findings: Secondary | ICD-10-CM

## 2014-09-23 NOTE — Progress Notes (Signed)
Subjective:    Patient ID: Cassandra Foster, female    DOB: Aug 15, 1940, 74 y.o.   MRN: [id number]  HPI  Patient seen for complete physical. She continues to see gynecologist yearly and will get mammogram DEXA scan through their office later this summer. She had colonoscopy earlier this year. Tetanus up-to-date. She's had previous Prevnar 13 as well as Pneumovax and also previous shingles vaccine.  Past medical history reviewed with no significant changes  Past Medical History  Diagnosis Date  . Colon polyps   . Hyperlipidemia   . Hypertension   . Osteoporosis   . Chronic insomnia    Past Surgical History  Procedure Laterality Date  . Breast surgery      1985, 1997  . Knee surgery  2009    kneecap  . Other surgical history      childbirth x 3  . Tubal ligation      BP dropped   . Colonoscopy  multiple    reports that she has never smoked. She has never used smokeless tobacco. She reports that she does not drink alcohol or use illicit drugs. family history includes Cancer in her mother; Colon cancer (age of onset: 60) in her brother; Colon cancer (age of onset: 99) in her other; Prostate cancer in her brother; Stroke in her father. Allergies  Allergen Reactions  . Simvastatin Other (See Comments)    Body aches      Review of Systems  Constitutional: Negative for fever, activity change, appetite change, fatigue and unexpected weight change.  HENT: Negative for ear pain, hearing loss, sore throat and trouble swallowing.   Eyes: Negative for visual disturbance.  Respiratory: Negative for cough and shortness of breath.   Cardiovascular: Negative for chest pain and palpitations.  Gastrointestinal: Negative for abdominal pain, diarrhea, constipation and blood in stool.  Genitourinary: Negative for dysuria and hematuria.  Musculoskeletal: Negative for myalgias, back pain and arthralgias.  Skin: Negative for rash.  Neurological: Negative for dizziness, syncope and headaches.    Hematological: Negative for adenopathy.  Psychiatric/Behavioral: Negative for confusion and dysphoric mood.       Objective:   Physical Exam  Constitutional: She is oriented to person, place, and time. She appears well-developed and well-nourished.  HENT:  Head: Normocephalic and atraumatic.  Eyes: EOM are normal. Pupils are equal, round, and reactive to light.  Neck: Normal range of motion. Neck supple. No thyromegaly present.  Cardiovascular: Normal rate, regular rhythm and normal heart sounds.   No murmur heard. Pulmonary/Chest: Breath sounds normal. No respiratory distress. She has no wheezes. She has no rales.  Abdominal: Soft. Bowel sounds are normal. She exhibits no distension and no mass. There is no tenderness. There is no rebound and no guarding.  Genitourinary:  Per GYN  Musculoskeletal: Normal range of motion. She exhibits no edema.  Lymphadenopathy:    She has no cervical adenopathy.  Neurological: She is alert and oriented to person, place, and time. She displays normal reflexes. No cranial nerve deficit.  Skin: No rash noted.  Psychiatric: She has a normal mood and affect. Her behavior is normal. Judgment and thought content normal.          Assessment & Plan:  Complete physical. Labs reviewed. She has sodium which is slightly low at 130 probably related to thiazide. She is reluctant to change at this time. She will plan to see gynecologist for mammogram and DEXA scan. Continue regular calcium and vitamin D. Continue yearly flu vaccine. Other  immunizations are up-to-date.

## 2014-09-23 NOTE — Patient Instructions (Signed)
Continue with yearly flu vaccine

## 2014-09-23 NOTE — Progress Notes (Signed)
Pre visit review using our clinic review tool, if applicable. No additional management support is needed unless otherwise documented below in the visit note. 

## 2014-10-05 ENCOUNTER — Telehealth: Payer: Self-pay | Admitting: Family Medicine

## 2014-10-05 NOTE — Telephone Encounter (Signed)
Pt has a rash under her breast, looks like little whelps, and it really itches bad. Pt would like to know if she should she dermatolgist or see Dr Elease Hashimoto again. Pt states she also has a mole under her breast too that has turned dark and does not look good.  Pt aware dr Elease Hashimoto will return in the am and she is ok w/ that

## 2014-10-06 NOTE — Telephone Encounter (Signed)
We can evaluate.

## 2014-10-06 NOTE — Telephone Encounter (Signed)
Can you please schedule?

## 2014-10-08 ENCOUNTER — Ambulatory Visit (INDEPENDENT_AMBULATORY_CARE_PROVIDER_SITE_OTHER): Payer: Medicare Other | Admitting: Family Medicine

## 2014-10-08 ENCOUNTER — Encounter: Payer: Self-pay | Admitting: Family Medicine

## 2014-10-08 VITALS — BP 130/72 | HR 89 | Temp 98.1°F | Wt 155.0 lb

## 2014-10-08 DIAGNOSIS — B359 Dermatophytosis, unspecified: Secondary | ICD-10-CM

## 2014-10-08 MED ORDER — NYSTATIN 100000 UNIT/GM EX CREA: 1.0000 "application " | TOPICAL_CREAM | Freq: Two times a day (BID) | CUTANEOUS | Status: DC

## 2014-10-08 NOTE — Patient Instructions (Signed)
Yeast Infection of the Skin Some yeast on the skin is normal, but sometimes it causes an infection. If you have a yeast infection, it shows up as white or light brown patches on brown skin. You can see it better in the summer on tan skin. It causes light-colored holes in your suntan. It can happen on any area of the body. This cannot be passed from person to person. HOME CARE  Scrub your skin daily with a dandruff shampoo. Your rash may take a couple weeks to get well.  Do not scratch or itch the rash. GET HELP RIGHT AWAY IF:   You get another infection from scratching. The skin may get warm, red, and may ooze fluid.  The infection does not seem to be getting better. MAKE SURE YOU:  Understand these instructions.  Will watch your condition.  Will get help right away if you are not doing well or get worse. Document Released: 01/26/2008 Document Revised: 05/07/2011 Document Reviewed: 01/26/2008 [facility] Patient Information 2015 [place], [place]. This information is not intended to replace advice given to you by your health care provider. Make sure you discuss any questions you have with your health care provider.  Keep area as dry as possible. Try the Nystatin cream twice daily and be touch if no better in two weeks.

## 2014-10-08 NOTE — Progress Notes (Signed)
Pre visit review using our clinic review tool, if applicable. No additional management support is needed unless otherwise documented below in the visit note. 

## 2014-10-08 NOTE — Progress Notes (Signed)
   Subjective:    Patient ID: Cassandra Foster, female    DOB: 1940/03/30, 74 y.o.   MRN: [id number]  HPI Patient seen with rash under her breasts for past 2 weeks. She tried some type of topical anabiotic cream without relief. She has some itching. She is try to keep area dry as possible.  She has some diffuse alopecia. No localized hair loss. Recent thyroid functions normal. She has been under increased stress. She tries to avoid traction on her hair.   Review of Systems  Constitutional: Negative for fever and chills.  Skin: Positive for rash.  Psychiatric/Behavioral: The patient is nervous/anxious.        Objective:   Physical Exam  Constitutional: She appears well-developed and well-nourished.  HENT:  We cannot appreciate any localized alopecia.  Cardiovascular: Normal rate and regular rhythm.   Pulmonary/Chest: Effort normal and breath sounds normal. No respiratory distress. She has no wheezes. She has no rales.  Skin: Rash noted.  Patient has macular erythematous rash under both breasts. Some satellite lesions. No pustules          Assessment & Plan:  Skin rash. Suspect fungal. Nystatin cream twice daily. Keep area dry as possible. Follow-up as needed

## 2014-10-14 ENCOUNTER — Other Ambulatory Visit: Payer: Self-pay | Admitting: Family Medicine

## 2014-10-15 ENCOUNTER — Other Ambulatory Visit: Payer: Self-pay | Admitting: Family Medicine

## 2014-10-26 NOTE — Telephone Encounter (Signed)
Pt has been scheduled.  °

## 2014-11-09 ENCOUNTER — Other Ambulatory Visit: Payer: Self-pay | Admitting: Family Medicine

## 2014-11-12 ENCOUNTER — Telehealth: Payer: Self-pay | Admitting: Family Medicine

## 2014-11-12 MED ORDER — NYSTATIN 100000 UNIT/GM EX CREA: TOPICAL_CREAM | Freq: Two times a day (BID) | CUTANEOUS | Status: DC

## 2014-11-12 NOTE — Telephone Encounter (Signed)
Rx sent to pharmacy   

## 2014-11-12 NOTE — Telephone Encounter (Signed)
Pt has a yeast infection under her breast. Pt would like something call into drug store in [place]. Pt does not want to come in for an appt

## 2014-11-12 NOTE — Telephone Encounter (Signed)
Nystatin cream- apply to affected rash twice daily 15 gm with one refill

## 2014-11-14 ENCOUNTER — Other Ambulatory Visit: Payer: Self-pay | Admitting: Family Medicine

## 2014-11-15 ENCOUNTER — Ambulatory Visit (INDEPENDENT_AMBULATORY_CARE_PROVIDER_SITE_OTHER): Payer: Medicare Other | Admitting: Family Medicine

## 2014-11-15 ENCOUNTER — Other Ambulatory Visit: Payer: Self-pay | Admitting: *Deleted

## 2014-11-15 ENCOUNTER — Other Ambulatory Visit: Payer: Self-pay

## 2014-11-15 ENCOUNTER — Encounter: Payer: Self-pay | Admitting: Family Medicine

## 2014-11-15 VITALS — BP 120/60 | HR 76 | Temp 98.4°F | Ht 63.0 in | Wt 156.1 lb

## 2014-11-15 DIAGNOSIS — R21 Rash and other nonspecific skin eruption: Secondary | ICD-10-CM

## 2014-11-15 MED ORDER — TRAZODONE HCL 50 MG PO TABS: ORAL_TABLET | ORAL | Status: DC

## 2014-11-15 MED ORDER — TRIAMCINOLONE ACETONIDE 0.1 % EX CREA: 1.0000 "application " | TOPICAL_CREAM | Freq: Two times a day (BID) | CUTANEOUS | Status: DC

## 2014-11-15 NOTE — Progress Notes (Signed)
Pre visit review using our clinic review tool, if applicable. No additional management support is needed unless otherwise documented below in the visit note. 

## 2014-11-15 NOTE — Patient Instructions (Signed)
Heat may make your rash worse Try to wear loose clothing. Leave off the Nystatin cream and use the Triamcinolone cream.

## 2014-11-15 NOTE — Progress Notes (Signed)
   Subjective:    Patient ID: Cassandra Foster, female    DOB: 04/20/1940, 74 y.o.   MRN: [id number]  HPI Patient seen with pruritic rash mostly on her neck and trunk region. present for about 5 weeks. She initially felt this was "fungal "and started some nystatin cream which has not helped. She denies any change of soaps. She uses Dial soap. She notices that heat tends to exacerbate the rash. No alleviating factors. Irregular distribution.  Past Medical History  Diagnosis Date  . Colon polyps   . Hyperlipidemia   . Hypertension   . Osteoporosis   . Chronic insomnia    Past Surgical History  Procedure Laterality Date  . Breast surgery      1985, 1997  . Knee surgery  2009    kneecap  . Other surgical history      childbirth x 3  . Tubal ligation      BP dropped   . Colonoscopy  multiple    reports that she has never smoked. She has never used smokeless tobacco. She reports that she does not drink alcohol or use illicit drugs. family history includes Cancer in her mother; Colon cancer (age of onset: 60) in her brother; Colon cancer (age of onset: 28) in her other; Prostate cancer in her brother; Stroke in her father. Allergies  Allergen Reactions  . Simvastatin Other (See Comments)    Body aches      Review of Systems  Constitutional: Negative for fever and chills.  Skin: Positive for rash.       Objective:   Physical Exam  Constitutional: She appears well-developed and well-nourished. No distress.  Cardiovascular: Normal rate and regular rhythm.   Pulmonary/Chest: Effort normal and breath sounds normal. No respiratory distress. She has no wheezes. She has no rales.  Skin: Rash noted.  She has some nonspecific irregular distributed slightly raised erythematous rash on her neck and upper anterior trunk and abdomen. No pustules. Nonscaly.          Assessment & Plan:  Nonspecific rash neck and upper trunk. This does not look fungal and appears new more follicular.  Leave off nystatin. Try triamcinolone 0.1% cream twice daily. Continue loose fitted clothing and avoid getting overheated

## 2014-11-15 NOTE — Telephone Encounter (Signed)
Rx printed

## 2014-12-11 ENCOUNTER — Other Ambulatory Visit: Payer: Self-pay | Admitting: Family Medicine

## 2014-12-13 ENCOUNTER — Telehealth: Payer: Self-pay | Admitting: Family Medicine

## 2014-12-13 NOTE — Telephone Encounter (Signed)
Pt still has the rash and  triamcinolone aceton topical cream is not working and would like steroid pill call into the drugstore in [place]

## 2014-12-13 NOTE — Telephone Encounter (Signed)
Lets do brief prednisone taper :  Prednisone 10 mg taper:4-4-4-3-3-2-2  # 22 and be in touch if no better in one week.

## 2014-12-14 ENCOUNTER — Other Ambulatory Visit: Payer: Self-pay | Admitting: Family Medicine

## 2014-12-14 NOTE — Telephone Encounter (Signed)
Rx was call in  

## 2014-12-22 ENCOUNTER — Encounter: Payer: Self-pay | Admitting: Family Medicine

## 2014-12-22 ENCOUNTER — Ambulatory Visit (INDEPENDENT_AMBULATORY_CARE_PROVIDER_SITE_OTHER): Payer: Medicare Other | Admitting: Family Medicine

## 2014-12-22 VITALS — BP 138/60 | HR 79 | Temp 98.3°F | Wt 157.0 lb

## 2014-12-22 DIAGNOSIS — R21 Rash and other nonspecific skin eruption: Secondary | ICD-10-CM | POA: Diagnosis not present

## 2014-12-22 MED ORDER — CLOTRIMAZOLE-BETAMETHASONE 1-0.05 % EX CREA: 1.0000 "application " | TOPICAL_CREAM | Freq: Two times a day (BID) | CUTANEOUS | Status: DC

## 2014-12-22 NOTE — Patient Instructions (Signed)
-  Keep area as dry as possible

## 2014-12-22 NOTE — Progress Notes (Signed)
Pre visit review using our clinic review tool, if applicable. No additional management support is needed unless otherwise documented below in the visit note. 

## 2014-12-22 NOTE — Progress Notes (Signed)
   Subjective:    Patient ID: Cassandra Foster, female    DOB: 1940/11/14, 73 y.o.   MRN: [id number]  HPI Patient seen with persistent pruritic rash under her breasts and also some upper anterior chest wall. She described by phone note what sounded like recurrent hives on her upper chest and she has a different rash on her breasts. She tried antifungal with nystatin initially with minimal improvement. She recently used some triamcinolone without much improvement. She has really very minimal itching. No burning. Prednisone was called in for her urticarial rash which is not responding to antihistamines and this was mistakenly filled for dosage was much higher than that with that was instructed by my phone note.. We had called in a taper starting at 40 mg daily and was written for starting dose of 160 mg daily. Fortunately, patient never started this medication  Regarding her rash under the breasts she has mild itching. She tries to keep areas dry as possible. No history of diabetes.  Past Medical History  Diagnosis Date  . Colon polyps   . Hyperlipidemia   . Hypertension   . Osteoporosis   . Chronic insomnia    Past Surgical History  Procedure Laterality Date  . Breast surgery      1985, 1997  . Knee surgery  2009    kneecap  . Other surgical history      childbirth x 3  . Tubal ligation      BP dropped   . Colonoscopy  multiple    reports that she has never smoked. She has never used smokeless tobacco. She reports that she does not drink alcohol or use illicit drugs. family history includes Cancer in her mother; Colon cancer (age of onset: 55) in her brother; Colon cancer (age of onset: 60) in her other; Prostate cancer in her brother; Stroke in her father. Allergies  Allergen Reactions  . Simvastatin Other (See Comments)    Body aches      Review of Systems  Constitutional: Negative for fever and chills.  Skin: Positive for rash.       Objective:   Physical Exam    Constitutional: She appears well-developed and well-nourished.  Cardiovascular: Normal rate and regular rhythm.   Pulmonary/Chest: Effort normal and breath sounds normal. No respiratory distress. She has no wheezes. She has no rales.  Skin: Rash noted.  Patient has a couple of nonspecific erythematous papules on her upper anterior chest. No pustules. No vesicles. She has some very mild erythema under both breasts. Nonscaly.          Assessment & Plan:  Skin rash. Mostly underneath both breasts. She does have some mild inflammatory changes. Keep area dry as possible. Avoid constrictive clothing. Lotrisone 0.1% twice daily. Touch base if not improving over the next 1-2 weeks.

## 2014-12-31 ENCOUNTER — Other Ambulatory Visit: Payer: Self-pay | Admitting: Family Medicine

## 2015-01-17 ENCOUNTER — Telehealth: Payer: Self-pay | Admitting: Family Medicine

## 2015-01-17 NOTE — Telephone Encounter (Signed)
Pt call to ask if you would call her in a [facility] drug store

## 2015-01-17 NOTE — Telephone Encounter (Signed)
Pt needs an appointment for an antibiotic. Please schedule. Thanks.

## 2015-01-17 NOTE — Telephone Encounter (Signed)
lmovm letting pt know she need an appt and to call and schedule

## 2015-02-08 ENCOUNTER — Other Ambulatory Visit: Payer: Self-pay | Admitting: Family Medicine

## 2015-03-22 ENCOUNTER — Encounter: Payer: Self-pay | Admitting: Family Medicine

## 2015-03-22 ENCOUNTER — Ambulatory Visit (INDEPENDENT_AMBULATORY_CARE_PROVIDER_SITE_OTHER): Payer: Medicare Other | Admitting: Family Medicine

## 2015-03-22 ENCOUNTER — Telehealth: Payer: Self-pay | Admitting: Family Medicine

## 2015-03-22 VITALS — BP 120/76 | HR 90 | Temp 98.3°F | Ht 63.0 in | Wt 158.1 lb

## 2015-03-22 DIAGNOSIS — M79605 Pain in left leg: Secondary | ICD-10-CM

## 2015-03-22 DIAGNOSIS — M79604 Pain in right leg: Secondary | ICD-10-CM

## 2015-03-22 DIAGNOSIS — R21 Rash and other nonspecific skin eruption: Secondary | ICD-10-CM

## 2015-03-22 MED ORDER — PREDNISONE 10 MG PO TABS: ORAL_TABLET | ORAL | Status: DC

## 2015-03-22 NOTE — Patient Instructions (Signed)
BEFORE YOU LEAVE: -schedule follow up in 2 weeks with Dr. Elease Hashimoto rash, leg pain, HTN  For leg pain: -stretching as we discussed -heat -tylenol if needed per instructions  For the rash: -send new dose steroid to pharmacy per your request - take as instructed -follow up with your doctor in 2 weeks

## 2015-03-22 NOTE — Progress Notes (Signed)
HPI:  Cassandra Foster is a 75 yo here for an acute visit for:  1) bilateral leg pain -reports excessive activity over the holidays and had flare in knee issues and saw her orthopedic specialist twice -then shoveled a lot of snow and about 4-5 days ago developed bilateral moderate intermittent leg pain in both calves and around R knee - resolved with heat -no swelling,malaise, fevers, weakness or numbness  2) Rash: -reports has seen PCP for this several times -she wants prednisone - reports rx was sent to the pharmacy with worng dosing so she never took this and never got new rx -reports persistent itchy rash on chest and neck only -reports none of the creams have cleared this up -denies fevers, chills, malaise, rash elsewhere - reports was under breast but this is improved -denies use of any cleansers or creams or lotions on neck or chest  ROS: See pertinent positives and negatives per HPI.  Past Medical History  Diagnosis Date  . Colon polyps   . Hyperlipidemia   . Hypertension   . Osteoporosis   . Chronic insomnia     Past Surgical History  Procedure Laterality Date  . Breast surgery      1985, 1997  . Knee surgery  2009    kneecap  . Other surgical history      childbirth x 3  . Tubal ligation      BP dropped   . Colonoscopy  multiple    Family History  Problem Relation Age of Onset  . Cancer Mother     breast  . Stroke Father   . Colon cancer Brother 63  . Prostate cancer Brother   . Colon cancer Other 79    Social History   Social History  . Marital Status: Married    Spouse Name: N/A  . Number of Children: N/A  . Years of Education: N/A   Social History Main Topics  . Smoking status: Never Smoker   . Smokeless tobacco: Never Used  . Alcohol Use: No  . Drug Use: No  . Sexual Activity: Not Asked   Other Topics Concern  . None   Social History Narrative     Current outpatient prescriptions:  .  amLODipine (NORVASC) 5 MG tablet, TAKE ONE  (1) TABLET EACH DAY, Disp: 90 tablet, Rfl: 1 .  aspirin 325 MG tablet, Take 162.5 mg by mouth daily., Disp: , Rfl:  .  ATELVIA 35 MG TBEC, Take 35 mg by mouth once a week. monday, Disp: , Rfl:  .  Cholecalciferol (VITAMIN D3) 5000 UNITS CAPS, Take 1 capsule by mouth daily., Disp: , Rfl:  .  Cyanocobalamin (VITAMIN B 12 PO), Take 1,000 mcg by mouth daily., Disp: , Rfl:  .  fish oil-omega-3 fatty acids 1000 MG capsule, Take 2 g by mouth daily. , Disp: , Rfl:  .  losartan-hydrochlorothiazide (HYZAAR) 100-12.5 MG per tablet, TAKE ONE (1) TABLET EACH DAY, Disp: 90 tablet, Rfl: 1 .  Magnesium 500 MG CAPS, Take 1 capsule by mouth daily. , Disp: , Rfl:  .  Multiple Vitamin (MULTIVITAMIN) tablet, Take 1 tablet by mouth daily.  , Disp: , Rfl:  .  Multiple Vitamins-Minerals (PRESERVISION AREDS 2 PO), Take by mouth daily., Disp: , Rfl:  .  polyethylene glycol powder (MIRALAX) powder, Take 1 Container by mouth once. Per prep instructions, Disp: , Rfl:  .  pravastatin (PRAVACHOL) 20 MG tablet, TAKE ONE (1) TABLET EACH DAY, Disp: 30 tablet, Rfl: 11 .  Probiotic Product (PROBIOTIC DAILY PO), Take 1 tablet by mouth daily., Disp: , Rfl:  .  traZODone (DESYREL) 50 MG tablet, TAKE TWO TABLETS BY MOUTH AT BEDTIME DAILY, Disp: 180 tablet, Rfl: 1  EXAM:  Filed Vitals:   03/22/15 1441 03/22/15 1511  BP: 120/76 120/76  Pulse: 90   Temp: 98.3 F (36.8 C)     Body mass index is 28.01 kg/(m^2).  GENERAL: vitals reviewed and listed above, alert, oriented, appears well hydrated and in no acute distress  HEENT: atraumatic, conjunttiva clear, no obvious abnormalities on inspection of external nose and ears  NECK: no obvious masses on inspection  LUNGS: clear to auscultation bilaterally, no wheezes, rales or rhonchi, good air movement  CV: HRRR, no peripheral edema, no TTP over deep or superficial veins  MS: moves all extremities without noticeable abnormality, normal gait, L knee with surgical scar and abnormal  appearance of knee which she reports is chronic and sees specialist for, no swelling or redness, minimal TTP in calve muscles, strength and LE grossly intact bilat, NV intact bilat distally  SKIN: fine erythematous maculopapular rash on upper chest and neck only  PSYCH: pleasant and cooperative, no obvious depression or anxiety  ASSESSMENT AND PLAN:  Discussed the following assessment and plan:  Leg pain, bilateral -we discussed possible serious and likely etiologies, workup and treatment, treatment risks and return precautions  - suspect muscular -after this discussion, Marlene opted for stretching, heat, tylenol -follow up advised in 2-4 weeks if persists -of course, we advised Pecola  to return or notify a doctor immediately if symptoms worsen or persist or new concerns arise.  Rash and nonspecific skin eruption -appears to be contact eruption - but not sure what caused it - looks like rash I have seen with retinol creams but she denies use of antiaging creams on neck - reports uses them on the face only -reviewed PCP notes and see where rx for prednisone send with incorrect dose by staff -sent new rx with short taper -advised follow up with PCP in 2 weeks  -Patient advised to return or notify a doctor immediately if symptoms worsen or persist or new concerns arise.  Patient Instructions  BEFORE YOU LEAVE: -schedule follow up in 2 weeks with Dr. Elease Hashimoto rash, leg pain, HTN  For leg pain: -stretching as we discussed -heat -tylenol if needed per instructions  For the rash: -send new dose steroid to pharmacy per your request - take as instructed -follow up with your doctor in 2 weeks       KIM, Jarrett Soho R.

## 2015-03-22 NOTE — Telephone Encounter (Signed)
Patient has appt 03/22/15 with Dr. Maudie Mercury.

## 2015-03-22 NOTE — Progress Notes (Signed)
Pre visit review using our clinic review tool, if applicable. No additional management support is needed unless otherwise documented below in the visit note. 

## 2015-03-22 NOTE — Telephone Encounter (Signed)
Patient Name: Cassandra Foster  DOB: 12-18-1940    Initial Comment caller states she has pain in calves of her legs   Nurse Assessment  Nurse: Leilani Merl, RN, Heather Date/Time (Eastern Time): 03/22/2015 1:23:25 PM  Confirm and document reason for call. If symptomatic, describe symptoms. You must click the next button to save text entered. ---caller states she has pain in calves of her legs, this started a few days ago and has gotten worse yesterday. She has been sitting a lot for the last couple of weeks  Has the patient traveled out of the country within the last 30 days? ---Not Applicable  Does the patient have any new or worsening symptoms? ---Yes  Will a triage be completed? ---Yes  Related visit to physician within the last 2 weeks? ---No  Does the PT have any chronic conditions? (i.e. diabetes, asthma, etc.) ---Yes  List chronic conditions. ---see MR  Is this a behavioral health or substance abuse call? ---No     Guidelines    Guideline Title Affirmed Question Affirmed Notes  Leg Pain [1] Thigh, calf, or ankle swelling AND [2] only 1 side    Final Disposition User   See Physician within 4 Hours (or PCP triage) Leilani Merl, RN, Heather    Referrals  REFERRED TO PCP OFFICE   Disagree/Comply: Comply

## 2015-04-05 ENCOUNTER — Ambulatory Visit (INDEPENDENT_AMBULATORY_CARE_PROVIDER_SITE_OTHER): Payer: Medicare Other | Admitting: Family Medicine

## 2015-04-05 ENCOUNTER — Encounter: Payer: Self-pay | Admitting: Family Medicine

## 2015-04-05 VITALS — BP 142/80 | HR 75 | Temp 98.1°F | Ht 63.0 in | Wt 159.0 lb

## 2015-04-05 DIAGNOSIS — L299 Pruritus, unspecified: Secondary | ICD-10-CM

## 2015-04-05 NOTE — Progress Notes (Signed)
   Subjective:    Patient ID: Cassandra Foster, female    DOB: 12/31/1940, 75 y.o.   MRN: [id number]  HPI Patient seen recently for bilateral leg pain. That was felt to be overuse related. She did not note any swelling in her legs.  Those symptoms have now fully resolved.  She's had more chronic problem of diffuse itching mostly upper chest area. She initially had some rash under both breasts and that has cleared. She does not have any visible rashes at this time. She has noticed that heat and warm water exacerbate her itching. She was recently on oral prednisone taper and that did stop her itching temporarily. She has not tried any antihistamine. She uses Dial soap.  Past Medical History  Diagnosis Date  . Colon polyps   . Hyperlipidemia   . Hypertension   . Osteoporosis   . Chronic insomnia    Past Surgical History  Procedure Laterality Date  . Breast surgery      1985, 1997  . Knee surgery  2009    kneecap  . Other surgical history      childbirth x 3  . Tubal ligation      BP dropped   . Colonoscopy  multiple    reports that she has never smoked. She has never used smokeless tobacco. She reports that she does not drink alcohol or use illicit drugs. family history includes Cancer in her mother; Colon cancer (age of onset: 35) in her brother; Colon cancer (age of onset: 101) in her other; Prostate cancer in her brother; Stroke in her father. Allergies  Allergen Reactions  . Simvastatin Other (See Comments)    Body aches      Review of Systems  Constitutional: Negative for fever and chills.  Respiratory: Negative for shortness of breath.   Cardiovascular: Negative for chest pain.  Hematological: Negative for adenopathy.       Objective:   Physical Exam  Constitutional: She appears well-developed and well-nourished.  Cardiovascular: Normal rate and regular rhythm.   Pulmonary/Chest: Effort normal and breath sounds normal. No respiratory distress. She has no wheezes.  She has no rales.  Skin: No rash noted.          Assessment & Plan:  Persistent pruritus mostly involving her upper anterior trunk. No visible rash. Avoid prolonged bathing. Consider anti-itch soap such as Aveeno. Consider Claritin or Allegra plain. Avoid prolonged use of oral steroids

## 2015-04-05 NOTE — Progress Notes (Signed)
Pre visit review using our clinic review tool, if applicable. No additional management support is needed unless otherwise documented below in the visit note. 

## 2015-04-05 NOTE — Patient Instructions (Signed)
Try Aveeno soap for itching. May also consider plain Claritan or Allegra for recurrent itching.

## 2015-04-06 ENCOUNTER — Other Ambulatory Visit: Payer: Self-pay | Admitting: Family Medicine

## 2015-05-05 ENCOUNTER — Other Ambulatory Visit: Payer: Self-pay | Admitting: Family Medicine

## 2015-06-01 ENCOUNTER — Encounter (INDEPENDENT_AMBULATORY_CARE_PROVIDER_SITE_OTHER): Payer: Self-pay

## 2015-07-05 ENCOUNTER — Ambulatory Visit (INDEPENDENT_AMBULATORY_CARE_PROVIDER_SITE_OTHER): Payer: Medicare Other | Admitting: Family Medicine

## 2015-07-05 VITALS — HR 80 | Temp 98.2°F | Ht 63.0 in | Wt 160.3 lb

## 2015-07-05 DIAGNOSIS — M25562 Pain in left knee: Secondary | ICD-10-CM

## 2015-07-05 MED ORDER — MELOXICAM 15 MG PO TABS
15.0000 mg | ORAL_TABLET | Freq: Every day | ORAL | Status: DC
Start: 2015-07-05 — End: 2015-07-29

## 2015-07-05 NOTE — Progress Notes (Signed)
   Subjective:    Patient ID: Cassandra Foster, female    DOB: Jun 26, 1940, 75 y.o.   MRN: [id number]  HPI  patient seen with left knee pain.  She states she had patellar fracture 2009 with reconstructive surgery.  Couple weeks ago she was lifting a heavy clothes basket and noticed some pain in her left knee.  She's had some sensation of instability since then. She has pain when going downstairs.  She has not noted any visible swelling and has not had any warmth or erythema. She is able squat fully without difficulty. She's taken occasional ibuprofen without much improvement.  She had somewhat similar symptoms couple years ago and saw orthopedist and had repeat x-rays which showed no problems with her hardware. No recent fevers or chills.  Past Medical History  Diagnosis Date  . Colon polyps   . Hyperlipidemia   . Hypertension   . Osteoporosis   . Chronic insomnia    Past Surgical History  Procedure Laterality Date  . Breast surgery      1985, 1997  . Knee surgery  2009    kneecap  . Other surgical history      childbirth x 3  . Tubal ligation      BP dropped   . Colonoscopy  multiple    reports that she has never smoked. She has never used smokeless tobacco. She reports that she does not drink alcohol or use illicit drugs. family history includes Cancer in her mother; Colon cancer (age of onset: 84) in her brother; Colon cancer (age of onset: 66) in her other; Prostate cancer in her brother; Stroke in her father. Allergies  Allergen Reactions  . Simvastatin Other (See Comments)    Body aches  . Vit B12-Methionine-Inos-Chol     headaches      Review of Systems  Constitutional: Negative for fever and chills.  Neurological: Negative for weakness and numbness.       Objective:   Physical Exam  Constitutional: She appears well-developed and well-nourished.  Cardiovascular: Normal rate and regular rhythm.   Pulmonary/Chest: Effort normal and breath sounds normal. No  respiratory distress. She has no wheezes. She has no rales.  Musculoskeletal:  Knee full range of motion. She has scar from previous surgery. No erythema. No warmth. No effusion. No ecchymosis. No localized tenderness. Patella appears to be stable with no evidence for significant subluxation. She is able to do a full squat without difficulty          Assessment & Plan:  Left knee pain in a patient with prior history of patellar reconstruction. She has no evidence for instability on exam today. We've recommended a short-term trial of meloxicam for anti-inflammation effect 15 mg once daily with food. If no better in 2 weeks follow-up with her orthopedist for further evaluation  Eulas Post MD [place] [facility] at [facility]

## 2015-07-05 NOTE — Progress Notes (Signed)
   Subjective:    Patient ID: Cassandra Foster, female    DOB: [birth year], 75 y.o.   MRN: [id number]  HPI    Review of Systems  Cardiovascular: Negative for chest pain.  Neurological: Negative for weakness and numbness.       Objective:   Physical Exam        Assessment & Plan:

## 2015-07-05 NOTE — Progress Notes (Signed)
Pre visit review using our clinic review tool, if applicable. No additional management support is needed unless otherwise documented below in the visit note. 

## 2015-07-18 ENCOUNTER — Other Ambulatory Visit: Payer: Self-pay | Admitting: Family Medicine

## 2015-07-29 ENCOUNTER — Other Ambulatory Visit: Payer: Self-pay | Admitting: Family Medicine

## 2015-09-20 ENCOUNTER — Other Ambulatory Visit (INDEPENDENT_AMBULATORY_CARE_PROVIDER_SITE_OTHER): Payer: Medicare Other

## 2015-09-20 DIAGNOSIS — Z0000 Encounter for general adult medical examination without abnormal findings: Secondary | ICD-10-CM

## 2015-09-20 LAB — CBC WITH DIFFERENTIAL/PLATELET
BASOS ABS: 0 10*3/uL (ref 0.0–0.1)
Basophils Relative: 0.5 % (ref 0.0–3.0)
Eosinophils Absolute: 0.2 10*3/uL (ref 0.0–0.7)
Eosinophils Relative: 3.5 % (ref 0.0–5.0)
HCT: 38.5 % (ref 36.0–46.0)
Hemoglobin: 12.9 g/dL (ref 12.0–15.0)
LYMPHS ABS: 2 10*3/uL (ref 0.7–4.0)
Lymphocytes Relative: 31 % (ref 12.0–46.0)
MCHC: 33.6 g/dL (ref 30.0–36.0)
MCV: 90.8 fl (ref 78.0–100.0)
MONO ABS: 0.7 10*3/uL (ref 0.1–1.0)
Monocytes Relative: 10.2 % (ref 3.0–12.0)
NEUTROS PCT: 54.8 % (ref 43.0–77.0)
Neutro Abs: 3.5 10*3/uL (ref 1.4–7.7)
Platelets: 158 10*3/uL (ref 150.0–400.0)
RBC: 4.24 Mil/uL (ref 3.87–5.11)
RDW: 13 % (ref 11.5–15.5)
WBC: 6.4 10*3/uL (ref 4.0–10.5)

## 2015-09-20 LAB — LIPID PANEL
CHOL/HDL RATIO: 3
CHOLESTEROL: 151 mg/dL (ref 0–200)
HDL: 50.8 mg/dL (ref >39.00)
LDL CALC: 74 mg/dL (ref 0–99)
NONHDL: 100.44
Triglycerides: 131 mg/dL (ref 0.0–149.0)
VLDL: 26.2 mg/dL (ref 0.0–40.0)

## 2015-09-20 LAB — HEPATIC FUNCTION PANEL
ALK PHOS: 89 U/L (ref 39–117)
ALT: 25 U/L (ref 0–35)
AST: 21 U/L (ref 0–37)
Albumin: 4.4 g/dL (ref 3.5–5.2)
BILIRUBIN DIRECT: 0.1 mg/dL (ref 0.0–0.3)
BILIRUBIN TOTAL: 0.6 mg/dL (ref 0.2–1.2)
Total Protein: 6.6 g/dL (ref 6.0–8.3)

## 2015-09-20 LAB — BASIC METABOLIC PANEL
BUN: 13 mg/dL (ref 6–23)
CALCIUM: 9.7 mg/dL (ref 8.4–10.5)
CO2: 27 mEq/L (ref 19–32)
Chloride: 97 mEq/L (ref 96–112)
Creatinine, Ser: 0.66 mg/dL (ref 0.40–1.20)
GFR: 92.71 mL/min (ref >60.00)
GLUCOSE: 86 mg/dL (ref 70–99)
Potassium: 3.9 mEq/L (ref 3.5–5.1)
SODIUM: 131 meq/L — AB (ref 135–145)

## 2015-09-20 LAB — TSH: TSH: 1.92 u[IU]/mL (ref 0.35–4.50)

## 2015-09-20 LAB — VITAMIN D 25 HYDROXY (VIT D DEFICIENCY, FRACTURES): VITD: 46.97 ng/mL (ref 30.00–100.00)

## 2015-09-22 ENCOUNTER — Other Ambulatory Visit: Payer: Self-pay | Admitting: Family Medicine

## 2015-09-23 NOTE — Telephone Encounter (Signed)
Last refill 08/01/2015 #30, 1rf Last seen on 07/05/2015 knee pain Please advise on refills

## 2015-09-25 NOTE — Telephone Encounter (Signed)
Refill once.  Would try to avoid prolonged use, if possible, secondary to risks of NSAIDS long term in patients over 65.

## 2015-09-26 ENCOUNTER — Encounter: Payer: Self-pay | Admitting: Family Medicine

## 2015-09-26 ENCOUNTER — Ambulatory Visit (INDEPENDENT_AMBULATORY_CARE_PROVIDER_SITE_OTHER): Payer: Medicare Other | Admitting: Family Medicine

## 2015-09-26 VITALS — BP 140/70 | HR 76 | Temp 98.1°F | Ht 63.0 in | Wt 155.6 lb

## 2015-09-26 DIAGNOSIS — Z0000 Encounter for general adult medical examination without abnormal findings: Secondary | ICD-10-CM

## 2015-09-26 DIAGNOSIS — Z23 Encounter for immunization: Secondary | ICD-10-CM | POA: Diagnosis not present

## 2015-09-26 NOTE — Progress Notes (Signed)
Pre visit review using our clinic review tool, if applicable. No additional management support is needed unless otherwise documented below in the visit note. 

## 2015-09-26 NOTE — Patient Instructions (Signed)
-  Received Pneumovax today and this completes the series -Continue daily calcium and vitamin D and regular weightbearing exercise -Continue yearly mammograms

## 2015-09-26 NOTE — Addendum Note (Signed)
Addended by: Elio Forget on: 09/26/2015 11:54 AM   Modules accepted: Orders

## 2015-09-26 NOTE — Progress Notes (Signed)
Subjective:     Patient ID: Cassandra Foster, female   DOB: 1940-12-15, 75 y.o.   MRN: [id number]  HPI Patient is here for physical exam. She continues to be followed by gynecologist regularly.  She has history of benign colon polyps, mild hyponatremia, GERD, hypertension, osteoporosis, mild hyperlipidemia, chronic insomnia  She takes regular vitamin D and calcium. She is also on treatment for osteoporosis per gynecologist. She gets yearly mammograms. Tetanus up-to-date. She has already had previous Shingles vaccine. Needs Pneumovax. Had Prevnar last year  Past Medical History:  Diagnosis Date  . Chronic insomnia   . Colon polyps   . Hyperlipidemia   . Hypertension   . Osteoporosis    Past Surgical History:  Procedure Laterality Date  . [place]  . COLONOSCOPY  multiple  . KNEE SURGERY  2009   kneecap  . OTHER SURGICAL HISTORY     childbirth x 3  . TUBAL LIGATION     BP dropped     reports that she has never smoked. She has never used smokeless tobacco. She reports that she does not drink alcohol or use drugs. family history includes Cancer in her mother; Colon cancer (age of onset: 103) in her brother; Colon cancer (age of onset: 70) in her other; Prostate cancer in her brother; Stroke in her father. Allergies  Allergen Reactions  . Simvastatin Other (See Comments)    Body aches  . Vit B12-Methionine-Inos-Chol     headaches     Review of Systems  Constitutional: Negative for activity change, appetite change, fatigue, fever and unexpected weight change.  HENT: Negative for hearing loss, sore throat and trouble swallowing.   Respiratory: Negative for cough, shortness of breath and wheezing.   Cardiovascular: Negative for chest pain, palpitations and leg swelling.  Gastrointestinal: Negative for abdominal distention, abdominal pain, blood in stool, nausea and vomiting.  Endocrine: Negative for cold intolerance, polydipsia and polyuria.  Genitourinary:  Negative for dysuria and hematuria.  Musculoskeletal: Negative for gait problem and myalgias.  Skin: Negative for rash.  Neurological: Negative for dizziness, syncope, weakness and headaches.  Hematological: Negative for adenopathy.  Psychiatric/Behavioral: Negative for confusion and dysphoric mood.       Objective:   Physical Exam  Constitutional: She is oriented to person, place, and time. She appears well-developed and well-nourished.  HENT:  Head: Normocephalic and atraumatic.  Eyes: EOM are normal. Pupils are equal, round, and reactive to light.  Neck: Normal range of motion. Neck supple. No thyromegaly present.  Cardiovascular: Normal rate, regular rhythm and normal heart sounds.   No murmur heard. Pulmonary/Chest: Breath sounds normal. No respiratory distress. She has no wheezes. She has no rales.  Abdominal: Soft. Bowel sounds are normal. She exhibits no distension and no mass. There is no tenderness. There is no rebound and no guarding.  Genitourinary:  Genitourinary Comments: Per GYN  Musculoskeletal: Normal range of motion. She exhibits no edema.  Lymphadenopathy:    She has no cervical adenopathy.  Neurological: She is alert and oriented to person, place, and time. She displays normal reflexes. No cranial nerve deficit.  Skin: No rash noted.  Psychiatric: She has a normal mood and affect. Her behavior is normal. Judgment and thought content normal.       Assessment:     Physical exam. Patient needs Pneumovax today. Generally healthy with no acute problems.  She is getting regular health maintenance with regard to mammograms and colonoscopy.  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

## 2015-10-24 ENCOUNTER — Other Ambulatory Visit: Payer: Self-pay | Admitting: Family Medicine

## 2015-10-24 NOTE — Telephone Encounter (Signed)
Refill both for 6 months. 

## 2015-10-25 ENCOUNTER — Other Ambulatory Visit: Payer: Self-pay | Admitting: *Deleted

## 2015-10-25 MED ORDER — TRAZODONE HCL 50 MG PO TABS: ORAL_TABLET | ORAL | 5 refills | Status: DC

## 2015-10-25 NOTE — Telephone Encounter (Signed)
Rx refill sent to pharmacy. 

## 2015-12-20 ENCOUNTER — Other Ambulatory Visit: Payer: Self-pay | Admitting: Family Medicine

## 2016-01-16 ENCOUNTER — Other Ambulatory Visit: Payer: Self-pay | Admitting: Family Medicine

## 2016-02-01 ENCOUNTER — Ambulatory Visit (INDEPENDENT_AMBULATORY_CARE_PROVIDER_SITE_OTHER): Payer: Medicare Other | Admitting: Ophthalmology

## 2016-02-01 DIAGNOSIS — H33302 Unspecified retinal break, left eye: Secondary | ICD-10-CM | POA: Diagnosis not present

## 2016-02-01 DIAGNOSIS — I10 Essential (primary) hypertension: Secondary | ICD-10-CM

## 2016-02-01 DIAGNOSIS — H353112 Nonexudative age-related macular degeneration, right eye, intermediate dry stage: Secondary | ICD-10-CM | POA: Diagnosis not present

## 2016-02-01 DIAGNOSIS — H35033 Hypertensive retinopathy, bilateral: Secondary | ICD-10-CM

## 2016-02-01 DIAGNOSIS — H2513 Age-related nuclear cataract, bilateral: Secondary | ICD-10-CM

## 2016-02-01 DIAGNOSIS — H43813 Vitreous degeneration, bilateral: Secondary | ICD-10-CM | POA: Diagnosis not present

## 2016-02-01 DIAGNOSIS — D3132 Benign neoplasm of left choroid: Secondary | ICD-10-CM

## 2016-03-23 ENCOUNTER — Other Ambulatory Visit: Payer: Self-pay | Admitting: Family Medicine

## 2016-07-16 ENCOUNTER — Other Ambulatory Visit: Payer: Self-pay | Admitting: Family Medicine

## 2016-08-15 ENCOUNTER — Ambulatory Visit (INDEPENDENT_AMBULATORY_CARE_PROVIDER_SITE_OTHER): Payer: Medicare Other | Admitting: Family Medicine

## 2016-08-15 ENCOUNTER — Encounter: Payer: Self-pay | Admitting: Family Medicine

## 2016-08-15 VITALS — BP 140/80 | HR 84 | Temp 98.5°F | Wt 161.1 lb

## 2016-08-15 DIAGNOSIS — J209 Acute bronchitis, unspecified: Secondary | ICD-10-CM | POA: Diagnosis not present

## 2016-08-15 NOTE — Progress Notes (Signed)
Subjective:     Patient ID: Cassandra Foster, female   DOB: 1941/02/02, 76 y.o.   MRN: [id number]  HPI Patient seen for acute visit with onset last weekend of cough. She's had some mild nasal congestion. No fever. No dyspnea. No wheezing. She put out some type of ant killer which was in granular form on Saturday and initially worried that she may have inhaled something. She has no history of asthma. Nonsmoker. No body aches. No sore throat.  Past Medical History:  Diagnosis Date  . Chronic insomnia   . Colon polyps   . Hyperlipidemia   . Hypertension   . Osteoporosis    Past Surgical History:  Procedure Laterality Date  . [place]  . COLONOSCOPY  multiple  . KNEE SURGERY  2009   kneecap  . OTHER SURGICAL HISTORY     childbirth x 3  . TUBAL LIGATION     BP dropped     reports that she has never smoked. She has never used smokeless tobacco. She reports that she does not drink alcohol or use drugs. family history includes Cancer in her mother; Colon cancer (age of onset: 33) in her brother; Colon cancer (age of onset: 12) in her other; Prostate cancer in her brother; Stroke in her father. Allergies  Allergen Reactions  . Simvastatin Other (See Comments)    Body aches  . Vit B12-Methionine-Inos-Chol     headaches     Review of Systems  Constitutional: Negative for chills and fever.  HENT: Positive for congestion.   Respiratory: Positive for cough. Negative for shortness of breath and wheezing.   Cardiovascular: Negative for chest pain.       Objective:   Physical Exam  Constitutional: She appears well-developed and well-nourished.  HENT:  Right Ear: External ear normal.  Left Ear: External ear normal.  Mouth/Throat: Oropharynx is clear and moist.  Neck: Neck supple.  Cardiovascular: Normal rate and regular rhythm.   Pulmonary/Chest: Effort normal and breath sounds normal. No respiratory distress. She has no wheezes. She has no rales.   Musculoskeletal: She exhibits no edema.  Lymphadenopathy:    She has no cervical adenopathy.       Assessment:     Cough. Suspect acute viral bronchitis. Doubt related to chemical irritation    Plan:     -Try over-the-counter Mucinex -Stay well-hydrated -Follow-up promptly for any fever or worsening symptoms  Eulas Post MD [place] [facility] at [facility]

## 2016-08-15 NOTE — Patient Instructions (Signed)

## 2016-10-26 LAB — HM DIABETES EYE EXAM

## 2016-10-30 ENCOUNTER — Encounter: Payer: Self-pay | Admitting: Family Medicine

## 2016-10-30 ENCOUNTER — Other Ambulatory Visit: Payer: Self-pay | Admitting: *Deleted

## 2016-10-30 MED ORDER — LOSARTAN POTASSIUM-HCTZ 100-12.5 MG PO TABS: ORAL_TABLET | ORAL | 2 refills | Status: DC

## 2016-10-30 MED ORDER — MELOXICAM 15 MG PO TABS: ORAL_TABLET | ORAL | 0 refills | Status: DC

## 2016-10-30 MED ORDER — TRAZODONE HCL 50 MG PO TABS: ORAL_TABLET | ORAL | 50 refills | Status: DC

## 2016-10-30 MED ORDER — RISEDRONATE SODIUM 35 MG PO TBEC: DELAYED_RELEASE_TABLET | ORAL | 0 refills | Status: DC

## 2016-10-30 MED ORDER — AMLODIPINE BESYLATE 5 MG PO TABS: ORAL_TABLET | ORAL | 0 refills | Status: DC

## 2016-10-30 MED ORDER — PRAVASTATIN SODIUM 20 MG PO TABS: ORAL_TABLET | ORAL | 0 refills | Status: DC

## 2016-11-15 ENCOUNTER — Encounter: Payer: Self-pay | Admitting: Family Medicine

## 2017-01-04 ENCOUNTER — Ambulatory Visit: Payer: Medicare Other | Admitting: Family Medicine

## 2017-01-07 ENCOUNTER — Ambulatory Visit: Payer: Medicare Other | Admitting: Family Medicine

## 2017-01-07 ENCOUNTER — Encounter: Payer: Self-pay | Admitting: Family Medicine

## 2017-01-07 VITALS — BP 138/80 | HR 80 | Temp 98.2°F | Wt 157.4 lb

## 2017-01-07 DIAGNOSIS — M810 Age-related osteoporosis without current pathological fracture: Secondary | ICD-10-CM

## 2017-01-07 DIAGNOSIS — L219 Seborrheic dermatitis, unspecified: Secondary | ICD-10-CM | POA: Diagnosis not present

## 2017-01-07 MED ORDER — MOMETASONE FUROATE 0.1 % EX SOLN: Freq: Every day | CUTANEOUS | 2 refills | Status: DC

## 2017-01-07 NOTE — Patient Instructions (Signed)
Leave any alcohol or Listerine off scalp.   May apply the Elocon lotion once daily Continue with the conditioning shampoo.

## 2017-01-07 NOTE — Progress Notes (Signed)
Subjective:     Patient ID: Cassandra Foster, female   DOB: 1940-08-09, 76 y.o.   MRN: [id number]  HPI Patient seen today discuss several items  She's had some dental caries left lower molar and her dentist suggested she have tooth pulled. She was treated with amoxicillin. Was recommended she have a blood test as a marker for bone turnover because of the fact apparently she's been on Actonel for over 4 years per her GYN. She was here basically requesting this lab be done.  Other issue is pruritic scalp. She's had itching for several days now. She uses conditioning shampoo. She apparently tried topical Listerine to her scalp which seemed to help the itching transiently  Past Medical History:  Diagnosis Date  . Chronic insomnia   . Colon polyps   . Hyperlipidemia   . Hypertension   . Osteoporosis    Past Surgical History:  Procedure Laterality Date  . [place]  . COLONOSCOPY  multiple  . KNEE SURGERY  2009   kneecap  . OTHER SURGICAL HISTORY     childbirth x 3  . TUBAL LIGATION     BP dropped     reports that  has never smoked. she has never used smokeless tobacco. She reports that she does not drink alcohol or use drugs. family history includes Cancer in her mother; Colon cancer (age of onset: 63) in her brother; Colon cancer (age of onset: 12) in her other; Prostate cancer in her brother; Stroke in her father. Allergies  Allergen Reactions  . Simvastatin Other (See Comments)    Body aches  . Vit B12-Methionine-Inos-Chol     headaches     Review of Systems  Constitutional: Negative for chills and fever.  Respiratory: Negative for shortness of breath.   Cardiovascular: Negative for chest pain.  Genitourinary: Negative for dysuria.  Skin: Positive for rash.       Objective:   Physical Exam  Constitutional: She appears well-developed and well-nourished.  Cardiovascular: Normal rate and regular rhythm.  Pulmonary/Chest: Effort normal and breath  sounds normal. No respiratory distress. She has no wheezes. She has no rales.  Skin: Rash noted.  Has a few dry scaly patches on scalp- slightly excoriated.         Assessment:     #1 history of osteoporosis treated with bisphosphonate for several years  #2 pruritic scalp condition-probably eczema    Plan:     -We recommend she go to [facility] to have specialty lab drawn at the recommendation of her dentist -Avoid topical alcohol to scalp which could cause further drying -Continue with conditioning shampoo -Elocon lotion apply once daily to pruritic scalp as necessary -Consider OTC anti-seborrheic shampoo.  Eulas Post MD [place] [facility] at [facility]

## 2017-01-08 ENCOUNTER — Telehealth: Payer: Self-pay | Admitting: *Deleted

## 2017-01-08 NOTE — Telephone Encounter (Signed)
Copied from [place]. Topic: Referral - Status >> Jan 08, 2017  9:40 AM Ether Griffins B wrote: Reason for CRM: pt needing name of dentist. Was seen by dr burchette yesterday and forgot to get the name of dentist.

## 2017-01-08 NOTE — Telephone Encounter (Signed)
Patient is aware 

## 2017-01-08 NOTE — Telephone Encounter (Signed)
Dr Lenard Simmer at [place]

## 2017-01-15 ENCOUNTER — Ambulatory Visit (INDEPENDENT_AMBULATORY_CARE_PROVIDER_SITE_OTHER): Payer: Medicare Other | Admitting: Family Medicine

## 2017-01-15 ENCOUNTER — Encounter: Payer: Self-pay | Admitting: Family Medicine

## 2017-01-15 VITALS — BP 124/70 | HR 82 | Temp 97.9°F | Ht 63.0 in | Wt 159.3 lb

## 2017-01-15 DIAGNOSIS — Z0000 Encounter for general adult medical examination without abnormal findings: Secondary | ICD-10-CM

## 2017-01-15 DIAGNOSIS — E785 Hyperlipidemia, unspecified: Secondary | ICD-10-CM | POA: Diagnosis not present

## 2017-01-15 DIAGNOSIS — E559 Vitamin D deficiency, unspecified: Secondary | ICD-10-CM

## 2017-01-15 DIAGNOSIS — I10 Essential (primary) hypertension: Secondary | ICD-10-CM

## 2017-01-15 DIAGNOSIS — Z23 Encounter for immunization: Secondary | ICD-10-CM | POA: Diagnosis not present

## 2017-01-15 LAB — CBC WITH DIFFERENTIAL/PLATELET
BASOS PCT: 0.6 % (ref 0.0–3.0)
Basophils Absolute: 0.1 10*3/uL (ref 0.0–0.1)
EOS ABS: 0.2 10*3/uL (ref 0.0–0.7)
EOS PCT: 2 % (ref 0.0–5.0)
HCT: 40.2 % (ref 36.0–46.0)
HEMOGLOBIN: 13.6 g/dL (ref 12.0–15.0)
LYMPHS ABS: 2.4 10*3/uL (ref 0.7–4.0)
Lymphocytes Relative: 29.3 % (ref 12.0–46.0)
MCHC: 33.8 g/dL (ref 30.0–36.0)
MCV: 93.5 fl (ref 78.0–100.0)
MONO ABS: 0.8 10*3/uL (ref 0.1–1.0)
Monocytes Relative: 9.3 % (ref 3.0–12.0)
NEUTROS ABS: 4.8 10*3/uL (ref 1.4–7.7)
NEUTROS PCT: 58.8 % (ref 43.0–77.0)
PLATELETS: 175 10*3/uL (ref 150.0–400.0)
RBC: 4.3 Mil/uL (ref 3.87–5.11)
RDW: 12.7 % (ref 11.5–15.5)
WBC: 8.1 10*3/uL (ref 4.0–10.5)

## 2017-01-15 LAB — BASIC METABOLIC PANEL
BUN: 12 mg/dL (ref 6–23)
CALCIUM: 9.8 mg/dL (ref 8.4–10.5)
CO2: 25 meq/L (ref 19–32)
Chloride: 100 mEq/L (ref 96–112)
Creatinine, Ser: 0.58 mg/dL (ref 0.40–1.20)
GFR: 107.24 mL/min (ref >60.00)
Glucose, Bld: 92 mg/dL (ref 70–99)
POTASSIUM: 4 meq/L (ref 3.5–5.1)
SODIUM: 134 meq/L — AB (ref 135–145)

## 2017-01-15 LAB — LIPID PANEL
Cholesterol: 158 mg/dL (ref 0–200)
HDL: 57.1 mg/dL (ref >39.00)
LDL CALC: 75 mg/dL (ref 0–99)
NonHDL: 101.24
TRIGLYCERIDES: 132 mg/dL (ref 0.0–149.0)
Total CHOL/HDL Ratio: 3
VLDL: 26.4 mg/dL (ref 0.0–40.0)

## 2017-01-15 LAB — HEPATIC FUNCTION PANEL
ALT: 25 U/L (ref 0–35)
AST: 22 U/L (ref 0–37)
Albumin: 4.4 g/dL (ref 3.5–5.2)
Alkaline Phosphatase: 95 U/L (ref 39–117)
BILIRUBIN TOTAL: 0.7 mg/dL (ref 0.2–1.2)
Bilirubin, Direct: 0.1 mg/dL (ref 0.0–0.3)
Total Protein: 6.9 g/dL (ref 6.0–8.3)

## 2017-01-15 LAB — TSH: TSH: 1.34 u[IU]/mL (ref 0.35–4.50)

## 2017-01-15 LAB — VITAMIN D 25 HYDROXY (VIT D DEFICIENCY, FRACTURES): VITD: 37.56 ng/mL (ref 30.00–100.00)

## 2017-01-15 NOTE — Progress Notes (Addendum)
Subjective:     Patient ID: Cassandra Foster, female   DOB: 01-15-1941, 76 y.o.   MRN: [id number]  HPI Patient seen for physical exam. Her chronic problems include history of hypertension, GERD, osteoporosis, hyperlipidemia, family history of colon cancer in brother, and chronic insomnia. Generally feels well. She is due for repeat mammogram and is scheduling that soon. Immunizations up-to-date. She'll be due for repeat colonoscopy by next year.  Generally feels well with no specific complaints. Medications reviewed with no concerns.  Patient is nonsmoker. No alcohol use. No depression issues.  Medications reviewed. No complaints.  Remains on Pravachol for hyperlipidemia and Losartan HCTZ for hypertension.  Past Medical History:  Diagnosis Date  . Chronic insomnia   . Colon polyps   . Hyperlipidemia   . Hypertension   . Osteoporosis    Past Surgical History:  Procedure Laterality Date  . [place]  . COLONOSCOPY  multiple  . KNEE SURGERY  2009   kneecap  . OTHER SURGICAL HISTORY     childbirth x 3  . TUBAL LIGATION     BP dropped     reports that  has never smoked. she has never used smokeless tobacco. She reports that she does not drink alcohol or use drugs. family history includes Cancer in her mother; Colon cancer (age of onset: 30) in her brother; Colon cancer (age of onset: 23) in her other; Prostate cancer in her brother; Stroke in her father. Allergies  Allergen Reactions  . Simvastatin Other (See Comments)    Body aches  . Vit B12-Methionine-Inos-Chol     headaches     Review of Systems  Constitutional: Negative for activity change, appetite change, fatigue, fever and unexpected weight change.  HENT: Negative for ear pain, hearing loss, sore throat and trouble swallowing.   Eyes: Negative for visual disturbance.  Respiratory: Negative for cough and shortness of breath.   Cardiovascular: Negative for chest pain and palpitations.   Gastrointestinal: Negative for abdominal pain, blood in stool, constipation and diarrhea.  Genitourinary: Negative for dysuria and hematuria.  Musculoskeletal: Negative for arthralgias, back pain and myalgias.  Skin: Negative for rash.  Neurological: Negative for dizziness, syncope and headaches.  Hematological: Negative for adenopathy.  Psychiatric/Behavioral: Negative for confusion and dysphoric mood.       Objective:   Physical Exam  Constitutional: She is oriented to person, place, and time. She appears well-developed and well-nourished.  HENT:  Head: Normocephalic and atraumatic.  Eyes: EOM are normal. Pupils are equal, round, and reactive to light.  Neck: Normal range of motion. Neck supple. No thyromegaly present.  Cardiovascular: Normal rate, regular rhythm and normal heart sounds.  No murmur heard. Pulmonary/Chest: Breath sounds normal. No respiratory distress. She has no wheezes. She has no rales.  Abdominal: Soft. Bowel sounds are normal. She exhibits no distension and no mass. There is no tenderness. There is no rebound and no guarding.  Genitourinary:  Genitourinary Comments: Per gyn   Musculoskeletal: Normal range of motion. She exhibits no edema.  Lymphadenopathy:    She has no cervical adenopathy.  Neurological: She is alert and oriented to person, place, and time. She displays normal reflexes. No cranial nerve deficit.  Skin: No rash noted.  Psychiatric: She has a normal mood and affect. Her behavior is normal. Judgment and thought content normal.       Assessment:     #1 Physical exam. Following issues were addressed  #2 dyslipidemia- treated with  Pravastatin.  Check lipids- and hepatic with pt on statin.  #3 hypertension- stable on Losartan HCTZ.   Needs follow up BMP.       Plan:     -Flu vaccine already given -Patient setting up repeat mammogram -Obtain screening lab work -Continue regular weightbearing exercise. -Continue regular vitamin D and  calcium supplementation  Eulas Post MD [facility]

## 2017-02-07 ENCOUNTER — Ambulatory Visit (INDEPENDENT_AMBULATORY_CARE_PROVIDER_SITE_OTHER): Payer: Medicare Other | Admitting: Ophthalmology

## 2017-02-27 ENCOUNTER — Other Ambulatory Visit: Payer: Self-pay | Admitting: Family Medicine

## 2017-02-27 NOTE — Telephone Encounter (Signed)
Medication refill sent electronically to pharmacy.

## 2017-03-01 LAB — HM DEXA SCAN

## 2017-03-05 ENCOUNTER — Encounter: Payer: Self-pay | Admitting: Family Medicine

## 2017-03-12 ENCOUNTER — Encounter (INDEPENDENT_AMBULATORY_CARE_PROVIDER_SITE_OTHER): Payer: Medicare Other | Admitting: Ophthalmology

## 2017-03-12 DIAGNOSIS — H353112 Nonexudative age-related macular degeneration, right eye, intermediate dry stage: Secondary | ICD-10-CM | POA: Diagnosis not present

## 2017-03-12 DIAGNOSIS — D3132 Benign neoplasm of left choroid: Secondary | ICD-10-CM | POA: Diagnosis not present

## 2017-03-12 DIAGNOSIS — H353121 Nonexudative age-related macular degeneration, left eye, early dry stage: Secondary | ICD-10-CM

## 2017-03-12 DIAGNOSIS — H35033 Hypertensive retinopathy, bilateral: Secondary | ICD-10-CM

## 2017-03-12 DIAGNOSIS — I10 Essential (primary) hypertension: Secondary | ICD-10-CM

## 2017-03-12 DIAGNOSIS — H43813 Vitreous degeneration, bilateral: Secondary | ICD-10-CM

## 2017-03-19 ENCOUNTER — Ambulatory Visit (INDEPENDENT_AMBULATORY_CARE_PROVIDER_SITE_OTHER): Payer: Medicare Other | Admitting: Family Medicine

## 2017-03-19 ENCOUNTER — Encounter: Payer: Self-pay | Admitting: Family Medicine

## 2017-03-19 VITALS — BP 120/80 | HR 80 | Temp 98.1°F | Wt 161.8 lb

## 2017-03-19 DIAGNOSIS — M810 Age-related osteoporosis without current pathological fracture: Secondary | ICD-10-CM | POA: Diagnosis not present

## 2017-03-19 DIAGNOSIS — I10 Essential (primary) hypertension: Secondary | ICD-10-CM

## 2017-03-19 NOTE — Progress Notes (Signed)
Subjective:     Patient ID: Cassandra Foster, female   DOB: 05-04-1940, 77 y.o.   MRN: [id number]  HPI Patient is here to discuss osteoporosis. She's followed by gynecologist and states she is getting yearly DEXA scans there. She had recent bone density scan dated 03/01/17. This showed T score -2.6 lumbar spine and T score -2.7 left femoral neck. She states she had patellar fracture 2009 but no recent fractures. No recent falls. She stays fairly active but no formal exercise. Patient states she was treated with Fosamax several years ago but cannot give details regarding how long.  Positive family history of osteoporosis in a sister. Patient is nonsmoker.  She's been on Actonel 35 mg weekly per GYN but apparently until recently did not realize she was supposed to take this on empty stomach. She does take calcium and vitamin D regularly. She apparently has had consistent decline in bone density even on oral bisphosphonate. She does have history of some GERD but no active symptoms currently. Patient was told that she might need to consider possible injectable therapy.  Hypertension treated with Hyzaar. Stable. No headaches. No dizziness.  Past Medical History:  Diagnosis Date  . Chronic insomnia   . Colon polyps   . Hyperlipidemia   . Hypertension   . Osteoporosis    Past Surgical History:  Procedure Laterality Date  . [place]  . COLONOSCOPY  multiple  . KNEE SURGERY  2009   kneecap  . OTHER SURGICAL HISTORY     childbirth x 3  . TUBAL LIGATION     BP dropped     reports that  has never smoked. she has never used smokeless tobacco. She reports that she does not drink alcohol or use drugs. family history includes Cancer in her mother; Colon cancer (age of onset: 21) in her brother; Colon cancer (age of onset: 21) in her other; Prostate cancer in her brother; Stroke in her father. Allergies  Allergen Reactions  . Simvastatin Other (See Comments)    Body aches  .  Vit B12-Methionine-Inos-Chol     headaches     Review of Systems  Constitutional: Negative for appetite change, fatigue and unexpected weight change.  HENT: Negative for trouble swallowing.   Eyes: Negative for visual disturbance.  Respiratory: Negative for cough, chest tightness, shortness of breath and wheezing.   Cardiovascular: Negative for chest pain, palpitations and leg swelling.  Musculoskeletal: Negative for back pain.  Neurological: Negative for dizziness, seizures, syncope, weakness, light-headedness and headaches.       Objective:   Physical Exam  Constitutional: She appears well-developed and well-nourished.  Eyes: Pupils are equal, round, and reactive to light.  Neck: Neck supple. No JVD present. No thyromegaly present.  Cardiovascular: Normal rate and regular rhythm. Exam reveals no gallop.  Pulmonary/Chest: Effort normal and breath sounds normal. No respiratory distress. She has no wheezes. She has no rales.  Musculoskeletal: She exhibits no edema.  Neurological: She is alert.       Assessment:     #1 osteoporosis. Patient is been on bisphosphonate apparently for several years but has had continual gradual decline in bone density even on bisphosphonate.  #2 hypertension stable and at goal    Plan:     -Continue regular weightbearing exercise -Continue daily calcium and vitamin D -Discussed options for osteoporosis treatment. She has been on bisphosphonates as above apparently for several years and has had progression of osteoporosis. We discussed injectable  therapy such as Prolia or re-clast. She would like to consider Prolia if week and get this covered.  We also mentioned Evista but she is very concerned about potential for blood clot risk and also the fact we've not seen hip fracture prevention with this drug -She will need to have basic metabolic panel if week and get Prolia injections covered  Eulas Post MD [facility] at [facility]

## 2017-03-19 NOTE — Patient Instructions (Addendum)

## 2017-04-05 ENCOUNTER — Telehealth: Payer: Self-pay | Admitting: Family Medicine

## 2017-04-05 NOTE — Telephone Encounter (Signed)
Pt was called to schedule prolia injection. [place] of benefits was obtained from [place] assist and the estimated pt out of pocket expense is $70 every six months. Unable to speak with pt. A message was left with spouse to have pt call office back.

## 2017-04-09 ENCOUNTER — Telehealth: Payer: Self-pay | Admitting: Family Medicine

## 2017-04-09 NOTE — Telephone Encounter (Signed)
Spoke with pt and scheduled a nurse visit appointment for her Prolia injection for 04/16/17.

## 2017-04-15 NOTE — Telephone Encounter (Signed)
Pt has appt scheduled for tomorrow for injection but is calling to make sure grant for funding was approved.   Paty - Please contact pt. Thanks!

## 2017-04-16 ENCOUNTER — Ambulatory Visit (INDEPENDENT_AMBULATORY_CARE_PROVIDER_SITE_OTHER): Payer: Medicare Other | Admitting: Family Medicine

## 2017-04-16 DIAGNOSIS — M810 Age-related osteoporosis without current pathological fracture: Secondary | ICD-10-CM

## 2017-04-16 MED ORDER — DENOSUMAB 60 MG/ML ~~LOC~~ SOLN
60.0000 mg | Freq: Once | SUBCUTANEOUS | Status: AC
  Administered 2017-04-16: 60 mg via SUBCUTANEOUS

## 2017-04-16 NOTE — Progress Notes (Signed)
Per orders of Dr. Elease Hashimoto, injection of prolia given by Aggie Hacker ANN. Patient tolerated injection well.

## 2017-04-16 NOTE — Telephone Encounter (Signed)
Pt is responsible to set up the grant. She was given the number to contact them. The office has no affiliation with the grant.

## 2017-07-03 ENCOUNTER — Telehealth: Payer: Self-pay | Admitting: Family Medicine

## 2017-07-03 NOTE — Telephone Encounter (Signed)
Copied from [place] [id number]. Topic: Quick Communication - See Telephone Encounter >> Jul 03, 2017  2:56 PM Arletha Grippe wrote: CRM for notification. See Telephone encounter for: 07/03/17. Pt is requesting call back, said it was personal. Please call [phone] or [phone]

## 2017-07-03 NOTE — Telephone Encounter (Signed)
Patient's sister has moved here from [place].  She will need a new PCP.  Patient would like to know if will take her as a new patient or if you can recommend a new PCP?

## 2017-07-04 NOTE — Telephone Encounter (Signed)
One option might be our newest physician who I believe starts in July.  Could also give names of other providers in our group accepting new patients.

## 2017-07-05 NOTE — Telephone Encounter (Signed)
Left detailed message on machine for [facility]

## 2017-09-02 ENCOUNTER — Encounter: Payer: Self-pay | Admitting: Family Medicine

## 2017-09-02 ENCOUNTER — Ambulatory Visit: Payer: Medicare Other | Admitting: Family Medicine

## 2017-09-02 VITALS — BP 142/74 | HR 64 | Temp 97.9°F | Wt 161.8 lb

## 2017-09-02 DIAGNOSIS — M545 Low back pain, unspecified: Secondary | ICD-10-CM
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
## 2017-09-02 NOTE — Patient Instructions (Signed)
Let me know in two weeks if back pain not further improved. Would consider physical therapy if no better at that time.

## 2017-10-01 ENCOUNTER — Telehealth: Payer: Self-pay | Admitting: *Deleted

## 2017-10-01 NOTE — Telephone Encounter (Signed)
Waiting on insurance verification 10/01/17. Patient Prolia due 10/16/17

## 2017-10-08 ENCOUNTER — Encounter: Payer: Self-pay | Admitting: Family Medicine

## 2017-10-08 ENCOUNTER — Ambulatory Visit (INDEPENDENT_AMBULATORY_CARE_PROVIDER_SITE_OTHER): Payer: Medicare Other

## 2017-10-08 ENCOUNTER — Ambulatory Visit: Payer: Medicare Other | Admitting: Family Medicine

## 2017-10-08 VITALS — BP 120/70 | HR 86 | Temp 98.3°F | Wt 161.2 lb

## 2017-10-08 DIAGNOSIS — M25562 Pain in left knee: Secondary | ICD-10-CM

## 2017-10-08 NOTE — Patient Instructions (Signed)
Continue with icing 2-3 times daily as needed.  Your x-rays look pretty good- as far as patella goes.  Let me know in 2-3 weeks if knee pain not improving.

## 2017-10-08 NOTE — Progress Notes (Signed)
  Subjective:     Patient ID: Cassandra Foster, female   DOB: 1940-04-28, 77 y.o.   MRN: [id number]  HPI Patient seen with acute left knee pain. She states about 2 weeks ago she was going to get out of bed and was going to put weight on her elbow but her elbow was not planted firmly on the bed and she rolled off and landed onto both knees. She's had some anterior knee pain since then especially involving the left knee. She states that back in 2009 she had patellar fracture which required surgery. She's had some pain with ambulation since then but especially Sunday after doing increased walking at church. She has not noticed any visible bruising or swelling. She has used ice and taking ibuprofen up to 800 mg per dose which has helped somewhat. Denies any locking or giving way. She is not aware of any obvious patellar instability.  Past Medical History:  Diagnosis Date  . Chronic insomnia   . Colon polyps   . Hyperlipidemia   . Hypertension   . Osteoporosis    Past Surgical History:  Procedure Laterality Date  . BREAST SURGERY     19 85, 1997  . COLONOSCOPY  multiple  . KNEE SURGERY  2009   kneecap  . OTHER SURGICAL HISTORY     childbirth x 3  . TUBAL LIGATION     BP dropped     reports that she has never smoked. She has never used smokeless tobacco. She reports that she does not drink alcohol or use drugs. family history includes Cancer in her mother; Colon cancer (age of onset: 44) in her brother; Colon cancer (age of onset: 53) in her other; Prostate cancer in her brother; Stroke in her father. Allergies  Allergen Reactions  . Simvastatin Other (See Comments)    Body aches     Review of Systems  Neurological: Negative for dizziness, weakness and numbness.       Objective:   Physical Exam  Constitutional: She appears well-developed and well-nourished.  Cardiovascular: Normal rate and regular rhythm.  Pulmonary/Chest: Effort normal and breath sounds normal.  Musculoskeletal:   Left knee reveals a long scar from previous surgery. No ecchymosis. No erythema. No visible swelling. No effusion. Minimal patellar tenderness. She has mild medial joint line tenderness. Good range of motion left knee       Assessment:     Left anterior knee pain following fall. Has history about 10 years ago of left patellar fracture repair    Plan:     -Obtain x-rays left knee with sunrise view-hardware in place. No obvious fracture. This will be over read. She has some degenerative changes medial compartment greater than lateral -She'll continue with some icing and cautious use of ibuprofen but no prolonged use. -Avoid squatting. Touch base if pain not improving in next couple weeks  Eulas Post MD [facility]

## 2017-10-09 NOTE — Telephone Encounter (Signed)
Left message with husband. Patient can schedule her Prolia after 10/15/17.  Her estimated cost is $50 for prolia and $20 for the admin fee.  CRM created

## 2017-10-14 ENCOUNTER — Ambulatory Visit: Payer: Medicare Other

## 2017-10-14 NOTE — Telephone Encounter (Signed)
Patient is aware and an appointment made 

## 2017-10-15 ENCOUNTER — Ambulatory Visit: Payer: Medicare Other

## 2017-10-16 ENCOUNTER — Ambulatory Visit (INDEPENDENT_AMBULATORY_CARE_PROVIDER_SITE_OTHER): Payer: Medicare Other | Admitting: *Deleted

## 2017-10-16 DIAGNOSIS — M810 Age-related osteoporosis without current pathological fracture: Secondary | ICD-10-CM

## 2017-10-16 MED ORDER — DENOSUMAB 60 MG/ML ~~LOC~~ SOSY
60.0000 mg | PREFILLED_SYRINGE | Freq: Once | SUBCUTANEOUS | Status: AC
  Administered 2017-10-16: 60 mg via SUBCUTANEOUS

## 2017-10-16 NOTE — Progress Notes (Signed)
Per orders of Dr. Elease Hashimoto, injection of Prolia given by Dorrene German. Patient tolerated injection well.

## 2017-10-21 ENCOUNTER — Ambulatory Visit: Payer: Self-pay

## 2017-11-14 ENCOUNTER — Other Ambulatory Visit: Payer: Self-pay | Admitting: Family Medicine

## 2017-11-14 NOTE — Telephone Encounter (Signed)
Agree.  Don't see on list.  Does appear she has been on in past.  We will need to clarify with pt if she has been taking all along.  If so, add back to med list and refill for 6 months.

## 2017-11-14 NOTE — Telephone Encounter (Signed)
Rx not on pts current med list?

## 2017-11-15 NOTE — Telephone Encounter (Signed)
I called the pt and informed her of the message below.  Patient stated she has been taking this all along and is aware refills were sent to her pharmacy.

## 2017-11-22 ENCOUNTER — Other Ambulatory Visit: Payer: Self-pay | Admitting: Family Medicine

## 2017-12-28 ENCOUNTER — Other Ambulatory Visit: Payer: Self-pay | Admitting: Family Medicine

## 2018-01-17 ENCOUNTER — Encounter: Payer: Self-pay | Admitting: Family Medicine

## 2018-01-17 ENCOUNTER — Ambulatory Visit: Payer: Medicare Other | Admitting: Family Medicine

## 2018-01-17 VITALS — BP 120/60 | HR 82 | Temp 98.1°F | Ht 63.0 in | Wt 161.8 lb

## 2018-01-17 DIAGNOSIS — I10 Essential (primary) hypertension: Secondary | ICD-10-CM

## 2018-01-17 DIAGNOSIS — Z23 Encounter for immunization: Secondary | ICD-10-CM

## 2018-01-17 DIAGNOSIS — L600 Ingrowing nail: Secondary | ICD-10-CM | POA: Diagnosis not present

## 2018-01-17 DIAGNOSIS — E559 Vitamin D deficiency, unspecified: Secondary | ICD-10-CM | POA: Diagnosis not present

## 2018-01-17 DIAGNOSIS — E785 Hyperlipidemia, unspecified: Secondary | ICD-10-CM

## 2018-01-17 LAB — HEPATIC FUNCTION PANEL
ALT: 31 U/L (ref 0–35)
AST: 25 U/L (ref 0–37)
Albumin: 4.7 g/dL (ref 3.5–5.2)
Alkaline Phosphatase: 86 U/L (ref 39–117)
BILIRUBIN TOTAL: 0.6 mg/dL (ref 0.2–1.2)
Bilirubin, Direct: 0.1 mg/dL (ref 0.0–0.3)
Total Protein: 7.2 g/dL (ref 6.0–8.3)

## 2018-01-17 LAB — BASIC METABOLIC PANEL
BUN: 14 mg/dL (ref 6–23)
CO2: 25 mEq/L (ref 19–32)
Calcium: 9.6 mg/dL (ref 8.4–10.5)
Chloride: 101 mEq/L (ref 96–112)
Creatinine, Ser: 0.61 mg/dL (ref 0.40–1.20)
GFR: 100.91 mL/min (ref >60.00)
Glucose, Bld: 104 mg/dL — ABNORMAL HIGH (ref 70–99)
POTASSIUM: 3.7 meq/L (ref 3.5–5.1)
Sodium: 136 mEq/L (ref 135–145)

## 2018-01-17 LAB — LIPID PANEL
CHOL/HDL RATIO: 3
Cholesterol: 157 mg/dL (ref 0–200)
HDL: 57.1 mg/dL (ref >39.00)
LDL CALC: 78 mg/dL (ref 0–99)
NonHDL: 99.79
TRIGLYCERIDES: 107 mg/dL (ref 0.0–149.0)
VLDL: 21.4 mg/dL (ref 0.0–40.0)

## 2018-01-17 LAB — VITAMIN D 25 HYDROXY (VIT D DEFICIENCY, FRACTURES): VITD: 52.49 ng/mL (ref 30.00–100.00)

## 2018-01-17 NOTE — Addendum Note (Signed)
Addended by: Eulas Post on: 01/17/2018 09:32 AM   Modules accepted: Orders

## 2018-01-17 NOTE — Progress Notes (Signed)
  Subjective:     Patient ID: Cassandra Foster, female   DOB: April 01, 1940, 77 y.o.   MRN: [id number]  HPI  Patient here for several issues today as follows  Requesting tetanus booster.  Her last tetanus was 2009 and reportedly had Tdap then.  She has soreness right great toe along the medial border near the base.  She has had some mild swelling.  She has been using some warm salt water soaks and slightly improved.  No purulent drainage.  No injury.  Hypertension treated with amlodipine and losartan HCTZ.  No dizziness or headaches.  Hyperlipidemia treated with pravastatin.  She is due for follow-up lipids.  No myalgias.  Increased stress issues with husband who has chronic illness and she is also helping take care of her sister who has some early dementia.  She also has a son that has some healthcare needs and she helps transport him  Past Medical History:  Diagnosis Date  . Chronic insomnia   . Colon polyps   . Hyperlipidemia   . Hypertension   . Osteoporosis    Past Surgical History:  Procedure Laterality Date  . [place]  . COLONOSCOPY  multiple  . KNEE SURGERY  2009   kneecap  . OTHER SURGICAL HISTORY     childbirth x 3  . TUBAL LIGATION     BP dropped     reports that she has never smoked. She has never used smokeless tobacco. She reports that she does not drink alcohol or use drugs. family history includes Cancer in her mother; Colon cancer (age of onset: 18) in her brother; Colon cancer (age of onset: 58) in her other; Prostate cancer in her brother; Stroke in her father. Allergies  Allergen Reactions  . Simvastatin Other (See Comments)    Body aches    Review of Systems  Constitutional: Negative for fatigue and unexpected weight change.  Eyes: Negative for visual disturbance.  Respiratory: Negative for cough, chest tightness, shortness of breath and wheezing.   Cardiovascular: Negative for chest pain, palpitations and leg swelling.   Endocrine: Negative for polydipsia and polyuria.  Neurological: Negative for dizziness, seizures, syncope, weakness, light-headedness and headaches.       Objective:   Physical Exam  Constitutional: She appears well-developed and well-nourished.  Eyes: Pupils are equal, round, and reactive to light.  Neck: Neck supple. No JVD present. No thyromegaly present.  Cardiovascular: Normal rate and regular rhythm. Exam reveals no gallop.  Pulmonary/Chest: Effort normal and breath sounds normal. No respiratory distress. She has no wheezes. She has no rales.  Musculoskeletal: She exhibits no edema.  Neurological: She is alert.  Skin:  Right great toe reveals only very minimal inflammation along the medial border.  No pustular changes.  No drainage.  No cellulitis changes       Assessment:     #1 hypertension stable and at goal  #2 hyperlipidemia-treated with pravastatin  #3 ingrown right great toenail.  Mild symptoms at this point with no cellulitis changes    Plan:     -Tetanus booster given -Continue warm salt water soaks for right great foot.  May need to look at partial nail excision if symptoms persist or worsen -Check labs with lipid panel, hepatic panel, basic metabolic panel -Routine follow-up in 6 months and sooner as needed  Eulas Post MD [facility] at [facility]

## 2018-01-17 NOTE — Addendum Note (Signed)
Addended by: Agnes Lawrence on: 01/17/2018 09:10 AM   Modules accepted: Orders

## 2018-02-17 ENCOUNTER — Other Ambulatory Visit: Payer: Self-pay | Admitting: Family Medicine

## 2018-03-03 ENCOUNTER — Ambulatory Visit: Payer: Medicare Other | Admitting: Family Medicine

## 2018-03-03 ENCOUNTER — Encounter: Payer: Self-pay | Admitting: Family Medicine

## 2018-03-03 ENCOUNTER — Other Ambulatory Visit: Payer: Self-pay

## 2018-03-03 VITALS — BP 132/74 | HR 81 | Temp 98.1°F | Ht 63.0 in | Wt 159.7 lb

## 2018-03-03 DIAGNOSIS — J209 Acute bronchitis, unspecified: Secondary | ICD-10-CM

## 2018-03-03 MED ORDER — ALBUTEROL SULFATE HFA 108 (90 BASE) MCG/ACT IN AERS: 2.0000 | INHALATION_SPRAY | RESPIRATORY_TRACT | 0 refills | Status: DC | PRN

## 2018-03-03 NOTE — Patient Instructions (Signed)

## 2018-03-03 NOTE — Progress Notes (Signed)
  Subjective:     Patient ID: Cassandra Foster, female   DOB: Dec 15, 1940, 78 y.o.   MRN: [id number]  HPI Patient seen for follow-up regarding bronchial illness.  She states that back before Christmas about a week prior to then she developed some cough and hoarseness.  She went to urgent care up in [place] on 02/21/2018 and prescribed Zithromax and Tessalon and given a nebulizer.  She felt slightly better until the day but noticed episode of questionable wheezing earlier today.  No recurrent cough.  No dyspnea.  She is requesting albuterol inhaler.  No history of asthma.  She had some recent leg pain but that is fully resolved at this time.  No recent fever.  No recent chest pains  Past Medical History:  Diagnosis Date  . Chronic insomnia   . Colon polyps   . Hyperlipidemia   . Hypertension   . Osteoporosis    Past Surgical History:  Procedure Laterality Date  . [place]  . COLONOSCOPY  multiple  . KNEE SURGERY  2009   kneecap  . OTHER SURGICAL HISTORY     childbirth x 3  . TUBAL LIGATION     BP dropped     reports that she has never smoked. She has never used smokeless tobacco. She reports that she does not drink alcohol or use drugs. family history includes Cancer in her mother; Colon cancer (age of onset: 62) in her brother; Colon cancer (age of onset: 42) in an other family member; Prostate cancer in her brother; Stroke in her father. Allergies  Allergen Reactions  . Simvastatin Other (See Comments)    Body aches     Review of Systems  Constitutional: Negative for appetite change, chills, fever and unexpected weight change.  HENT: Negative for congestion.   Respiratory: Positive for wheezing. Negative for cough and shortness of breath.   Cardiovascular: Negative for chest pain.       Objective:   Physical Exam Constitutional:      Appearance: Normal appearance.  Cardiovascular:     Rate and Rhythm: Normal rate and regular rhythm.  Pulmonary:    Effort: Pulmonary effort is normal.     Breath sounds: Normal breath sounds. No wheezing or rales.  Musculoskeletal:     Right lower leg: No edema.     Left lower leg: No edema.  Neurological:     Mental Status: She is alert.        Assessment:     Patient had recent acute bronchitis.  Question of wheezing earlier today but none noted whatsoever on exam.  No respiratory distress.  Pulse oximetry 98%.    Plan:     -Prescription for albuterol MDI 2 puffs every 4-6 hours as needed for any recurrent wheezing.  We did not see any clear indication to start prednisone today as no wheezing noted on exam.  Follow-up for any fever, increased shortness of breath, or other concerns  Eulas Post MD [facility] at [facility]

## 2018-03-17 ENCOUNTER — Encounter (INDEPENDENT_AMBULATORY_CARE_PROVIDER_SITE_OTHER): Payer: Medicare Other | Admitting: Ophthalmology

## 2018-03-17 ENCOUNTER — Telehealth: Payer: Self-pay

## 2018-03-17 DIAGNOSIS — D3132 Benign neoplasm of left choroid: Secondary | ICD-10-CM

## 2018-03-17 DIAGNOSIS — H33302 Unspecified retinal break, left eye: Secondary | ICD-10-CM

## 2018-03-17 DIAGNOSIS — I10 Essential (primary) hypertension: Secondary | ICD-10-CM | POA: Diagnosis not present

## 2018-03-17 DIAGNOSIS — H43813 Vitreous degeneration, bilateral: Secondary | ICD-10-CM

## 2018-03-17 DIAGNOSIS — H353132 Nonexudative age-related macular degeneration, bilateral, intermediate dry stage: Secondary | ICD-10-CM

## 2018-03-17 DIAGNOSIS — H35033 Hypertensive retinopathy, bilateral: Secondary | ICD-10-CM

## 2018-03-17 NOTE — Telephone Encounter (Signed)
Please see message. Please advise.  Copied from [place] [phone]. Topic: Referral - Request for Referral >> Mar 17, 2018  8:26 AM Scherrie Gerlach wrote: Pt would like a referral to a dermatologist and a OBGYN.  Pt states her current drs have retired and she hopes Dr Elease Hashimoto knows someone he would refer her to in the [place] area.   She prefers not to just pick one out of the phone book. Pt states she has always seen these type of doctors.

## 2018-03-18 NOTE — Telephone Encounter (Signed)
Called patient and gave her referral information for Dr. Sandford Craze with [facility] at [phone] and instead of [name] patient preferred Cassandra Foster and I gave her Dr. Jetty Peeks at [facility] at [phone]. Patient verbalized an understanding and thanked me.

## 2018-03-18 NOTE — Telephone Encounter (Signed)
I don't think there are any dermatologists or GYN in the [facility]  Could recommend Dr Janeann Merl for gyn and I think there is a Dr Tarri Glenn (derm) in [place].

## 2018-04-04 ENCOUNTER — Telehealth: Payer: Self-pay | Admitting: *Deleted

## 2018-04-04 NOTE — Telephone Encounter (Signed)
Waiting on verification of insurance for Prolia injection Due after 04/20/2018

## 2018-04-29 NOTE — Telephone Encounter (Signed)
Left message on machine for patient to return our call CRM  Prolia injection due  Cost will be around $90 Okay to schedule if patient would like to continue with Prolia injections.

## 2018-05-02 NOTE — Telephone Encounter (Signed)
Spoke with patient and she would like to "think about continuing Prolia for a week and then call back".

## 2018-05-14 NOTE — Telephone Encounter (Signed)
Prolia is ready for patient to receive.

## 2018-05-16 ENCOUNTER — Other Ambulatory Visit: Payer: Self-pay | Admitting: Family Medicine

## 2018-05-19 ENCOUNTER — Ambulatory Visit: Payer: Medicare Other

## 2018-05-19 ENCOUNTER — Telehealth: Payer: Self-pay | Admitting: *Deleted

## 2018-05-19 NOTE — Telephone Encounter (Signed)
I recommend that we just wait.  I don't think waiting couple of months will be a problem  If delays extend longer we can address Fosamax of other alternative down the road.

## 2018-05-19 NOTE — Telephone Encounter (Signed)
RN contacted patient to cancel Proloia injection [place]. Patient would like to know can she take Fosamax for 4 weeks until she can come back in office to take Prolia? If so please send rx to patient preferred pharmacy.

## 2018-05-20 NOTE — Telephone Encounter (Signed)
Called patient to let her know Dr. Elease Hashimoto recommendation. Patient did not answer. Left a message for patient to return call. CRM created.

## 2018-05-22 ENCOUNTER — Other Ambulatory Visit: Payer: Self-pay | Admitting: Family Medicine

## 2018-06-09 ENCOUNTER — Telehealth: Payer: Self-pay | Admitting: *Deleted

## 2018-06-09 NOTE — Telephone Encounter (Signed)
Need to schedule awv no answer

## 2018-07-07 ENCOUNTER — Ambulatory Visit (INDEPENDENT_AMBULATORY_CARE_PROVIDER_SITE_OTHER): Payer: Medicare Other | Admitting: *Deleted

## 2018-07-07 ENCOUNTER — Other Ambulatory Visit: Payer: Self-pay

## 2018-07-07 DIAGNOSIS — M810 Age-related osteoporosis without current pathological fracture: Secondary | ICD-10-CM | POA: Diagnosis not present

## 2018-07-07 MED ORDER — DENOSUMAB 60 MG/ML ~~LOC~~ SOSY
60.0000 mg | PREFILLED_SYRINGE | Freq: Once | SUBCUTANEOUS | Status: AC
  Administered 2018-07-07: 60 mg via SUBCUTANEOUS

## 2018-07-15 NOTE — Progress Notes (Signed)
Agree with Prolia injection as given. Do recommend BMP and magnesium level prior to next injection of Prolia.    Eulas Post MD [facility]

## 2018-07-28 ENCOUNTER — Ambulatory Visit (INDEPENDENT_AMBULATORY_CARE_PROVIDER_SITE_OTHER): Payer: Medicare Other | Admitting: Family Medicine

## 2018-07-28 ENCOUNTER — Other Ambulatory Visit: Payer: Self-pay

## 2018-07-28 DIAGNOSIS — I878 Other specified disorders of veins: Secondary | ICD-10-CM

## 2018-07-28 NOTE — Progress Notes (Signed)
Patient ID: Cassandra Foster, female   DOB: [birth year], 78 y.o.   MRN: [id number]  This visit type was conducted due to national recommendations for restrictions regarding the COVID-19 pandemic in an effort to limit this patient's exposure and mitigate transmission in our community.   Virtual Visit via Telephone Note  I connected with Cassandra Foster on 07/28/18 at  2:15 PM EDT by telephone and verified that I am speaking with the correct person using two identifiers.   I discussed the limitations, risks, security and privacy concerns of performing an evaluation and management service by telephone and the availability of in person appointments. I also discussed with the patient that there may be a patient responsible charge related to this service. The patient expressed understanding and agreed to proceed.  Location patient: home Location provider: work or home office Participants present for the call: patient, provider Patient did not have a visit in the prior 7 days to address this/these issue(s).   History of Present Illness:  Patient states she noticed a blood vessel "popped out "on her right arm earlier this week.  She noticed some purplish discoloration involving what sounds like a vein.  This was nonpainful.  She noticed this incidentally after scratching an area of itch on her arm.  She is not noted any increased warmth.  Denies any edema of the hand, wrist, or forearm.  No injury.     Observations/Objective: Patient sounds cheerful and well on the phone. I do not appreciate any SOB. Speech and thought processing are grossly intact. Patient reported vitals:  Assessment and Plan: Prominent vessel right forearm.  This sounds like a benign vein.  She is not describing any phlebitis type symptoms nor any symptoms to suggest DVT such as edema of the upper extremity or any associated pain  -We recommend observation for now.  We have offered office follow-up if she has any concerns but she  states that the vessels less prominent today  Follow Up Instructions:  -As above   99441 5-10 99442 11-20 9443 21-30 I did not refer this patient for an OV in the next 24 hours for this/these issue(s).  I discussed the assessment and treatment plan with the patient. The patient was provided an opportunity to ask questions and all were answered. The patient agreed with the plan and demonstrated an understanding of the instructions.   The patient was advised to call back or seek an in-person evaluation if the symptoms worsen or if the condition fails to improve as anticipated.  I provided 15 minutes of non-face-to-face time during this encounter.   Carolann Littler, MD

## 2018-08-12 ENCOUNTER — Other Ambulatory Visit: Payer: Self-pay | Admitting: Family Medicine

## 2018-08-12 NOTE — Telephone Encounter (Signed)
[facility] would like to cancel losartan potassium request only

## 2018-08-22 ENCOUNTER — Other Ambulatory Visit: Payer: Self-pay | Admitting: Family Medicine

## 2018-08-22 NOTE — Telephone Encounter (Signed)
Pt calling to check on the status of this.  States she only has two pills left.

## 2018-11-13 ENCOUNTER — Other Ambulatory Visit: Payer: Self-pay | Admitting: Family Medicine

## 2018-11-19 ENCOUNTER — Other Ambulatory Visit: Payer: Self-pay | Admitting: Family Medicine

## 2018-12-03 ENCOUNTER — Telehealth: Payer: Self-pay | Admitting: Family Medicine

## 2018-12-03 NOTE — Telephone Encounter (Signed)
Copied from [id number]. Topic: General - Inquiry >> Dec 02, 2018  2:42 PM Richardo Priest, [place] wrote: Reason for CRM: Patient called in stating her knee popped yesterday and felt that it went backwards. Patient is wanting to know what could cause it. Patient stated it is not in pain or hot. Please advise. >> Dec 03, 2018  8:16 AM Cox, Melburn Hake, CMA wrote: Pt will need OV to discuss.

## 2018-12-05 NOTE — Telephone Encounter (Signed)
Pt was called but was unable to leave VM

## 2018-12-17 ENCOUNTER — Telehealth: Payer: Self-pay

## 2018-12-17 NOTE — Telephone Encounter (Signed)
Spoke with pt and she states she wants to do a telephone visit. This has been scheduled for this Friday

## 2018-12-17 NOTE — Telephone Encounter (Signed)
Copied from [place] [id number]. Topic: General - Inquiry >> Dec 17, 2018  1:37 PM [name] wrote: Reason for CRM: Patient called stating her right knee buckled and she is having issues, Patient did not want to make an appt.  Patient is requesting a call back from PCP nurse.  Call back [phone]

## 2018-12-17 NOTE — Telephone Encounter (Signed)
Recommend set up virtual.

## 2018-12-19 ENCOUNTER — Telehealth (INDEPENDENT_AMBULATORY_CARE_PROVIDER_SITE_OTHER): Payer: Medicare Other | Admitting: Family Medicine

## 2018-12-19 ENCOUNTER — Other Ambulatory Visit: Payer: Self-pay

## 2018-12-19 DIAGNOSIS — M25561 Pain in right knee: Secondary | ICD-10-CM | POA: Diagnosis not present

## 2018-12-19 NOTE — Progress Notes (Signed)
This visit type was conducted due to national recommendations for restrictions regarding the COVID-19 pandemic in an effort to limit this patient's exposure and mitigate transmission in our community.   Virtual Visit via Telephone Note  I connected with Cassandra Foster on 12/19/18 at  9:00 AM EDT by telephone and verified that I am speaking with the correct person using two identifiers.   I discussed the limitations, risks, security and privacy concerns of performing an evaluation and management service by telephone and the availability of in person appointments. I also discussed with the patient that there may be a patient responsible charge related to this service. The patient expressed understanding and agreed to proceed.  Location patient: home Location provider: work or home office Participants present for the call: patient, provider Patient did not have a visit in the prior 7 days to address this/these issue(s).   History of Present Illness: [place] called with episode of some recent right knee pain and "buckling "of her knee when she was going to the bathroom last night.  She relates about 3 weeks ago she had tree taken down and was carrying some wood down a heel and noticed bilateral knee aches afterwards.  Last night when she got up to go the bathroom she had a sensation of her patella moving out of place and she states that her knee "went backwards ".  She did not actually fall.  She has not had any sense of instability today.  No edema.  No bruising.   Observations/Objective: Patient sounds cheerful and well on the phone. I do not appreciate any SOB. Speech and thought processing are grossly intact. Patient reported vitals:  Assessment and Plan:  Right knee pain.  Question right patellar subluxation by history but no instability today with ambulation  -Avoid hills for now -Consider elastic knee sleeve for additional support -If she has further episodes of sense of instability we  recommend orthopedic follow-up/evaluation  Follow Up Instructions:  -As above   99441 5-10 99442 11-20 99443 21-30 I did not refer this patient for an OV in the next 24 hours for this/these issue(s).  I discussed the assessment and treatment plan with the patient. The patient was provided an opportunity to ask questions and all were answered. The patient agreed with the plan and demonstrated an understanding of the instructions.   The patient was advised to call back or seek an in-person evaluation if the symptoms worsen or if the condition fails to improve as anticipated.  I provided 14 minutes of non-face-to-face time during this encounter.   Carolann Littler, MD

## 2019-01-03 NOTE — Telephone Encounter (Signed)
Patient seen 12/19/2018

## 2019-01-05 ENCOUNTER — Telehealth: Payer: Self-pay | Admitting: Family Medicine

## 2019-01-06 MED ORDER — AMLODIPINE BESYLATE 5 MG PO TABS: 5.0000 mg | ORAL_TABLET | Freq: Every day | ORAL | 0 refills | Status: DC

## 2019-01-06 NOTE — Addendum Note (Signed)
Addended by: Benson Setting L on: 01/06/2019 05:23 PM   Modules accepted: Orders

## 2019-01-06 NOTE — Telephone Encounter (Signed)
Called pharmacy gave the verbal ok. They stated they needed a new rx. Sent refill in.

## 2019-01-06 NOTE — Telephone Encounter (Signed)
OK to refill the Amlodipine early.

## 2019-01-06 NOTE — Telephone Encounter (Signed)
Diane with [facility] stated pt is requesting refill early due to pt losing the medication. Diane requests that the refill request be approved

## 2019-01-08 ENCOUNTER — Ambulatory Visit: Payer: Self-pay

## 2019-01-08 ENCOUNTER — Other Ambulatory Visit: Payer: Self-pay

## 2019-01-08 ENCOUNTER — Ambulatory Visit (INDEPENDENT_AMBULATORY_CARE_PROVIDER_SITE_OTHER): Payer: Medicare Other | Admitting: Family Medicine

## 2019-01-08 ENCOUNTER — Ambulatory Visit (INDEPENDENT_AMBULATORY_CARE_PROVIDER_SITE_OTHER): Payer: Medicare Other

## 2019-01-08 ENCOUNTER — Encounter: Payer: Self-pay | Admitting: Family Medicine

## 2019-01-08 VITALS — BP 140/70 | HR 79 | Ht 63.0 in | Wt 156.8 lb

## 2019-01-08 DIAGNOSIS — M25561 Pain in right knee: Secondary | ICD-10-CM

## 2019-01-08 DIAGNOSIS — M25562 Pain in left knee: Secondary | ICD-10-CM | POA: Diagnosis not present

## 2019-01-08 MED ORDER — DICLOFENAC SODIUM 1 % EX GEL: 4.0000 g | Freq: Four times a day (QID) | CUTANEOUS | 11 refills | Status: AC

## 2019-01-08 NOTE — Progress Notes (Signed)
Subjective:    CC: B knee pain (R>L)  I, Cassandra Foster, LAT, ATC, am serving as scribe for Dr. Lynne Foster.  HPI: Pt is a 78 y/o female presenting w/ c/o R knee pain x approximately one month.  Pain initially began after carrying some wood down a hill after having a tree taken down at her house.  Most recently, her R knee gave way when she got up to go to the bathroom.  She states that it felt like her R patella shifted out of place and that her R knee "went backwards."  She denies any swelling or bruising.  Currently, she rates her R knee pain as a 3/10 at rest and a sharp 8/10 at it's worst.  Her pain is intermittent in nature.  She denies any swelling.  Pt notes some occasional mechanical symptoms.  Aggravating factors include climbing down stairs or going down a hill as well as prolonged standing.  Pt is currently wearing a knee brace on her R knee.   Incidentally also discussed with patient.  Her son recently had a drug overdose.  He was unresponsive and EMS was contacted and he eventually responded to Narcan nasal spray via EMS.  Past medical history, Surgical history, Family history not pertinant except as noted below, Social history, Allergies, and medications have been entered into the medical record, reviewed, and no changes needed.   Review of Systems: No headache, visual changes, nausea, vomiting, diarrhea, constipation, dizziness, abdominal pain, skin rash, fevers, chills, night sweats, weight loss, swollen lymph nodes, body aches, joint swelling, muscle aches, chest pain, shortness of breath, mood changes, visual or auditory hallucinations.   Objective:    Vitals:   01/08/19 1014  BP: 140/70  Pulse: 79  SpO2: 97%   General: Well Developed, well nourished, and in no acute distress.  Neuro/Psych: Alert and oriented x3, extra-ocular muscles intact, able to move all 4 extremities, sensation grossly intact. Skin: Warm and dry, no rashes noted.  Respiratory: Not using  accessory muscles, speaking in full sentences, trachea midline.  Cardiovascular: Pulses palpable, no extremity edema. Abdomen: Does not appear distended. MSK: Right knee no significant deformity visible. Range of motion 0-120 degrees with mild crepitations. Mildly tender palpation at superior portion of patella.  Also mildly tender to palpation at medial joint line.  Otherwise nontender. Stable ligamentous exam. Intact flexion and extension strength.  Left knee: No deformities otherwise normal. Not particular tender to palpation. Range of motion 0-120 degrees rotation. Stable ligamentous exam. Intact strength.  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  Calcific changes present at insertion of quad tendon on superior patellar pole indicating chronic calcific insertional tendinopathy.  No acute fractures visible.  Left knee: Intact unchanged appearance of surgical hardware screws at patella.  Degenerative changes present as well in medial compartment.  Await formal radiology review  Ultrasound not performed.  Impression and Recommendations:    Assessment and Plan: 78 y.o. female with bilateral right worse than left knee pain.  Multifactorial cause of pain.  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  After discussion of her options we will proceed with diclofenac gel and quad strengthening.  Continue knee brace as needed.  Recheck back in 4 weeks.  We will proceed with injection at that point if needed.  Patient declined steroid injection today wishing to try more conservative management if possible..  Additionally discussed her situation with her son who recently had a drug overdose.  I  discussed getting and potentially using if needed over-the-counter Narcan nasal spray and Nar-Anon support meetings and will advise discussion further with PCP if needed.  PDMP not reviewed this encounter. Orders Placed This Encounter  Procedures  . XR Knee Complete 4 Views Right    Please include patellar sunrise, lateral, and weightbearing bilateral AP and bilateral rosenberg views    Standing Status:   Future    Standing Expiration Date:   03/09/2020    Order Specific Question:   Reason for exam:    Answer:   Please include patellar sunrise, lateral, and weightbearing bilateral AP and bilateral rosenberg views    Comments:   Please include patellar sunrise, lateral, and weightbearing bilateral AP and bilateral rosenberg views    Order Specific Question:   Preferred imaging location?    Answer:   [facility]  . XR Knee 1-2 Views Left (for use with RIGHT Knee Complete)    Standing Status:   Future    Standing Expiration Date:   03/10/2020    Order Specific Question:   Reason for Exam (SYMPTOM  OR DIAGNOSIS REQUIRED)    Answer:   For use with right knee x-ray, bilateral AP and Rosenberg standing.    Order Specific Question:   Preferred imaging location?    Answer:   [facility]   No orders of the defined types were placed in this encounter.   Discussed warning signs or symptoms. Please see discharge instructions. Patient expresses understanding.   The above documentation has been reviewed and is accurate and complete Cassandra Foster

## 2019-01-08 NOTE — Patient Instructions (Addendum)
Thank you for coming in today. Use the voltaren gel for pain.  Work on home exercises.  Recheck in 4 month.   You can get Narcan Nasal Spray over the counter for your son.   Nar-Anon is a organization and support group for family member of people affected by addiction.   Please perform the exercise program that we have prepared for you and gone over in detail on a daily basis.  In addition to the handout you were provided you can access your program through: [email or web address]-exercise-code.com   Your unique program code is:  [facility]

## 2019-01-09 NOTE — Progress Notes (Signed)
X-ray right knee did show some arthritis but no fractures.

## 2019-01-09 NOTE — Addendum Note (Signed)
Addended by: Willy Eddy on: 01/09/2019 09:33 AM   Modules accepted: Orders

## 2019-01-14 ENCOUNTER — Telehealth: Payer: Self-pay | Admitting: *Deleted

## 2019-01-14 NOTE — Telephone Encounter (Signed)
Form faxed for insurance verification for Prolia.

## 2019-02-09 ENCOUNTER — Other Ambulatory Visit: Payer: Self-pay | Admitting: Family Medicine

## 2019-02-10 ENCOUNTER — Ambulatory Visit (INDEPENDENT_AMBULATORY_CARE_PROVIDER_SITE_OTHER): Payer: Medicare Other

## 2019-02-10 ENCOUNTER — Encounter: Payer: Self-pay | Admitting: Internal Medicine

## 2019-02-10 DIAGNOSIS — M810 Age-related osteoporosis without current pathological fracture: Secondary | ICD-10-CM | POA: Diagnosis not present

## 2019-02-10 DIAGNOSIS — Z1211 Encounter for screening for malignant neoplasm of colon: Secondary | ICD-10-CM | POA: Diagnosis not present

## 2019-02-10 DIAGNOSIS — Z0000 Encounter for general adult medical examination without abnormal findings: Secondary | ICD-10-CM

## 2019-02-10 DIAGNOSIS — Z1231 Encounter for screening mammogram for malignant neoplasm of breast: Secondary | ICD-10-CM | POA: Diagnosis not present

## 2019-02-10 NOTE — Patient Instructions (Addendum)
Cassandra Foster , Thank you for taking time to participate in your Medicare Wellness Visit. I appreciate your ongoing commitment to your health goals. Please review the following plan we discussed and let me know if I can assist you in the future.   Screening recommendations/referrals: Colorectal Screening: performed 03/09/2014; due again 03/10/2019. Referral sent to [facility] and they will contact you. Mammogram: performed 11/29/2014; past due; order sent to [facility]. They will contact you to schedule.  Bone Density: performed 03/01/2017; due 03/02/2019. Order sent to [facility] and they will contact you to schedule.   Vision and Dental Exams: Recommended annual ophthalmology exams for early detection of glaucoma and other disorders of the eye Recommended annual dental exams for proper oral hygiene  Diabetic Exams: Diabetic Eye Exam: N/A Diabetic Foot Exam: N/A  Vaccinations: Influenza vaccine: performed 11/13/2018. Repeat Fall 2021. Pneumococcal vaccine: performed 07/30/2013 and 09/26/2015. Tdap vaccine: performed 01/17/2018; up to date; due around 01/18/2028. Shingles vaccine: completed 08/05/2017 & 10/12/2017. Up to date. Advanced directives: Advance directives discussed with you today. I have provided a copy for you to complete at home and have notarized. Once this is complete please bring a copy in to our office so we can scan it into your chart.  Goals: Recommend to drink at least 6-8 8oz glasses of water per day.  Recommend physical exercise as you can tolerate.   Recommend to remove any items from the home that may cause slips or trips.  Recommend to decrease portion sizes by eating 3 small healthy meals and at least 2 healthy snacks per day.  Recommend to follow DASH diet as directed below (low sodium)  Next appointment: Please schedule your Annual Wellness Visit with your Nurse Health Advisor in one year.  Preventive Care 78 Years and Older,  Female Preventive care refers to lifestyle choices and visits with your health care provider that can promote health and wellness. What does preventive care include?  A yearly physical exam. This is also called an annual well check.  Dental exams once or twice a year.  Routine eye exams. Ask your health care provider how often you should have your eyes checked.  Personal lifestyle choices, including:  Daily care of your teeth and gums.  Regular physical activity.  Eating a healthy diet.  Avoiding tobacco and drug use.  Limiting alcohol use.  Practicing safe sex.  Taking low-dose aspirin every day if recommended by your health care provider.  Taking vitamin and mineral supplements as recommended by your health care provider. What happens during an annual well check? The services and screenings done by your health care provider during your annual well check will depend on your age, overall health, lifestyle risk factors, and family history of disease. Counseling  Your health care provider may ask you questions about your:  Alcohol use.  Tobacco use.  Drug use.  Emotional well-being.  Home and relationship well-being.  Sexual activity.  Eating habits.  History of falls.  Memory and ability to understand (cognition).  Work and work Statistician.  Reproductive health. Screening  You may have the following tests or measurements:  Height, weight, and BMI.  Blood pressure.  Lipid and cholesterol levels. These may be checked every 5 years, or more frequently if you are over 38 years old.  Skin check.  Lung cancer screening. You may have this screening every year starting at age 29 if you have a 30-pack-year history of smoking and currently smoke or have quit  within the past 15 years.  Fecal occult blood test (FOBT) of the stool. You may have this test every year starting at age 14.  Flexible sigmoidoscopy or colonoscopy. You may have a sigmoidoscopy every 5  years or a colonoscopy every 10 years starting at age 67.  Hepatitis C blood test.  Hepatitis B blood test.  Sexually transmitted disease (STD) testing.  Diabetes screening. This is done by checking your blood sugar (glucose) after you have not eaten for a while (fasting). You may have this done every 1-3 years.  Bone density scan. This is done to screen for osteoporosis. You may have this done starting at age 71.  Mammogram. This may be done every 1-2 years. Talk to your health care provider about how often you should have regular mammograms. Talk with your health care provider about your test results, treatment options, and if necessary, the need for more tests. Vaccines  Your health care provider may recommend certain vaccines, such as:  Influenza vaccine. This is recommended every year.  Tetanus, diphtheria, and acellular pertussis (Tdap, Td) vaccine. You may need a Td booster every 10 years.  Zoster vaccine. You may need this after age 15.  Pneumococcal 13-valent conjugate (PCV13) vaccine. One dose is recommended after age 40.  Pneumococcal polysaccharide (PPSV23) vaccine. One dose is recommended after age 49. Talk to your health care provider about which screenings and vaccines you need and how often you need them. This information is not intended to replace advice given to you by your health care provider. Make sure you discuss any questions you have with your health care provider. Document Released: 03/11/2015 Document Revised: 11/02/2015 Document Reviewed: 12/14/2014 Elsevier Interactive Patient Education  2017 [place] Prevention in the Home Falls can cause injuries. They can happen to people of all ages. There are many things you can do to make your home safe and to help prevent falls. What can I do on the outside of my home?  Regularly fix the edges of walkways and driveways and fix any cracks.  Remove anything that might make you trip as you walk through  a door, such as a raised step or threshold.  Trim any bushes or trees on the path to your home.  Use bright outdoor lighting.  Clear any walking paths of anything that might make someone trip, such as rocks or tools.  Regularly check to see if handrails are loose or broken. Make sure that both sides of any steps have handrails.  Any raised decks and porches should have guardrails on the edges.  Have any leaves, snow, or ice cleared regularly.  Use sand or salt on walking paths during winter.  Clean up any spills in your garage right away. This includes oil or grease spills. What can I do in the bathroom?  Use night lights.  Install grab bars by the toilet and in the tub and shower. Do not use towel bars as grab bars.  Use non-skid mats or decals in the tub or shower.  If you need to sit down in the shower, use a plastic, non-slip stool.  Keep the floor dry. Clean up any water that spills on the floor as soon as it happens.  Remove soap buildup in the tub or shower regularly.  Attach bath mats securely with double-sided non-slip rug tape.  Do not have throw rugs and other things on the floor that can make you trip. What can I do in the bedroom?  Use  night lights.  Make sure that you have a light by your bed that is easy to reach.  Do not use any sheets or blankets that are too big for your bed. They should not hang down onto the floor.  Have a firm chair that has side arms. You can use this for support while you get dressed.  Do not have throw rugs and other things on the floor that can make you trip. What can I do in the kitchen?  Clean up any spills right away.  Avoid walking on wet floors.  Keep items that you use a lot in easy-to-reach places.  If you need to reach something above you, use a strong step stool that has a grab bar.  Keep electrical cords out of the way.  Do not use floor polish or wax that makes floors slippery. If you must use wax, use  non-skid floor wax.  Do not have throw rugs and other things on the floor that can make you trip. What can I do with my stairs?  Do not leave any items on the stairs.  Make sure that there are handrails on both sides of the stairs and use them. Fix handrails that are broken or loose. Make sure that handrails are as long as the stairways.  Check any carpeting to make sure that it is firmly attached to the stairs. Fix any carpet that is loose or worn.  Avoid having throw rugs at the top or bottom of the stairs. If you do have throw rugs, attach them to the floor with carpet tape.  Make sure that you have a light switch at the top of the stairs and the bottom of the stairs. If you do not have them, ask someone to add them for you. What else can I do to help prevent falls?  Wear shoes that:  Do not have high heels.  Have rubber bottoms.  Are comfortable and fit you well.  Are closed at the toe. Do not wear sandals.  If you use a stepladder:  Make sure that it is fully opened. Do not climb a closed stepladder.  Make sure that both sides of the stepladder are locked into place.  Ask someone to hold it for you, if possible.  Clearly mark and make sure that you can see:  Any grab bars or handrails.  First and last steps.  Where the edge of each step is.  Use tools that help you move around (mobility aids) if they are needed. These include:  Canes.  Walkers.  Scooters.  Crutches.  Turn on the lights when you go into a dark area. Replace any light bulbs as soon as they burn out.  Set up your furniture so you have a clear path. Avoid moving your furniture around.  If any of your floors are uneven, fix them.  If there are any pets around you, be aware of where they are.  Review your medicines with your doctor. Some medicines can make you feel dizzy. This can increase your chance of falling. Ask your doctor what other things that you can do to help prevent falls. This  information is not intended to replace advice given to you by your health care provider. Make sure you discuss any questions you have with your health care provider. Document Released: 12/09/2008 Document Revised: 07/21/2015 Document Reviewed: 03/19/2014 Elsevier Interactive Patient Education  2017 [employer].

## 2019-02-10 NOTE — Progress Notes (Signed)
This visit is being conducted via phone call due to the COVID-19 pandemic. This patient has given me verbal consent via phone to conduct this visit, patient states they are participating from their home address. Some vital signs may be absent or patient reported.   Patient identification: identified by name, DOB, and current address.  Location provider: [facility], Office Persons participating in the virtual visit: Cassandra Foster and Cassandra Forts, LPN.    Subjective:   Cassandra Foster is a 78 y.o. female who presents for Medicare Annual (Subsequent) preventive examination.  Cassandra Foster reports that she is very active at home and helps with her husband and grandchildren. She stated that she has significant right knee pain when she "over does" it. She reported that it is difficult to exercise due to this.   Review of Systems:   Cardiac Risk Factors include: advanced age (>54men, >21 women);dyslipidemia;sedentary lifestyle;hypertension     Objective:     Vitals: BP (!) 143/66 (Patient Position: Sitting) Comment: patient reported  Pulse 83   Ht 5\' 3"  (1.6 m)   Wt 163 lb (73.9 kg)   BMI 28.87 kg/m   Body mass index is 28.87 kg/m.  Advanced Directives 02/10/2019 03/09/2014 02/24/2014  Does Patient Have a Medical Advance Directive? No Yes Yes  Type of Advance Directive - Healthcare Power of Cassandra Foster;Living will  Would patient like information on creating a medical advance directive? Yes (MAU/Ambulatory/Procedural Areas - Information given) - -    Tobacco Social History   Tobacco Use  Smoking Status Never Smoker  Smokeless Tobacco Never Used     Counseling given: Not Answered   Clinical Intake:  Pre-visit preparation completed: Yes  Pain : No/denies pain     Nutritional Status: BMI 25 -29 Overweight Nutritional Risks: None Diabetes: No  How often do you need to have someone help you when you read instructions, pamphlets, or  other written materials from your doctor or pharmacy?: 1 - Never What is the last grade level you completed in school?: high school graduate  Interpreter Needed?: No  Information entered by :: Cassandra Forts, LPN.  Past Medical History:  Diagnosis Date  . Chronic insomnia   . Colon polyps   . Hyperlipidemia   . Hypertension   . Osteoporosis    Past Surgical History:  Procedure Laterality Date  . [place]  . COLONOSCOPY  multiple  . KNEE SURGERY  2009   kneecap  . OTHER SURGICAL HISTORY     childbirth x 3  . TUBAL LIGATION     BP dropped    Family History  Problem Relation Age of Onset  . Cancer Mother        breast  . Stroke Father   . Colon cancer Brother 66  . Prostate cancer Brother   . Colon cancer Other 25   Social History   Socioeconomic History  . Marital status: Married    Spouse name: Not on file  . Number of children: 3  . Years of education: high school graduate  . Highest education level: High school graduate  Occupational History  . Occupation: retired  Tobacco Use  . Smoking status: Never Smoker  . Smokeless tobacco: Never Used  Substance and Sexual Activity  . Alcohol use: No  . Drug use: No  . Sexual activity: Not on file  Other Topics Concern  . Not on file  Social History Narrative   Retired  Married   3 sons   5 grand-children   Social Determinants of Health   Financial Resource Strain:   . Difficulty of Paying Living Expenses: Not on file  Food Insecurity:   . Worried About Charity fundraiser in the Last Year: Not on file  . Ran Out of Food in the Last Year: Not on file  Transportation Needs:   . Lack of Transportation (Medical): Not on file  . Lack of Transportation (Non-Medical): Not on file  Physical Activity:   . Days of Exercise per Week: Not on file  . Minutes of Exercise per Session: Not on file  Stress:   . Feeling of Stress : Not on file  Social Connections:   . Frequency of  Communication with Friends and Family: Not on file  . Frequency of Social Gatherings with Friends and Family: Not on file  . Attends Religious Services: Not on file  . Active Member of Clubs or Organizations: Not on file  . Attends Archivist Meetings: Not on file  . Marital Status: Not on file    Outpatient Encounter Medications as of 02/10/2019  Medication Sig  . albuterol (PROVENTIL HFA;VENTOLIN HFA) 108 (90 Base) MCG/ACT inhaler Inhale 2 puffs into the lungs every 4 (four) hours as needed for wheezing or shortness of breath.  Marland Kitchen amLODipine (NORVASC) 5 MG tablet Take 1 tablet (5 mg total) by mouth daily.  Marland Kitchen aspirin EC 81 MG tablet 81 mg. Take 3 tabs daily  . Cholecalciferol (VITAMIN D3) 5000 UNITS CAPS Take 1 capsule by mouth daily.  Marland Kitchen denosumab (PROLIA) 60 MG/ML SOSY injection Inject 60 mg into the skin every 6 (six) months.  . diclofenac Sodium (VOLTAREN) 1 % GEL Apply 4 g topically 4 (four) times daily. To affected joint.  Marland Kitchen losartan-hydrochlorothiazide (HYZAAR) 100-12.5 MG tablet TAKE 1 TABLET DAILY  . mometasone (ELOCON) 0.1 % lotion Apply daily topically.  . Multiple Vitamins-Minerals (PRESERVISION AREDS 2 PO) Take by mouth daily.  . pravastatin (PRAVACHOL) 20 MG tablet TAKE 1 TABLET DAILY  . traZODone (DESYREL) 50 MG tablet TAKE 2 TABLETS AT BEDTIME  . fish oil-omega-3 fatty acids 1000 MG capsule Take 2 g by mouth daily.   . Risedronate Sodium 35 MG TBEC TAKE 1 TABLET EVERY WEEK (Patient not taking: Reported on 02/10/2019)   No facility-administered encounter medications on file as of 02/10/2019.    Activities of Daily Living In your present state of health, do you have any difficulty performing the following activities: 02/10/2019  Hearing? N  Vision? N  Difficulty concentrating or making decisions? N  Walking or climbing stairs? N  Dressing or bathing? N  Doing errands, shopping? N  Preparing Food and eating ? N  Using the Toilet? N  In the past six months,  have you accidently leaked urine? Y  Do you have problems with loss of bowel control? N  Managing your Medications? N  Managing your Finances? N  Housekeeping or managing your Housekeeping? N  Some recent data might be hidden    Patient Care Team: Eulas Post, MD as PCP - General (Family Medicine)    Assessment:   This is a routine wellness examination for Rashelle.  Exercise Activities and Dietary recommendations Current Exercise Habits: The patient does not participate in regular exercise at present, Exercise limited by: orthopedic condition(s)  Goals    . Cut out extra servings    . Cut out extra servings       Fall  Risk Fall Risk  02/10/2019 10/08/2017 01/07/2017 09/26/2015 09/23/2014  Falls in the past year? 0 Yes No No No  Number falls in past yr: 0 1 - - -  Risk for fall due to : Medication side effect;Orthopedic patient - - - -  Follow up Falls evaluation completed;Education provided;Falls prevention discussed - - - -   Is the patient's home free of loose throw rugs in walkways, pet beds, electrical cords, etc?   yes      Grab bars in the bathroom? yes      Handrails on the stairs?   yes      Adequate lighting?   yes  Timed Get Up and Go performed: N/A due to telephone visit  Depression Screen PHQ 2/9 Scores 02/10/2019 01/07/2017 09/26/2015 09/23/2014  PHQ - 2 Score 0 0 0 0     Cognitive Function     6CIT Screen 02/10/2019  What Year? 0 points  What month? 0 points  What time? 0 points  Count back from 20 0 points  Months in reverse 0 points  Repeat phrase 0 points  Total Score 0    Immunization History  Administered Date(s) Administered  . Fluad Quad(high Dose 65+) 11/13/2018  . Influenza Split 11/30/2010, 01/10/2012  . Influenza, High Dose Seasonal PF 12/19/2012, 12/30/2013, 01/15/2017  . Influenza,inj,Quad PF,6+ Mos 12/05/2017  . Influenza-Unspecified 03/14/2015, 12/05/2017  . Pneumococcal Conjugate-13 07/30/2013  . Pneumococcal  Polysaccharide-23 09/26/2015  . Td 01/17/2018  . Tdap 04/26/2007  . Zoster Recombinat (Shingrix) 08/05/2017, 10/12/2017    Qualifies for Shingles Vaccine?Patient has already completed  Screening Tests Health Maintenance  Topic Date Due  . COLONOSCOPY  03/10/2019  . TETANUS/TDAP  01/18/2028  . INFLUENZA VACCINE  Completed  . DEXA SCAN  Completed  . PNA vac Low Risk Adult  Completed    Cancer Screenings: Lung: Low Dose CT Chest recommended if Age 33-80 years, 30 pack-year currently smoking OR have quit w/in 15years. Patient does not qualify. Breast:  Up to date on Mammogram? No, order [facility]  Up to date of Bone Density/Dexa? Yes but order sent to [facility] since she's due for repeat in Jan 2021. Colorectal: Referral sent to [facility] as she is due for repeat colonoscopy in Jan 2021.  Additional Screenings:  Hepatitis C Screening: N/A due to age     Plan:   Discussed having a telephone visit with Dr. Elease Hashimoto for routine follow up and medication refills. She was in agreement. She is up to date with preventative immunizations.    I have personally reviewed and noted the following in the patient's chart:   . Medical and social history . Use of alcohol, tobacco or illicit drugs  . Current medications and supplements . Functional ability and status . Nutritional status . Physical activity . Advanced directives . List of other physicians . Hospitalizations, surgeries, and ER visits in previous 12 months . Vitals . Screenings to include cognitive, depression, and falls . Referrals and appointments  In addition, I have reviewed and discussed with patient certain preventive protocols, quality metrics, and best practice recommendations. A written personalized care plan for preventive services as well as general preventive health recommendations were provided to patient.     Cassandra Forts, LPN  [id number]

## 2019-02-12 ENCOUNTER — Ambulatory Visit: Payer: Medicare Other | Admitting: Family Medicine

## 2019-02-25 ENCOUNTER — Telehealth: Payer: Self-pay | Admitting: *Deleted

## 2019-02-25 NOTE — Telephone Encounter (Signed)
Verification of insurance started for Prolia injection

## 2019-03-10 ENCOUNTER — Telehealth: Payer: Self-pay | Admitting: *Deleted

## 2019-03-10 NOTE — Telephone Encounter (Signed)
Copied from [place] [id number]. Topic: General - Other >> Mar 10, 2019 12:14 PM Celene Kras wrote: Reason for CRM: Pt called and is needing to have the pt is needing to have her medical records from 2018-2020 for insurance purposes. Pt states she is needing these asap. Pt is also wanting to get her prolia shot. Please advise.

## 2019-03-12 ENCOUNTER — Telehealth: Payer: Self-pay | Admitting: Family Medicine

## 2019-03-12 NOTE — Telephone Encounter (Signed)
Pt came in wanting to know if she is eligible for her Prolia injection stated that is should had been in November.  Pt would like a call [phone].

## 2019-03-13 NOTE — Telephone Encounter (Signed)
Spoke with [employer] and verification will be done Stat.

## 2019-03-13 NOTE — Telephone Encounter (Signed)
Spoke with [employer] and insurance verification will be done Stat.

## 2019-03-19 NOTE — Telephone Encounter (Signed)
Spoke with patient and the cost will be approximately $260. Patient will call the [facility] and then call back.

## 2019-03-23 ENCOUNTER — Encounter (INDEPENDENT_AMBULATORY_CARE_PROVIDER_SITE_OTHER): Payer: Medicare Other | Admitting: Ophthalmology

## 2019-03-24 ENCOUNTER — Encounter: Payer: Medicare Other | Admitting: Internal Medicine

## 2019-03-31 ENCOUNTER — Other Ambulatory Visit: Payer: Self-pay | Admitting: Family Medicine

## 2019-04-14 NOTE — Telephone Encounter (Signed)
Left message with husband to see if the patient has new insurance or to see if patient received help with the cost of Prolia.

## 2019-05-05 ENCOUNTER — Telehealth: Payer: Self-pay | Admitting: Family Medicine

## 2019-05-05 NOTE — Telephone Encounter (Signed)
Pt is needing help with a shot that is given for her bones. Her insurance no longer covers it and she was told there was a grant she could apply to receive it. Pt would like a call back.   Patient Phone: [phone]

## 2019-05-05 NOTE — Telephone Encounter (Signed)
Information given to patient about the grant to help pay for Prolia

## 2019-05-05 NOTE — Telephone Encounter (Signed)
This looks like it is for Prolia.

## 2019-05-05 NOTE — Telephone Encounter (Signed)
Information given to patient about the grant to help pay for Prolia.

## 2019-05-08 ENCOUNTER — Other Ambulatory Visit: Payer: Self-pay | Admitting: Family Medicine

## 2019-05-26 ENCOUNTER — Ambulatory Visit
Admission: RE | Admit: 2019-05-26 | Discharge: 2019-05-26 | Disposition: A | Discharge status: Home / Self Care | Payer: Medicare PPO | Source: Ambulatory Visit | Attending: Family Medicine | Admitting: Family Medicine

## 2019-05-26 ENCOUNTER — Other Ambulatory Visit: Payer: Self-pay

## 2019-05-26 ENCOUNTER — Other Ambulatory Visit: Payer: Self-pay | Admitting: Family Medicine

## 2019-05-26 DIAGNOSIS — Z780 Asymptomatic menopausal state: Secondary | ICD-10-CM | POA: Diagnosis not present

## 2019-05-26 DIAGNOSIS — Z1231 Encounter for screening mammogram for malignant neoplasm of breast: Secondary | ICD-10-CM

## 2019-05-26 DIAGNOSIS — M810 Age-related osteoporosis without current pathological fracture: Secondary | ICD-10-CM | POA: Diagnosis not present

## 2019-05-26 DIAGNOSIS — M8588 Other specified disorders of bone density and structure, other site: Secondary | ICD-10-CM | POA: Diagnosis not present

## 2019-05-26 IMAGING — MG DIGITAL SCREENING BILAT W/ TOMO W/ CAD
8 series · 8 of 24 positions shown · non-contrast
Comparison: Previous exam(s).

CLINICAL DATA: Screening.

EXAM:
DIGITAL SCREENING BILATERAL MAMMOGRAM WITH TOMO AND CAD

[L MLO synth-2D]
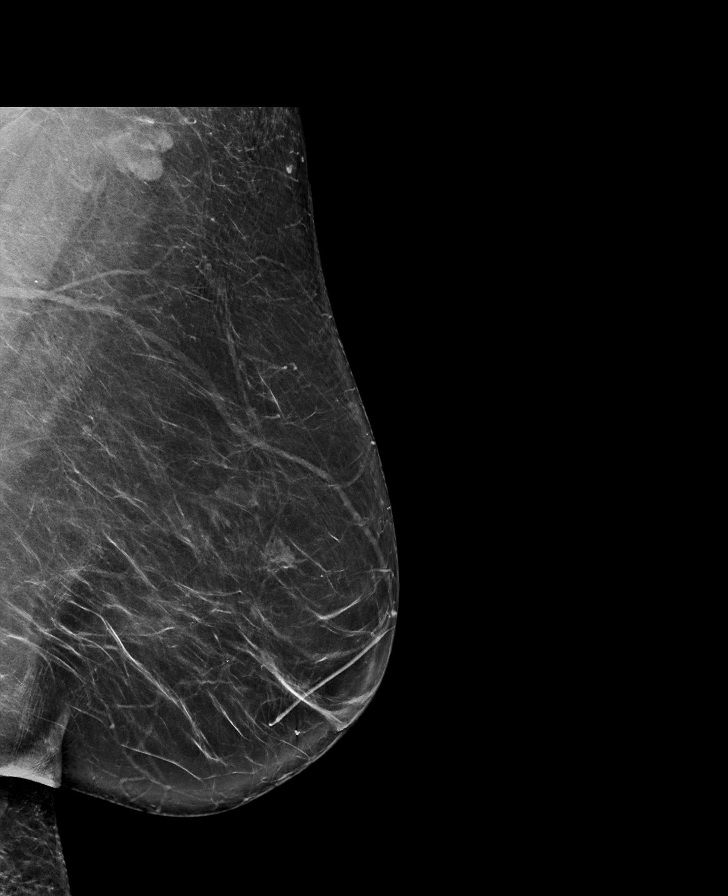

[L CC synth-2D]
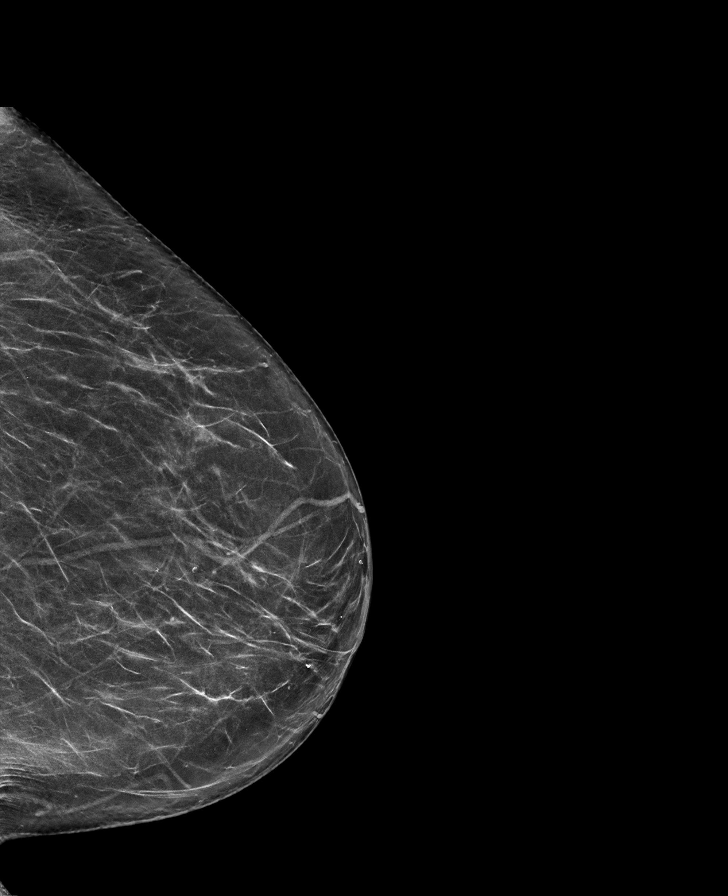

[R CC synth-2D]
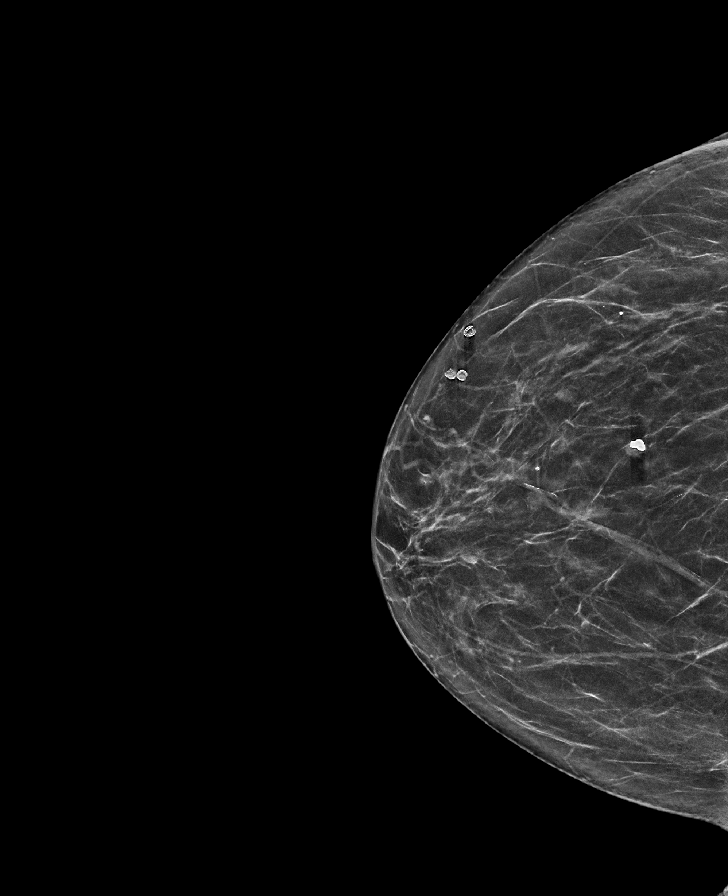

[R MLO synth-2D]
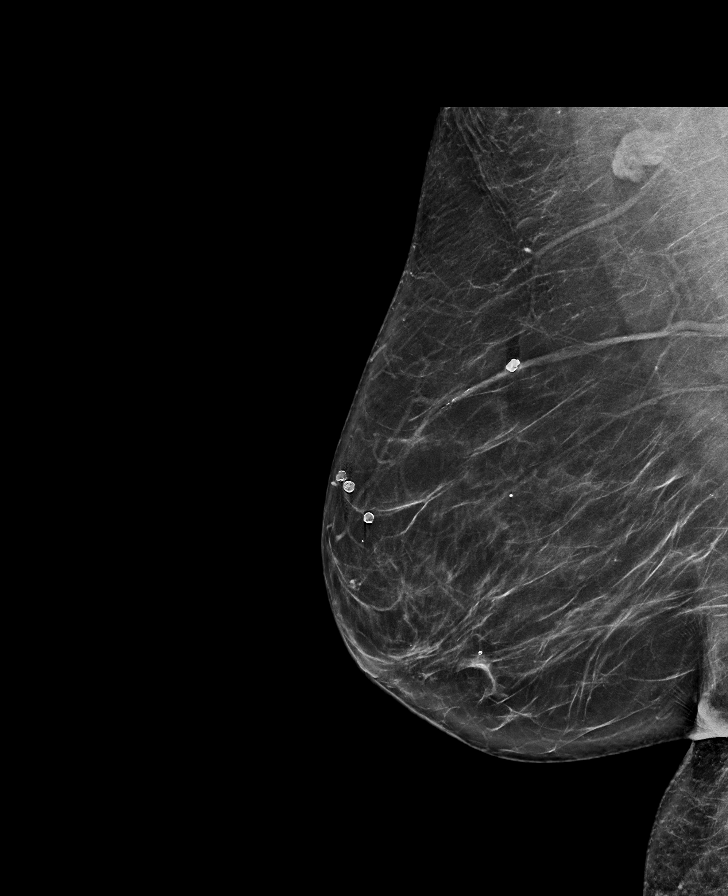

[L CC tomo · tomo slice 35/68.0]
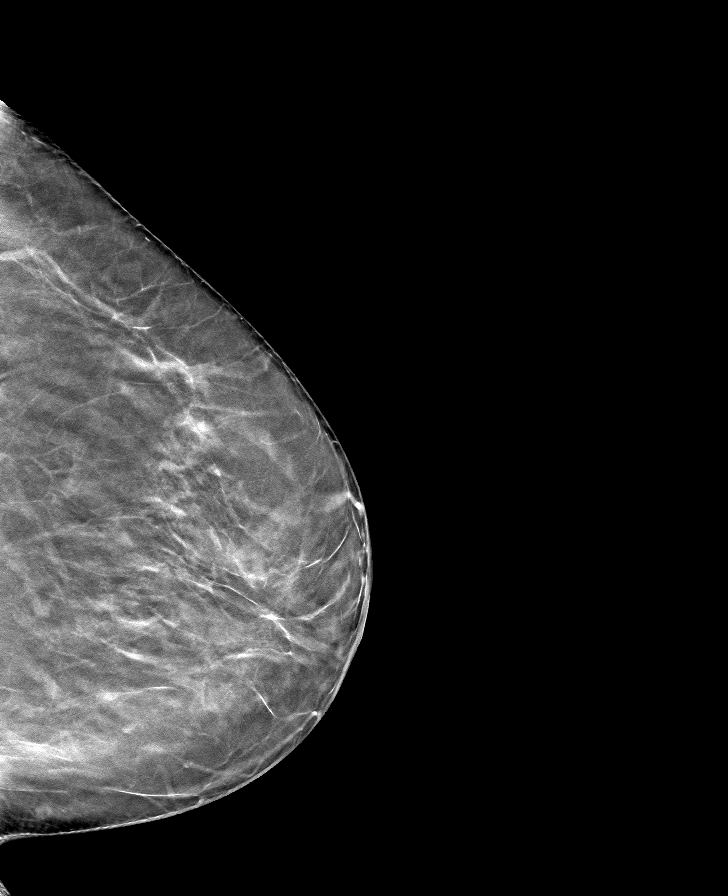

[R MLO tomo · tomo slice 40/79.0]
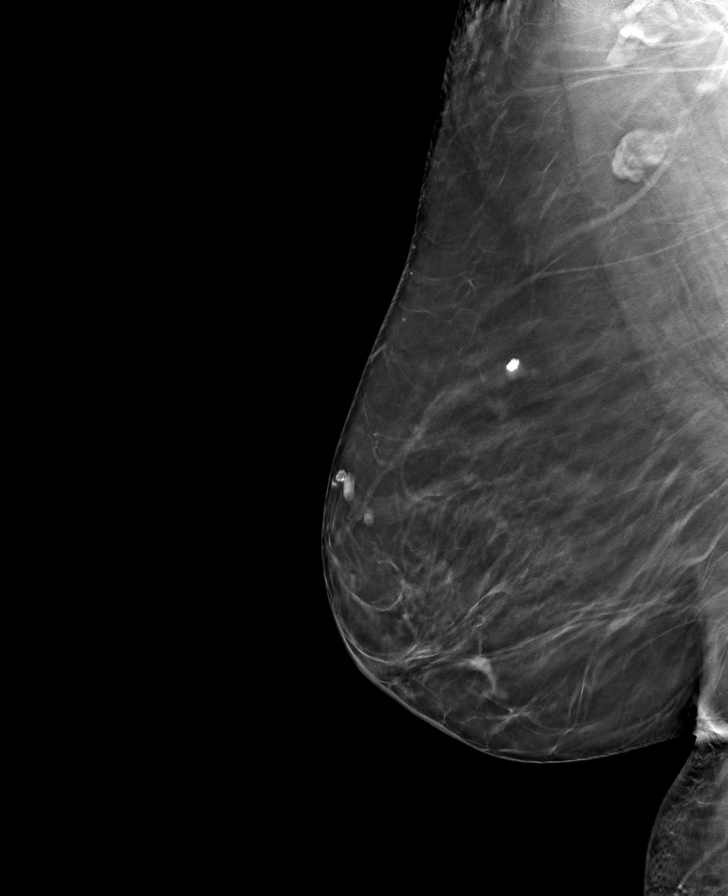

[L MLO tomo · tomo slice 41/82.0]
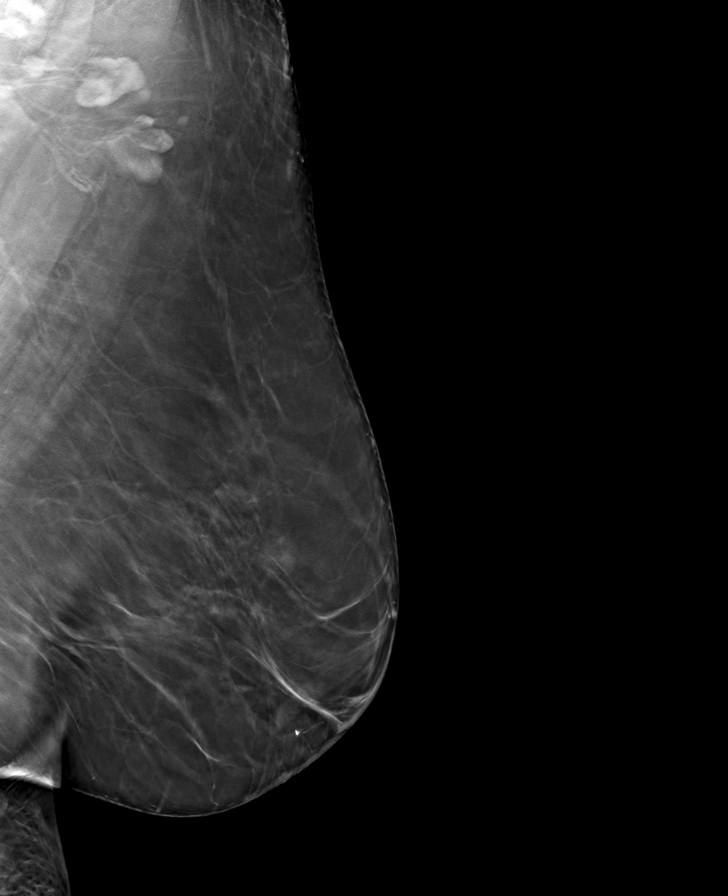

[R CC tomo · tomo slice 31/62.0]
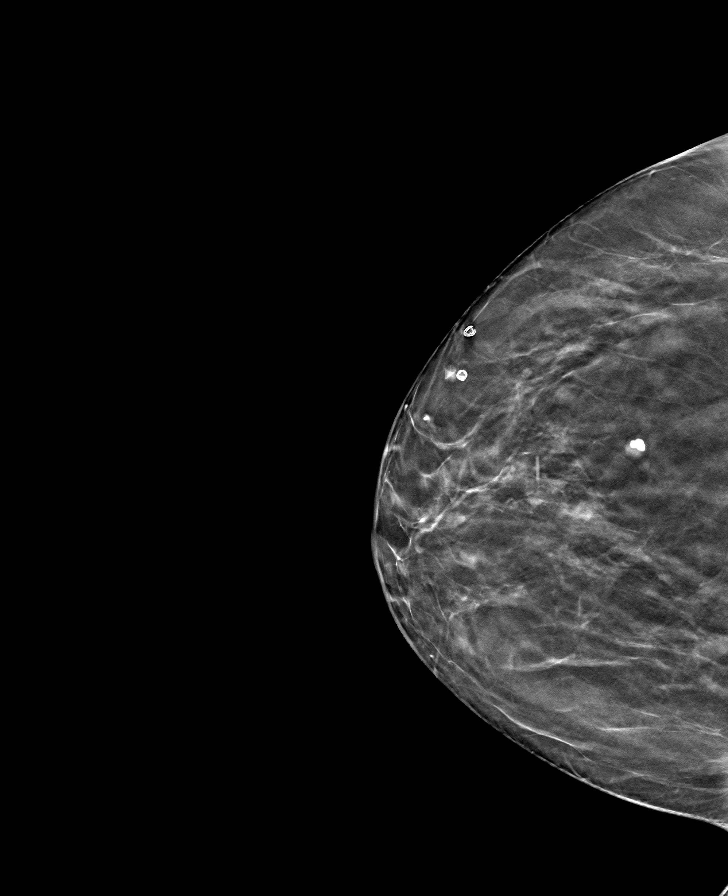

[8 of 24 positions shown; findings below may reference images not displayed]

ACR Breast Density Category b: There are scattered areas of
fibroglandular density.
FINDINGS: There are no findings suspicious for malignancy. Images were
processed with CAD.
IMPRESSION: No mammographic evidence of malignancy. A result letter of this
screening mammogram will be mailed directly to the patient.

RECOMMENDATION:
Screening mammogram in one year. (Code:[TQ])

BI-RADS CATEGORY  1: Negative.

## 2019-06-12 NOTE — Telephone Encounter (Signed)
Pt lost the info for the grant and I provided her with the number. [phone]

## 2019-06-16 ENCOUNTER — Telehealth: Payer: Self-pay | Admitting: Family Medicine

## 2019-06-16 NOTE — Telephone Encounter (Signed)
Pt is calling in stating that she need to speak with Apolonio Schneiders to see if she can get the information about the grant for the prolia b/c she has misplaced the information and is not able to pay over $1000.00 for the injection.

## 2019-06-18 NOTE — Telephone Encounter (Signed)
Attempted to call the patient, but there was a bad connection.  The grant telephone number is   [phone]

## 2019-06-19 NOTE — Telephone Encounter (Signed)
2nd attempt  Bad connection

## 2019-06-23 NOTE — Telephone Encounter (Signed)
Telephone number given to patient.

## 2019-06-29 ENCOUNTER — Other Ambulatory Visit: Payer: Self-pay | Admitting: Family Medicine

## 2019-07-02 ENCOUNTER — Telehealth: Payer: Self-pay | Admitting: Family Medicine

## 2019-07-02 DIAGNOSIS — S31139A Puncture wound of abdominal wall without foreign body, unspecified quadrant without penetration into peritoneal cavity, initial encounter: Secondary | ICD-10-CM | POA: Diagnosis not present

## 2019-07-02 DIAGNOSIS — W57XXXA Bitten or stung by nonvenomous insect and other nonvenomous arthropods, initial encounter: Secondary | ICD-10-CM | POA: Diagnosis not present

## 2019-07-02 NOTE — Telephone Encounter (Signed)
Pt call and stated she can't afford the shot for her bones and need something call in to  [place], [place] Phone:  [phone]  Fax:  [phone]

## 2019-07-03 NOTE — Telephone Encounter (Signed)
Set up follow up to discuss other options.

## 2019-07-03 NOTE — Telephone Encounter (Signed)
Please advise 

## 2019-07-03 NOTE — Telephone Encounter (Signed)
Tried to call pt to set up appt sounded like someone answered ut dead air for a minute

## 2019-07-21 DIAGNOSIS — H26491 Other secondary cataract, right eye: Secondary | ICD-10-CM | POA: Diagnosis not present

## 2019-07-21 DIAGNOSIS — H04123 Dry eye syndrome of bilateral lacrimal glands: Secondary | ICD-10-CM | POA: Diagnosis not present

## 2019-07-28 ENCOUNTER — Encounter (INDEPENDENT_AMBULATORY_CARE_PROVIDER_SITE_OTHER): Payer: Medicare PPO | Admitting: Ophthalmology

## 2019-07-28 ENCOUNTER — Other Ambulatory Visit: Payer: Self-pay

## 2019-07-28 DIAGNOSIS — D3132 Benign neoplasm of left choroid: Secondary | ICD-10-CM

## 2019-07-28 DIAGNOSIS — H35033 Hypertensive retinopathy, bilateral: Secondary | ICD-10-CM | POA: Diagnosis not present

## 2019-07-28 DIAGNOSIS — H353132 Nonexudative age-related macular degeneration, bilateral, intermediate dry stage: Secondary | ICD-10-CM

## 2019-07-28 DIAGNOSIS — I10 Essential (primary) hypertension: Secondary | ICD-10-CM | POA: Diagnosis not present

## 2019-07-28 DIAGNOSIS — H43813 Vitreous degeneration, bilateral: Secondary | ICD-10-CM

## 2019-07-28 DIAGNOSIS — H33302 Unspecified retinal break, left eye: Secondary | ICD-10-CM | POA: Diagnosis not present

## 2019-08-03 ENCOUNTER — Other Ambulatory Visit: Payer: Self-pay | Admitting: Family Medicine

## 2019-08-05 ENCOUNTER — Telehealth: Payer: Self-pay | Admitting: Family Medicine

## 2019-08-05 NOTE — Telephone Encounter (Signed)
Patient would like for Apolonio Schneiders to give her a call back about her Prolia shot. She would also like the numbers or information to get a grant for Prolia.  Please Advise

## 2019-08-05 NOTE — Telephone Encounter (Signed)
Please advise 

## 2019-08-13 ENCOUNTER — Encounter: Payer: Self-pay | Admitting: Family Medicine

## 2019-08-13 NOTE — Telephone Encounter (Signed)
Letter sent via MyChart and mailed to home address with information patient requested.

## 2019-09-04 NOTE — Telephone Encounter (Signed)
Patient is aware of the cost for a Prolia Injection.  Patient is scheduled.  Patient will continue to call for funding.

## 2019-09-09 ENCOUNTER — Ambulatory Visit (INDEPENDENT_AMBULATORY_CARE_PROVIDER_SITE_OTHER): Payer: Medicare PPO | Admitting: *Deleted

## 2019-09-09 ENCOUNTER — Other Ambulatory Visit: Payer: Self-pay

## 2019-09-09 ENCOUNTER — Ambulatory Visit: Payer: Self-pay

## 2019-09-09 DIAGNOSIS — M810 Age-related osteoporosis without current pathological fracture: Secondary | ICD-10-CM

## 2019-09-09 MED ORDER — DENOSUMAB 60 MG/ML ~~LOC~~ SOSY
60.0000 mg | PREFILLED_SYRINGE | Freq: Once | SUBCUTANEOUS | Status: AC
  Administered 2019-09-09: 60 mg via SUBCUTANEOUS

## 2019-09-09 NOTE — Progress Notes (Signed)
Per orders of Dr. Burchette, injection of Prolia given by Aya Geisel S Efrata Brunner. Patient tolerated injection well. 

## 2019-09-09 NOTE — Patient Instructions (Signed)
Health Maintenance Due  Topic Date Due  . Hepatitis C Screening  Never done  . COVID-19 Vaccine (1) Never done  . COLONOSCOPY  03/10/2019    Depression screen [facility] 2/9 02/10/2019 01/07/2017 09/26/2015  Decreased Interest 0 0 0  Down, Depressed, Hopeless 0 0 0  PHQ - 2 Score 0 0 0

## 2019-09-09 NOTE — Telephone Encounter (Signed)
Patient received Prolia 09/09/19.

## 2019-09-22 ENCOUNTER — Other Ambulatory Visit: Payer: Self-pay | Admitting: Family Medicine

## 2019-10-08 ENCOUNTER — Other Ambulatory Visit: Payer: Self-pay | Admitting: Family Medicine

## 2019-10-09 NOTE — Telephone Encounter (Signed)
Refill OK

## 2019-10-09 NOTE — Telephone Encounter (Signed)
Please advise if okay to fill  

## 2019-10-27 DIAGNOSIS — H35033 Hypertensive retinopathy, bilateral: Secondary | ICD-10-CM | POA: Diagnosis not present

## 2019-10-27 DIAGNOSIS — Z961 Presence of intraocular lens: Secondary | ICD-10-CM | POA: Diagnosis not present

## 2019-10-27 DIAGNOSIS — H353112 Nonexudative age-related macular degeneration, right eye, intermediate dry stage: Secondary | ICD-10-CM | POA: Diagnosis not present

## 2019-10-27 DIAGNOSIS — H04123 Dry eye syndrome of bilateral lacrimal glands: Secondary | ICD-10-CM | POA: Diagnosis not present

## 2019-10-27 DIAGNOSIS — H26491 Other secondary cataract, right eye: Secondary | ICD-10-CM | POA: Diagnosis not present

## 2019-11-06 ENCOUNTER — Other Ambulatory Visit: Payer: Self-pay | Admitting: Family Medicine

## 2019-11-24 ENCOUNTER — Other Ambulatory Visit: Payer: Self-pay

## 2019-11-25 ENCOUNTER — Ambulatory Visit: Payer: Medicare PPO | Admitting: Family Medicine

## 2019-11-25 ENCOUNTER — Encounter: Payer: Self-pay | Admitting: Family Medicine

## 2019-11-25 VITALS — BP 120/80 | HR 75 | Temp 97.9°F | Ht 63.0 in | Wt 160.9 lb

## 2019-11-25 DIAGNOSIS — E785 Hyperlipidemia, unspecified: Secondary | ICD-10-CM

## 2019-11-25 DIAGNOSIS — Z23 Encounter for immunization: Secondary | ICD-10-CM

## 2019-11-25 DIAGNOSIS — R21 Rash and other nonspecific skin eruption: Secondary | ICD-10-CM

## 2019-11-25 DIAGNOSIS — R42 Dizziness and giddiness: Secondary | ICD-10-CM

## 2019-11-25 DIAGNOSIS — I10 Essential (primary) hypertension: Secondary | ICD-10-CM | POA: Diagnosis not present

## 2019-11-25 MED ORDER — MOMETASONE FUROATE 0.1 % EX SOLN: CUTANEOUS | 1 refills | Status: DC

## 2019-11-25 NOTE — Progress Notes (Signed)
Established Patient Office Visit  Subjective:  Patient ID: Cassandra Foster, female    DOB: Jun 26, 1940  Age: 79 y.o. MRN: [id number]  CC:  Chief Complaint  Patient presents with  . Dizziness    had a dizzy spell on Monday and had chest pain    HPI Cassandra Foster presents for transient dizziness which occurred this past Monday after lunch.  She states that she had a fairly heavy lunch of hamburger "smothered in onions "and sweet potato [place] fries.  About an hour later she developed dizziness which she described as disequilibrium.  She states this was not quite like classic vertigo.  Her symptoms last about 15 to 20 minutes and then gradually improved.  No orthostasis.  She had not had a lot to drink that morning and thought this may have been some dehydration.  She then later developed some burping and belching and little bit of substernal discomfort.  No classic reflux symptoms.  She stays very active physically has had no exertional chest pain.  No radiation into the neck or arm.  No dyspnea.  She ate some muscidine grapes later in the day and symptoms resolved after that.  She had no symptoms in terms of dizziness or abdominal symptoms since then.  She went to local urgent care the day she had the dizziness but there was a long wait and she ended up leaving.  She has recently had pruritic rash right thoracic area and had some leftover Elocon lotion which helped.  Requesting refill.  He has hypertension treated with amlodipine and losartan HCT.  She is overdue for labs.  She takes pravastatin for hyperlipidemia. Not describing any recent orthostatic symptoms.  Past Medical History:  Diagnosis Date  . Chronic insomnia   . Colon polyps   . Hyperlipidemia   . Hypertension   . Osteoporosis     Past Surgical History:  Procedure Laterality Date  . BREAST EXCISIONAL BIOPSY Left    benign, lump removed many years ago  . [place]  . COLONOSCOPY  multiple  .  KNEE SURGERY  2009   kneecap  . OTHER SURGICAL HISTORY     childbirth x 3  . TUBAL LIGATION     BP dropped     Family History  Problem Relation Age of Onset  . Cancer Mother        breast  . Breast cancer Mother   . Stroke Father   . Colon cancer Brother 30  . Prostate cancer Brother   . Colon cancer Other 52    Social History   Socioeconomic History  . Marital status: Married    Spouse name: Not on file  . Number of children: 3  . Years of education: high school graduate  . Highest education level: High school graduate  Occupational History  . Occupation: retired  Tobacco Use  . Smoking status: Never Smoker  . Smokeless tobacco: Never Used  Vaping Use  . Vaping Use: Never used  Substance and Sexual Activity  . Alcohol use: No  . Drug use: No  . Sexual activity: Not on file  Other Topics Concern  . Not on file  Social History Narrative   Retired   Married   3 sons   5 grand-children   Social Determinants of Radio broadcast assistant Strain:   . Difficulty of Paying Living Expenses: Not on file  Food Insecurity:   . Worried About Running  Out of Food in the Last Year: Not on file  . Ran Out of Food in the Last Year: Not on file  Transportation Needs:   . Lack of Transportation (Medical): Not on file  . Lack of Transportation (Non-Medical): Not on file  Physical Activity:   . Days of Exercise per Week: Not on file  . Minutes of Exercise per Session: Not on file  Stress:   . Feeling of Stress : Not on file  Social Connections:   . Frequency of Communication with Friends and Family: Not on file  . Frequency of Social Gatherings with Friends and Family: Not on file  . Attends Religious Services: Not on file  . Active Member of Clubs or Organizations: Not on file  . Attends Archivist Meetings: Not on file  . Marital Status: Not on file  Intimate Partner Violence:   . Fear of Current or Ex-Partner: Not on file  . Emotionally Abused: Not on  file  . Physically Abused: Not on file  . Sexually Abused: Not on file    Outpatient Medications Prior to Visit  Medication Sig Dispense Refill  . albuterol (PROVENTIL HFA;VENTOLIN HFA) 108 (90 Base) MCG/ACT inhaler Inhale 2 puffs into the lungs every 4 (four) hours as needed for wheezing or shortness of breath. 1 Inhaler 0  . amLODipine (NORVASC) 5 MG tablet TAKE 1 TABLET DAILY 90 tablet 0  . aspirin EC 81 MG tablet 81 mg. Take 3 tabs daily    . Cholecalciferol (VITAMIN D3) 5000 UNITS CAPS Take 1 capsule by mouth daily.    Marland Kitchen denosumab (PROLIA) 60 MG/ML SOSY injection Inject 60 mg into the skin every 6 (six) months.    . diclofenac Sodium (VOLTAREN) 1 % GEL Apply 4 g topically 4 (four) times daily. To affected joint. 100 g 11  . fish oil-omega-3 fatty acids 1000 MG capsule Take 2 g by mouth daily.     Marland Kitchen losartan-hydrochlorothiazide (HYZAAR) 100-12.5 MG tablet TAKE 1 TABLET DAILY 90 tablet 0  . Multiple Vitamins-Minerals (PRESERVISION AREDS 2 PO) Take by mouth daily.    . pravastatin (PRAVACHOL) 20 MG tablet TAKE 1 TABLET DAILY 90 tablet 0  . traZODone (DESYREL) 50 MG tablet TAKE 2 TABLETS AT BEDTIME 180 tablet 0  . mometasone (ELOCON) 0.1 % lotion APPLY TOPICALLY ONCE A DAY AS DIRECTED 60 mL 0  . Risedronate Sodium 35 MG TBEC TAKE 1 TABLET EVERY WEEK (Patient not taking: Reported on 02/10/2019) 12 tablet 0   No facility-administered medications prior to visit.    Allergies  Allergen Reactions  . Simvastatin Other (See Comments)    Body aches    ROS Review of Systems  Constitutional: Negative for appetite change, fatigue, fever and unexpected weight change.  HENT: Negative for trouble swallowing.   Eyes: Negative for visual disturbance.  Respiratory: Negative for cough, chest tightness, shortness of breath and wheezing.   Cardiovascular: Negative for chest pain, palpitations and leg swelling.  Gastrointestinal: Negative for abdominal pain.  Genitourinary: Negative for dysuria.    Neurological: Positive for dizziness. Negative for seizures, syncope, weakness, light-headedness and headaches.       See HPI      Objective:    Physical Exam Constitutional:      Appearance: She is well-developed.  Eyes:     Pupils: Pupils are equal, round, and reactive to light.  Neck:     Thyroid: No thyromegaly.     Vascular: No JVD.  Cardiovascular:  Rate and Rhythm: Normal rate and regular rhythm.     Heart sounds: No gallop.   Pulmonary:     Effort: Pulmonary effort is normal. No respiratory distress.     Breath sounds: Normal breath sounds. No wheezing or rales.  Abdominal:     Palpations: Abdomen is soft.     Tenderness: There is no abdominal tenderness.  Musculoskeletal:     Cervical back: Neck supple.     Right lower leg: No edema.     Left lower leg: No edema.  Skin:    Comments: She has dry scaly patch of rash right mid thoracic area over an area about 2 x 4 cm.  No vesicles.  Neurological:     General: No focal deficit present.     Mental Status: She is alert and oriented to person, place, and time. Mental status is at baseline.     Cranial Nerves: No cranial nerve deficit.     Motor: No weakness.     Coordination: Coordination normal.     Gait: Gait normal.     BP 120/80   Pulse 75   Temp 97.9 F (36.6 C) (Oral)   Ht 5\' 3"  (1.6 m)   Wt 160 lb 14.4 oz (73 kg)   SpO2 96%   BMI 28.50 kg/m  Wt Readings from Last 3 Encounters:  11/25/19 160 lb 14.4 oz (73 kg)  02/10/19 163 lb (73.9 kg)  01/08/19 156 lb 12.8 oz (71.1 kg)     Health Maintenance Due  Topic Date Due  . Hepatitis C Screening  Never done  . COVID-19 Vaccine (1) Never done  . COLONOSCOPY  03/10/2019  . INFLUENZA VACCINE  09/27/2019    There are no preventive care reminders to display for this patient.  Lab Results  Component Value Date   TSH 1.34 01/15/2017   Lab Results  Component Value Date   WBC 8.1 01/15/2017   HGB 13.6 01/15/2017   HCT 40.2 01/15/2017   MCV 93.5  01/15/2017   PLT 175.0 01/15/2017   Lab Results  Component Value Date   NA 136 01/17/2018   K 3.7 01/17/2018   CO2 25 01/17/2018   GLUCOSE 104 (H) 01/17/2018   BUN 14 01/17/2018   CREATININE 0.61 01/17/2018   BILITOT 0.6 01/17/2018   ALKPHOS 86 01/17/2018   AST 25 01/17/2018   ALT 31 01/17/2018   PROT 7.2 01/17/2018   ALBUMIN 4.7 01/17/2018   CALCIUM 9.6 01/17/2018   GFR 100.91 01/17/2018   Lab Results  Component Value Date   CHOL 157 01/17/2018   Lab Results  Component Value Date   HDL 57.10 01/17/2018   Lab Results  Component Value Date   LDLCALC 78 01/17/2018   Lab Results  Component Value Date   TRIG 107.0 01/17/2018   Lab Results  Component Value Date   CHOLHDL 3 01/17/2018   No results found for: HGBA1C    Assessment & Plan:   #1 episode of recent transient dizziness.  This sounded more like disequilibrium.  She denies any red flags such as speech change, swallowing difficulties, focal weakness, ataxia, etc.  Symptoms lasted about 15 to 20 minutes then resolved.  Nonfocal neuro exam  -Observe for now and follow-up if recurs  #2 hypertension stable. -Continue losartan HCTZ 100/12.5 mg 1 daily and amlodipine 5 mg daily -Check basic metabolic panel  #3 dyslipidemia.  Treated with pravastatin 20 mg daily -Check lipid and hepatic panel  #4 pruritic eczematous  rash right thoracic area -Refill Elocon lotion to use once daily as needed  #5 health maintenance -High-dose flu vaccine given  Meds ordered this encounter  Medications  . mometasone (ELOCON) 0.1 % lotion    Sig: APPLY TOPICALLY ONCE A DAY AS DIRECTED    Dispense:  60 mL    Refill:  1    Follow-up: No follow-ups on file.    Carolann Littler, MD

## 2019-11-25 NOTE — Patient Instructions (Signed)
Vertigo Vertigo is the feeling that you or your surroundings are moving when they are not. This feeling can come and go at any time. Vertigo often goes away on its own. Vertigo can be dangerous if it occurs while you are doing something that could endanger you or others, such as driving or operating machinery. Your health care provider will do tests to determine the cause of your vertigo. Tests will also help your health care provider decide how best to treat your condition. Follow these instructions at home: Eating and drinking      Drink enough fluid to keep your urine pale yellow.  Do not drink alcohol. Activity  Return to your normal activities as told by your health care provider. Ask your health care provider what activities are safe for you.  In the morning, first sit up on the side of the bed. When you feel okay, stand slowly while you hold onto something until you know that your balance is fine.  Move slowly. Avoid sudden body or head movements or certain positions, as told by your health care provider.  If you have trouble walking or keeping your balance, try using a cane for stability. If you feel dizzy or unstable, sit down right away.  Avoid doing any tasks that would cause danger to you or others if vertigo occurs.  Avoid bending down if you feel dizzy. Place items in your home so that they are easy for you to reach without leaning over.  Do not drive or use heavy machinery if you feel dizzy. General instructions  Take over-the-counter and prescription medicines only as told by your health care provider.  Keep all follow-up visits as told by your health care provider. This is important. Contact a health care provider if:  Your medicines do not relieve your vertigo or they make it worse.  You have a fever.  Your condition gets worse or you develop new symptoms.  Your family or friends notice any behavioral changes.  Your nausea or vomiting gets worse.  You  have numbness or a prickling and tingling sensation in part of your body. Get help right away if you:  Have difficulty moving or speaking.  Are always dizzy.  Faint.  Develop severe headaches.  Have weakness in your hands, arms, or legs.  Have changes in your hearing or vision.  Develop a stiff neck.  Develop sensitivity to light. Summary  Vertigo is the feeling that you or your surroundings are moving when they are not.  Your health care provider will do tests to determine the cause of your vertigo.  Follow instructions for home care. You may be told to avoid certain tasks, positions, or movements.  Contact a health care provider if your medicines do not relieve your symptoms, or if you have a fever, nausea, vomiting, or changes in behavior.  Get help right away if you have severe headaches or difficulty speaking, or you develop hearing or vision problems. This information is not intended to replace advice given to you by your health care provider. Make sure you discuss any questions you have with your health care provider. Document Revised: 01/06/2018 Document Reviewed: 01/06/2018 Elsevier Patient Education  [place] [employer].  GYN- Dr Dellis Filbert  [phone]

## 2019-11-26 LAB — LIPID PANEL
Cholesterol: 152 mg/dL (ref <200)
HDL: 57 mg/dL (ref >=50)
LDL Cholesterol (Calc): 72 mg/dL (calc)
Non-HDL Cholesterol (Calc): 95 mg/dL (calc) (ref <130)
Total CHOL/HDL Ratio: 2.7 (calc) (ref <5.0)
Triglycerides: 155 mg/dL — ABNORMAL HIGH (ref <150)

## 2019-11-26 LAB — BASIC METABOLIC PANEL
BUN: 16 mg/dL (ref 7–25)
CO2: 29 mmol/L (ref 20–32)
Calcium: 10.2 mg/dL (ref 8.6–10.4)
Chloride: 97 mmol/L — ABNORMAL LOW (ref 98–110)
Creat: 0.7 mg/dL (ref 0.60–0.93)
Glucose, Bld: 105 mg/dL — ABNORMAL HIGH (ref 65–99)
Potassium: 4.7 mmol/L (ref 3.5–5.3)
Sodium: 132 mmol/L — ABNORMAL LOW (ref 135–146)

## 2019-11-26 LAB — HEPATIC FUNCTION PANEL
AG Ratio: 1.9 (calc) (ref 1.0–2.5)
ALT: 29 U/L (ref 6–29)
AST: 25 U/L (ref 10–35)
Albumin: 4.7 g/dL (ref 3.6–5.1)
Alkaline phosphatase (APISO): 81 U/L (ref 37–153)
Bilirubin, Direct: 0.1 mg/dL (ref 0.0–0.2)
Globulin: 2.5 g/dL (calc) (ref 1.9–3.7)
Indirect Bilirubin: 0.5 mg/dL (calc) (ref 0.2–1.2)
Total Bilirubin: 0.6 mg/dL (ref 0.2–1.2)
Total Protein: 7.2 g/dL (ref 6.1–8.1)

## 2019-11-30 ENCOUNTER — Telehealth: Payer: Self-pay | Admitting: Family Medicine

## 2019-11-30 NOTE — Telephone Encounter (Signed)
BB-I spoke to patient about lab results/she expressed concerns about not being able to afford the Prolia injection at $1,000 every 6 months/has tried to call the number that was given to her about government help but it rings and no one ever answers/is asking if there is any assistance out there for her for this and if there is not then she would need something that she could afford/plz advise/thx dmf

## 2019-11-30 NOTE — Telephone Encounter (Signed)
She has hx of GERD.  My concern with trying bisphosphonate is that it would likely exacerbate her GERD.

## 2019-12-01 NOTE — Telephone Encounter (Signed)
I mailed a patient assistance form for pt to fill out/she agrees to mail it back to me when she has completed her portion/we are hoping to get assistance with her $1,000 cost per injection/thx dmf

## 2019-12-11 ENCOUNTER — Telehealth: Payer: Self-pay | Admitting: Family Medicine

## 2019-12-11 NOTE — Telephone Encounter (Signed)
Any recommendations?

## 2019-12-11 NOTE — Telephone Encounter (Signed)
Patient is wanting 2-3 OBGYN doctors to choose from in [place].  She was going to get the names before she left last time and she forgot.  Please give patient a call as soon as possible.    *Pt [place] scared of the [place] the country so she prefers not to go to [place] or [place], only [place].

## 2019-12-13 NOTE — Telephone Encounter (Signed)
She could try [facility]- Dr Aloha Gell [phone]  or [facility]- Dr Philis Pique  [phone]

## 2019-12-14 NOTE — Telephone Encounter (Signed)
Patient informed of the message below.

## 2019-12-19 ENCOUNTER — Other Ambulatory Visit: Payer: Self-pay | Admitting: Family Medicine

## 2020-01-04 DIAGNOSIS — M9904 Segmental and somatic dysfunction of sacral region: Secondary | ICD-10-CM | POA: Diagnosis not present

## 2020-01-04 DIAGNOSIS — M5137 Other intervertebral disc degeneration, lumbosacral region: Secondary | ICD-10-CM | POA: Diagnosis not present

## 2020-01-04 DIAGNOSIS — M9905 Segmental and somatic dysfunction of pelvic region: Secondary | ICD-10-CM | POA: Diagnosis not present

## 2020-01-04 DIAGNOSIS — M9903 Segmental and somatic dysfunction of lumbar region: Secondary | ICD-10-CM | POA: Diagnosis not present

## 2020-01-06 DIAGNOSIS — M9903 Segmental and somatic dysfunction of lumbar region: Secondary | ICD-10-CM | POA: Diagnosis not present

## 2020-01-06 DIAGNOSIS — M9904 Segmental and somatic dysfunction of sacral region: Secondary | ICD-10-CM | POA: Diagnosis not present

## 2020-01-06 DIAGNOSIS — M5137 Other intervertebral disc degeneration, lumbosacral region: Secondary | ICD-10-CM | POA: Diagnosis not present

## 2020-01-06 DIAGNOSIS — M9905 Segmental and somatic dysfunction of pelvic region: Secondary | ICD-10-CM | POA: Diagnosis not present

## 2020-02-04 ENCOUNTER — Other Ambulatory Visit: Payer: Self-pay | Admitting: Family Medicine

## 2020-02-15 ENCOUNTER — Telehealth: Payer: Self-pay | Admitting: Family Medicine

## 2020-02-15 NOTE — Telephone Encounter (Signed)
Tried to call patient to  schedule Medicare Annual Wellness Visit (AWV) either virtually or in office.  No answer  Last AWV 02/10/19  please schedule at anytime with [facility] Nurse Health Advisor 1 or 2   This should be a 45 minute visit.

## 2020-02-29 DIAGNOSIS — J40 Bronchitis, not specified as acute or chronic: Secondary | ICD-10-CM | POA: Diagnosis not present

## 2020-02-29 DIAGNOSIS — R059 Cough, unspecified: Secondary | ICD-10-CM | POA: Diagnosis not present

## 2020-03-15 ENCOUNTER — Other Ambulatory Visit: Payer: Self-pay | Admitting: Family Medicine

## 2020-03-29 ENCOUNTER — Telehealth: Payer: Self-pay | Admitting: Family Medicine

## 2020-03-29 NOTE — Telephone Encounter (Signed)
Patient would like to speak to [facility] about Prolia. Please call [phone]

## 2020-04-11 ENCOUNTER — Ambulatory Visit (INDEPENDENT_AMBULATORY_CARE_PROVIDER_SITE_OTHER): Payer: Medicare PPO

## 2020-04-11 ENCOUNTER — Other Ambulatory Visit: Payer: Self-pay

## 2020-04-11 ENCOUNTER — Ambulatory Visit: Payer: Self-pay

## 2020-04-11 DIAGNOSIS — M810 Age-related osteoporosis without current pathological fracture: Secondary | ICD-10-CM | POA: Diagnosis not present

## 2020-04-11 MED ORDER — DENOSUMAB 60 MG/ML ~~LOC~~ SOSY
60.0000 mg | PREFILLED_SYRINGE | Freq: Once | SUBCUTANEOUS | Status: AC
  Administered 2020-04-11: 60 mg via SUBCUTANEOUS

## 2020-04-11 NOTE — Progress Notes (Signed)
Patient seen today for prolia injection.  Tolerated well.

## 2020-04-12 NOTE — Telephone Encounter (Signed)
Patient received Prolia injection 04/11/20.

## 2020-04-29 ENCOUNTER — Telehealth: Payer: Self-pay | Admitting: Family Medicine

## 2020-04-29 NOTE — Telephone Encounter (Signed)
Left message for patient to call back and schedule Medicare Annual Wellness Visit (AWV) either virtually or in office. No detailed message   Last AWV 02/10/2019 please schedule at anytime with [facility] Nurse Health Advisor 1 or 2   This should be a 45 minute visit.

## 2020-05-02 ENCOUNTER — Other Ambulatory Visit: Payer: Self-pay | Admitting: Family Medicine

## 2020-05-16 ENCOUNTER — Telehealth: Payer: Self-pay | Admitting: Family Medicine

## 2020-05-16 ENCOUNTER — Other Ambulatory Visit: Payer: Self-pay | Admitting: Family Medicine

## 2020-05-16 MED ORDER — TRAZODONE HCL 50 MG PO TABS: 100.0000 mg | ORAL_TABLET | Freq: Every day | ORAL | 1 refills | Status: DC

## 2020-05-16 NOTE — Telephone Encounter (Signed)
Stew from [facility] is calling regarding a refill on the patient's Trazodone.  He states he received a message back from [place] saying the medication had been refilled but he states they have not received anything.  Please advise.

## 2020-05-16 NOTE — Telephone Encounter (Signed)
Rx resent.

## 2020-05-31 ENCOUNTER — Other Ambulatory Visit: Payer: Self-pay

## 2020-06-01 ENCOUNTER — Encounter: Payer: Self-pay | Admitting: Family Medicine

## 2020-06-01 ENCOUNTER — Ambulatory Visit: Payer: Medicare PPO | Admitting: Family Medicine

## 2020-06-01 VITALS — BP 138/62 | HR 80 | Temp 98.0°F | Wt 160.1 lb

## 2020-06-01 DIAGNOSIS — M1712 Unilateral primary osteoarthritis, left knee: Secondary | ICD-10-CM | POA: Diagnosis not present

## 2020-06-01 NOTE — Patient Instructions (Signed)
Osteoarthritis  Osteoarthritis is a type of arthritis. It refers to joint pain or joint disease. Osteoarthritis affects tissue that covers the ends of bones in joints (cartilage). Cartilage acts as a cushion between the bones and helps them move smoothly. Osteoarthritis occurs when cartilage in the joints gets worn down. Osteoarthritis is sometimes called "wear and tear" arthritis. Osteoarthritis is the most common form of arthritis. It often occurs in older people. It is a condition that gets worse over time. The joints most often affected by this condition are in the fingers, toes, hips, knees, and spine, including the neck and lower back. What are the causes? This condition is caused by the wearing down of cartilage that covers the ends of bones. What increases the risk? The following factors may make you more likely to develop this condition:  Being age 50 or older.  Obesity.  Overuse of joints.  Past injury of a joint.  Past surgery on a joint.  Family history of osteoarthritis. What are the signs or symptoms? The main symptoms of this condition are pain, swelling, and stiffness in the joint. Other symptoms may include:  An enlarged joint.  More pain and further damage caused by small pieces of bone or cartilage that break off and float inside of the joint.  Small deposits of bone (osteophytes) that grow on the edges of the joint.  A grating or scraping feeling inside the joint when you move it.  Popping or creaking sounds when you move.  Difficulty walking or exercising.  An inability to grip items, twist your hand(s), or control the movements of your hands and fingers. How is this diagnosed? This condition may be diagnosed based on:  Your medical history.  A physical exam.  Your symptoms.  X-rays of the affected joint(s).  Blood tests to rule out other types of arthritis. How is this treated? There is no cure for this condition, but treatment can help control  pain and improve joint function. Treatment may include a combination of therapies, such as:  Pain relief techniques, such as: ? Applying heat and cold to the joint. ? Massage. ? A form of talk therapy called cognitive behavioral therapy (CBT). This therapy helps you set goals and follow up on the changes that you make.  Medicines for pain and inflammation. The medicines can be taken by mouth or applied to the skin. They include: ? NSAIDs, such as ibuprofen. ? Prescription medicines. ? Strong anti-inflammatory medicines (corticosteroids). ? Certain nutritional supplements.  A prescribed exercise program. You may work with a physical therapist.  Assistive devices, such as a brace, wrap, splint, specialized glove, or cane.  A weight control plan.  Surgery, such as: ? An osteotomy. This is done to reposition the bones and relieve pain or to remove loose pieces of bone and cartilage. ? Joint replacement surgery. You may need this surgery if you have advanced osteoarthritis. Follow these instructions at home: Activity  Rest your affected joints as told by your health care provider.  Exercise as told by your health care provider. He or she may recommend specific types of exercise, such as: ? Strengthening exercises. These are done to strengthen the muscles that support joints affected by arthritis. ? Aerobic activities. These are exercises, such as brisk walking or water aerobics, that increase your heart rate. ? Range-of-motion activities. These help your joints move more easily. ? Balance and agility exercises. Managing pain, stiffness, and swelling  If directed, apply heat to the affected area as often   as told by your health care provider. Use the heat source that your health care provider recommends, such as a moist heat pack or a heating pad. ? If you have a removable assistive device, remove it as told by your health care provider. ? Place a towel between your skin and the heat  source. If your health care provider tells you to keep the assistive device on while you apply heat, place a towel between the assistive device and the heat source. ? Leave the heat on for 20-30 minutes. ? Remove the heat if your skin turns bright red. This is especially important if you are unable to feel pain, heat, or cold. You may have a greater risk of getting burned.  If directed, put ice on the affected area. To do this: ? If you have a removable assistive device, remove it as told by your health care provider. ? Put ice in a plastic bag. ? Place a towel between your skin and the bag. If your health care provider tells you to keep the assistive device on during icing, place a towel between the assistive device and the bag. ? Leave the ice on for 20 minutes, 2-3 times a day. ? Move your fingers or toes often to reduce stiffness and swelling. ? Raise (elevate) the injured area above the level of your heart while you are sitting or lying down.      General instructions  Take over-the-counter and prescription medicines only as told by your health care provider.  Maintain a healthy weight. Follow instructions from your health care provider for weight control.  Do not use any products that contain nicotine or tobacco, such as cigarettes, e-cigarettes, and chewing tobacco. If you need help quitting, ask your health care provider.  Use assistive devices as told by your health care provider.  Keep all follow-up visits as told by your health care provider. This is important. Where to find more information  [employer] of Arthritis and Musculoskeletal and Skin Diseases: www.niams.SouthExposed.es  [employer] on Aging: http://kim-miller.com/  American College of Rheumatology: www.rheumatology.org Contact a health care provider if:  You have redness, swelling, or a feeling of warmth in a joint that gets worse.  You have a fever along with joint or muscle aches.  You develop a  rash.  You have trouble doing your normal activities. Get help right away if:  You have pain that gets worse and is not relieved by pain medicine. Summary  Osteoarthritis is a type of arthritis that affects tissue covering the ends of bones in joints (cartilage).  This condition is caused by the wearing down of cartilage that covers the ends of bones.  The main symptom of this condition is pain, swelling, and stiffness in the joint.  There is no cure for this condition, but treatment can help control pain and improve joint function. This information is not intended to replace advice given to you by your health care provider. Make sure you discuss any questions you have with your health care provider. Document Revised: 02/09/2019 Document Reviewed: 02/09/2019 Elsevier Patient Education  2021 [place].  Do knee extension exercises as discussed- could use strap on ankle weights for resistance  Consider cycling with recumbent bike   Can try OTC Diclofenac gel.

## 2020-06-01 NOTE — Progress Notes (Signed)
Established Patient Office Visit  Subjective:  Patient ID: Cassandra Foster, female    DOB: 1941/01/11  Age: 80 y.o. MRN: [id number]  CC:  Chief Complaint  Patient presents with  . Knee Pain    L knee, broke the knee cap in the past, pt states there is a lot of pressure in her knee and feels like it is going to drop.    HPI Cassandra Foster presents for left knee pain.  Denies any recent injury.  Around 2009 she had surgery on her patella.  She has seen sports medicine in the past.  We reviewed plain films of the left knee from back in 2020 which showed degenerative changes medial compartment greater than lateral.  She describes diffuse knee pain.  Sometimes feels like there is "pressure "in the knee.  No effusion.  No warmth.  No erythema.  No locking or giving way.  Pain worse with going down hills.  No definite weakness.  She has not taken any medications for this.  She does have some topical diclofenac which she has used infrequently  Past Medical History:  Diagnosis Date  . Chronic insomnia   . Colon polyps   . Hyperlipidemia   . Hypertension   . Osteoporosis     Past Surgical History:  Procedure Laterality Date  . BREAST EXCISIONAL BIOPSY Left    benign, lump removed many years ago  . [place]  . COLONOSCOPY  multiple  . KNEE SURGERY  2009   kneecap  . OTHER SURGICAL HISTORY     childbirth x 3  . TUBAL LIGATION     BP dropped     Family History  Problem Relation Age of Onset  . Cancer Mother        breast  . Breast cancer Mother   . Stroke Father   . Colon cancer Brother 63  . Prostate cancer Brother   . Colon cancer Other 50    Social History   Socioeconomic History  . Marital status: Married    Spouse name: Not on file  . Number of children: 3  . Years of education: high school graduate  . Highest education level: High school graduate  Occupational History  . Occupation: retired  Tobacco Use  . Smoking status: Never Smoker   . Smokeless tobacco: Never Used  Vaping Use  . Vaping Use: Never used  Substance and Sexual Activity  . Alcohol use: No  . Drug use: No  . Sexual activity: Not on file  Other Topics Concern  . Not on file  Social History Narrative   Retired   Married   3 sons   5 grand-children   Social Determinants of Radio broadcast assistant Strain: Not on file  Food Insecurity: Not on file  Transportation Needs: Not on file  Physical Activity: Not on file  Stress: Not on file  Social Connections: Not on file  Intimate Partner Violence: Not on file    Outpatient Medications Prior to Visit  Medication Sig Dispense Refill  . albuterol (PROVENTIL HFA;VENTOLIN HFA) 108 (90 Base) MCG/ACT inhaler Inhale 2 puffs into the lungs every 4 (four) hours as needed for wheezing or shortness of breath. 1 Inhaler 0  . amLODipine (NORVASC) 5 MG tablet TAKE 1 TABLET DAILY 90 tablet 0  . aspirin EC 81 MG tablet 81 mg. Take 3 tabs daily    . Cholecalciferol (VITAMIN D3) 5000 UNITS CAPS Take  1 capsule by mouth daily.    Marland Kitchen denosumab (PROLIA) 60 MG/ML SOSY injection Inject 60 mg into the skin every 6 (six) months.    . diclofenac Sodium (VOLTAREN) 1 % GEL Apply 4 g topically 4 (four) times daily. To affected joint. 100 g 11  . fish oil-omega-3 fatty acids 1000 MG capsule Take 2 g by mouth daily.     Marland Kitchen losartan-hydrochlorothiazide (HYZAAR) 100-12.5 MG tablet TAKE 1 TABLET DAILY 90 tablet 0  . mometasone (ELOCON) 0.1 % lotion APPLY TOPICALLY ONCE A DAY AS DIRECTED 60 mL 1  . Multiple Vitamins-Minerals (PRESERVISION AREDS 2 PO) Take by mouth daily.    . pravastatin (PRAVACHOL) 20 MG tablet TAKE 1 TABLET DAILY 90 tablet 0  . Risedronate Sodium 35 MG TBEC TAKE 1 TABLET EVERY WEEK (Patient not taking: Reported on 02/10/2019) 12 tablet 0  . traZODone (DESYREL) 50 MG tablet Take 2 tablets (100 mg total) by mouth at bedtime. 180 tablet 1   No facility-administered medications prior to visit.    Allergies   Allergen Reactions  . Simvastatin Other (See Comments)    Body aches    ROS Review of Systems  Constitutional: Negative for chills and fever.  Respiratory: Negative for shortness of breath.   Cardiovascular: Negative for chest pain.  Musculoskeletal: Positive for back pain.  Neurological: Negative for weakness.      Objective:    Physical Exam Vitals reviewed.  Cardiovascular:     Rate and Rhythm: Normal rate and regular rhythm.  Pulmonary:     Effort: Pulmonary effort is normal.     Breath sounds: Normal breath sounds.  Musculoskeletal:     Comments: Left knee reveals scar from previous surgery.  No effusion.  No warmth.  No erythema.  Good range of motion.  Minimal medial joint line tenderness.  Neurological:     Mental Status: She is alert.     BP 138/62 (BP Location: Left Arm, Patient Position: Sitting, Cuff Size: Normal)   Pulse 80   Temp 98 F (36.7 C) (Oral)   Wt 160 lb 1.6 oz (72.6 kg)   SpO2 98%   BMI 28.36 kg/m  Wt Readings from Last 3 Encounters:  06/01/20 160 lb 1.6 oz (72.6 kg)  11/25/19 160 lb 14.4 oz (73 kg)  02/10/19 163 lb (73.9 kg)     Health Maintenance Due  Topic Date Due  . COLONOSCOPY (Pts 45-69yrs Insurance coverage will need to be confirmed)  03/10/2019  . COVID-19 Vaccine (2 - Moderna 3-dose series) 04/12/2020    There are no preventive care reminders to display for this patient.  Lab Results  Component Value Date   TSH 1.34 01/15/2017   Lab Results  Component Value Date   WBC 8.1 01/15/2017   HGB 13.6 01/15/2017   HCT 40.2 01/15/2017   MCV 93.5 01/15/2017   PLT 175.0 01/15/2017   Lab Results  Component Value Date   NA 132 (L) 11/25/2019   K 4.7 11/25/2019   CO2 29 11/25/2019   GLUCOSE 105 (H) 11/25/2019   BUN 16 11/25/2019   CREATININE 0.70 11/25/2019   BILITOT 0.6 11/25/2019   ALKPHOS 86 01/17/2018   AST 25 11/25/2019   ALT 29 11/25/2019   PROT 7.2 11/25/2019   ALBUMIN 4.7 01/17/2018   CALCIUM 10.2 11/25/2019    GFR 100.91 01/17/2018   Lab Results  Component Value Date   CHOL 152 11/25/2019   Lab Results  Component Value Date   HDL 57  11/25/2019   Lab Results  Component Value Date   LDLCALC 72 11/25/2019   Lab Results  Component Value Date   TRIG 155 (H) 11/25/2019   Lab Results  Component Value Date   CHOLHDL 2.7 11/25/2019   No results found for: HGBA1C    Assessment & Plan:   Left knee pain.  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  No signs of effusion.  -Avoid regular use of non-steroidals -We discussed the importance of exercise being the mainstay of treatment especially recommend either cycling and/or the extensions with weights -Continue diclofenac topically as needed -Consider icing after activities -We did discuss possible options such as corticosteroid injection at this point she declines   No orders of the defined types were placed in this encounter.   Follow-up: No follow-ups on file.    Carolann Littler, MD

## 2020-06-10 ENCOUNTER — Other Ambulatory Visit: Payer: Self-pay | Admitting: Family Medicine

## 2020-07-15 ENCOUNTER — Ambulatory Visit: Payer: Medicare PPO | Admitting: Family Medicine

## 2020-07-15 ENCOUNTER — Encounter: Payer: Self-pay | Admitting: Family Medicine

## 2020-07-15 ENCOUNTER — Other Ambulatory Visit: Payer: Self-pay

## 2020-07-15 VITALS — BP 130/68 | HR 84 | Temp 98.0°F | Ht 63.0 in | Wt 159.0 lb

## 2020-07-15 DIAGNOSIS — M1711 Unilateral primary osteoarthritis, right knee: Secondary | ICD-10-CM

## 2020-07-15 NOTE — Progress Notes (Signed)
Established Patient Office Visit  Subjective:  Patient ID: Cassandra Foster, female    DOB: 01-Dec-1940  Age: 80 y.o. MRN: [id number]  CC:  Chief Complaint  Patient presents with  . Joint Swelling    HPI KECHIA YAHNKE presents for recent increased right knee pain.  Denies specific injury.  After doing a lot of potential overuse activities she had difficulty walking 1 day last week.  Symptoms improved this week.  No visible swelling.  She has had prior history of surgery left knee.  No locking or giving way of the knee.  Has seen sports medicine previously.  She had x-rays of both knees 2020 which shows some degenerative changes mostly medial compartment.  She initially set up this appointment thinking she might need some sort of injection but symptoms are improved some today.  Does have frequent early morning stiffness in her joints  Past Medical History:  Diagnosis Date  . Chronic insomnia   . Colon polyps   . Hyperlipidemia   . Hypertension   . Osteoporosis     Past Surgical History:  Procedure Laterality Date  . BREAST EXCISIONAL BIOPSY Left    benign, lump removed many years ago  . [place]  . COLONOSCOPY  multiple  . KNEE SURGERY  2009   kneecap  . OTHER SURGICAL HISTORY     childbirth x 3  . TUBAL LIGATION     BP dropped     Family History  Problem Relation Age of Onset  . Cancer Mother        breast  . Breast cancer Mother   . Stroke Father   . Colon cancer Brother 52  . Prostate cancer Brother   . Colon cancer Other 59    Social History   Socioeconomic History  . Marital status: Married    Spouse name: Not on file  . Number of children: 3  . Years of education: high school graduate  . Highest education level: High school graduate  Occupational History  . Occupation: retired  Tobacco Use  . Smoking status: Never Smoker  . Smokeless tobacco: Never Used  Vaping Use  . Vaping Use: Never used  Substance and Sexual Activity   . Alcohol use: No  . Drug use: No  . Sexual activity: Not on file  Other Topics Concern  . Not on file  Social History Narrative   Retired   Married   3 sons   5 grand-children   Social Determinants of Radio broadcast assistant Strain: Not on file  Food Insecurity: Not on file  Transportation Needs: Not on file  Physical Activity: Not on file  Stress: Not on file  Social Connections: Not on file  Intimate Partner Violence: Not on file    Outpatient Medications Prior to Visit  Medication Sig Dispense Refill  . albuterol (PROVENTIL HFA;VENTOLIN HFA) 108 (90 Base) MCG/ACT inhaler Inhale 2 puffs into the lungs every 4 (four) hours as needed for wheezing or shortness of breath. 1 Inhaler 0  . amLODipine (NORVASC) 5 MG tablet TAKE 1 TABLET DAILY 90 tablet 0  . aspirin EC 81 MG tablet 81 mg. Take 3 tabs daily    . Cholecalciferol (VITAMIN D3) 5000 UNITS CAPS Take 1 capsule by mouth daily.    Marland Kitchen denosumab (PROLIA) 60 MG/ML SOSY injection Inject 60 mg into the skin every 6 (six) months.    . diclofenac Sodium (VOLTAREN) 1 % GEL  Apply 4 g topically 4 (four) times daily. To affected joint. 100 g 11  . fish oil-omega-3 fatty acids 1000 MG capsule Take 2 g by mouth daily.    Marland Kitchen losartan-hydrochlorothiazide (HYZAAR) 100-12.5 MG tablet TAKE 1 TABLET DAILY 90 tablet 0  . mometasone (ELOCON) 0.1 % lotion APPLY TOPICALLY ONCE A DAY AS DIRECTED 60 mL 1  . Multiple Vitamins-Minerals (PRESERVISION AREDS 2 PO) Take by mouth daily.    . pravastatin (PRAVACHOL) 20 MG tablet TAKE 1 TABLET DAILY 90 tablet 0  . traZODone (DESYREL) 50 MG tablet Take 2 tablets (100 mg total) by mouth at bedtime. 180 tablet 1  . Risedronate Sodium 35 MG TBEC TAKE 1 TABLET EVERY WEEK (Patient not taking: Reported on 02/10/2019) 12 tablet 0   No facility-administered medications prior to visit.    Allergies  Allergen Reactions  . Simvastatin Other (See Comments)    Body aches    ROS Review of Systems   Musculoskeletal: Positive for arthralgias.  Neurological: Negative for weakness and numbness.      Objective:    Physical Exam Vitals reviewed.  Constitutional:      Appearance: Normal appearance.  Cardiovascular:     Rate and Rhythm: Normal rate and regular rhythm.  Pulmonary:     Effort: Pulmonary effort is normal.     Breath sounds: Normal breath sounds.  Musculoskeletal:     Comments: Right knee reveals no effusion.  No warmth.  No erythema.  No ecchymosis.  Good range of motion.  Mild medial joint line tenderness.  Neurological:     Mental Status: She is alert.     BP 130/68   Pulse 84   Temp 98 F (36.7 C) (Oral)   Ht 5\' 3"  (1.6 m)   Wt 159 lb (72.1 kg)   SpO2 97%   BMI 28.17 kg/m  Wt Readings from Last 3 Encounters:  07/15/20 159 lb (72.1 kg)  06/01/20 160 lb 1.6 oz (72.6 kg)  11/25/19 160 lb 14.4 oz (73 kg)     Health Maintenance Due  Topic Date Due  . COLONOSCOPY (Pts 45-72yrs Insurance coverage will need to be confirmed)  03/10/2019    There are no preventive care reminders to display for this patient.  Lab Results  Component Value Date   TSH 1.34 01/15/2017   Lab Results  Component Value Date   WBC 8.1 01/15/2017   HGB 13.6 01/15/2017   HCT 40.2 01/15/2017   MCV 93.5 01/15/2017   PLT 175.0 01/15/2017   Lab Results  Component Value Date   NA 132 (L) 11/25/2019   K 4.7 11/25/2019   CO2 29 11/25/2019   GLUCOSE 105 (H) 11/25/2019   BUN 16 11/25/2019   CREATININE 0.70 11/25/2019   BILITOT 0.6 11/25/2019   ALKPHOS 86 01/17/2018   AST 25 11/25/2019   ALT 29 11/25/2019   PROT 7.2 11/25/2019   ALBUMIN 4.7 01/17/2018   CALCIUM 10.2 11/25/2019   GFR 100.91 01/17/2018   Lab Results  Component Value Date   CHOL 152 11/25/2019   Lab Results  Component Value Date   HDL 57 11/25/2019   Lab Results  Component Value Date   LDLCALC 72 11/25/2019   Lab Results  Component Value Date   TRIG 155 (H) 11/25/2019   Lab Results  Component  Value Date   CHOLHDL 2.7 11/25/2019   No results found for: HGBA1C    Assessment & Plan:   Right knee pain.  Suspect related to  degenerative arthritis.  Symptoms slightly improved today compared to last week  -We discussed pros and cons of steroid injection.  At this point she wishes to wait. -We discussed importance of regular exercise as a cornerstone of treating knee osteoarthritis especially quadricep strengthening and she plans to get back to doing some home exercises -Consider trial of over-the-counter diclofenac gel to use as needed  No orders of the defined types were placed in this encounter.   Follow-up: No follow-ups on file.    Carolann Littler, MD

## 2020-07-15 NOTE — Patient Instructions (Signed)
Osteoarthritis  Osteoarthritis is a type of arthritis. It refers to joint pain or joint disease. Osteoarthritis affects tissue that covers the ends of bones in joints (cartilage). Cartilage acts as a cushion between the bones and helps them move smoothly. Osteoarthritis occurs when cartilage in the joints gets worn down. Osteoarthritis is sometimes called "wear and tear" arthritis. Osteoarthritis is the most common form of arthritis. It often occurs in older people. It is a condition that gets worse over time. The joints most often affected by this condition are in the fingers, toes, hips, knees, and spine, including the neck and lower back. What are the causes? This condition is caused by the wearing down of cartilage that covers the ends of bones. What increases the risk? The following factors may make you more likely to develop this condition:  Being age 50 or older.  Obesity.  Overuse of joints.  Past injury of a joint.  Past surgery on a joint.  Family history of osteoarthritis. What are the signs or symptoms? The main symptoms of this condition are pain, swelling, and stiffness in the joint. Other symptoms may include:  An enlarged joint.  More pain and further damage caused by small pieces of bone or cartilage that break off and float inside of the joint.  Small deposits of bone (osteophytes) that grow on the edges of the joint.  A grating or scraping feeling inside the joint when you move it.  Popping or creaking sounds when you move.  Difficulty walking or exercising.  An inability to grip items, twist your hand(s), or control the movements of your hands and fingers. How is this diagnosed? This condition may be diagnosed based on:  Your medical history.  A physical exam.  Your symptoms.  X-rays of the affected joint(s).  Blood tests to rule out other types of arthritis. How is this treated? There is no cure for this condition, but treatment can help control  pain and improve joint function. Treatment may include a combination of therapies, such as:  Pain relief techniques, such as: ? Applying heat and cold to the joint. ? Massage. ? A form of talk therapy called cognitive behavioral therapy (CBT). This therapy helps you set goals and follow up on the changes that you make.  Medicines for pain and inflammation. The medicines can be taken by mouth or applied to the skin. They include: ? NSAIDs, such as ibuprofen. ? Prescription medicines. ? Strong anti-inflammatory medicines (corticosteroids). ? Certain nutritional supplements.  A prescribed exercise program. You may work with a physical therapist.  Assistive devices, such as a brace, wrap, splint, specialized glove, or cane.  A weight control plan.  Surgery, such as: ? An osteotomy. This is done to reposition the bones and relieve pain or to remove loose pieces of bone and cartilage. ? Joint replacement surgery. You may need this surgery if you have advanced osteoarthritis. Follow these instructions at home: Activity  Rest your affected joints as told by your health care provider.  Exercise as told by your health care provider. He or she may recommend specific types of exercise, such as: ? Strengthening exercises. These are done to strengthen the muscles that support joints affected by arthritis. ? Aerobic activities. These are exercises, such as brisk walking or water aerobics, that increase your heart rate. ? Range-of-motion activities. These help your joints move more easily. ? Balance and agility exercises. Managing pain, stiffness, and swelling  If directed, apply heat to the affected area as often   as told by your health care provider. Use the heat source that your health care provider recommends, such as a moist heat pack or a heating pad. ? If you have a removable assistive device, remove it as told by your health care provider. ? Place a towel between your skin and the heat  source. If your health care provider tells you to keep the assistive device on while you apply heat, place a towel between the assistive device and the heat source. ? Leave the heat on for 20-30 minutes. ? Remove the heat if your skin turns bright red. This is especially important if you are unable to feel pain, heat, or cold. You may have a greater risk of getting burned.  If directed, put ice on the affected area. To do this: ? If you have a removable assistive device, remove it as told by your health care provider. ? Put ice in a plastic bag. ? Place a towel between your skin and the bag. If your health care provider tells you to keep the assistive device on during icing, place a towel between the assistive device and the bag. ? Leave the ice on for 20 minutes, 2-3 times a day. ? Move your fingers or toes often to reduce stiffness and swelling. ? Raise (elevate) the injured area above the level of your heart while you are sitting or lying down.      General instructions  Take over-the-counter and prescription medicines only as told by your health care provider.  Maintain a healthy weight. Follow instructions from your health care provider for weight control.  Do not use any products that contain nicotine or tobacco, such as cigarettes, e-cigarettes, and chewing tobacco. If you need help quitting, ask your health care provider.  Use assistive devices as told by your health care provider.  Keep all follow-up visits as told by your health care provider. This is important. Where to find more information  [employer] of Arthritis and Musculoskeletal and Skin Diseases: www.niams.South[employer].es  [employer] on Aging: http://kim-miller.com/  American College of Rheumatology: www.rheumatology.org Contact a health care provider if:  You have redness, swelling, or a feeling of warmth in a joint that gets worse.  You have a fever along with joint or muscle aches.  You develop a  rash.  You have trouble doing your normal activities. Get help right away if:  You have pain that gets worse and is not relieved by pain medicine. Summary  Osteoarthritis is a type of arthritis that affects tissue covering the ends of bones in joints (cartilage).  This condition is caused by the wearing down of cartilage that covers the ends of bones.  The main symptom of this condition is pain, swelling, and stiffness in the joint.  There is no cure for this condition, but treatment can help control pain and improve joint function. This information is not intended to replace advice given to you by your health care provider. Make sure you discuss any questions you have with your health care provider. Document Revised: 02/09/2019 Document Reviewed: 02/09/2019 Elsevier Patient Education  2021 [place].  Do quadriceps strengthening as discussed  Try the over the counter Voltaren/Diclofenac gel.

## 2020-07-30 ENCOUNTER — Other Ambulatory Visit: Payer: Self-pay | Admitting: Family Medicine

## 2020-08-02 ENCOUNTER — Other Ambulatory Visit: Payer: Self-pay

## 2020-08-02 ENCOUNTER — Encounter (INDEPENDENT_AMBULATORY_CARE_PROVIDER_SITE_OTHER): Payer: Medicare PPO | Admitting: Ophthalmology

## 2020-08-02 DIAGNOSIS — H35033 Hypertensive retinopathy, bilateral: Secondary | ICD-10-CM | POA: Diagnosis not present

## 2020-08-02 DIAGNOSIS — H43813 Vitreous degeneration, bilateral: Secondary | ICD-10-CM

## 2020-08-02 DIAGNOSIS — D3132 Benign neoplasm of left choroid: Secondary | ICD-10-CM

## 2020-08-02 DIAGNOSIS — H33302 Unspecified retinal break, left eye: Secondary | ICD-10-CM | POA: Diagnosis not present

## 2020-08-02 DIAGNOSIS — I10 Essential (primary) hypertension: Secondary | ICD-10-CM | POA: Diagnosis not present

## 2020-08-02 DIAGNOSIS — H353132 Nonexudative age-related macular degeneration, bilateral, intermediate dry stage: Secondary | ICD-10-CM

## 2020-09-05 ENCOUNTER — Telehealth: Payer: Self-pay | Admitting: Family Medicine

## 2020-09-05 NOTE — Telephone Encounter (Signed)
Left message for patient to call back and schedule Medicare Annual Wellness Visit (AWV) either virtually or in office.   Last AWV 02/10/2019  please schedule at anytime with [facility] Nurse Health Advisor 1 or 2   This should be a 45 minute visit.

## 2020-09-10 ENCOUNTER — Other Ambulatory Visit: Payer: Self-pay | Admitting: Family Medicine

## 2020-09-15 NOTE — Progress Notes (Deleted)
Subjective:   Cassandra Foster is a 80 y.o. female who presents for Medicare Annual (Subsequent) preventive examination.  Review of Systems    N/a       Objective:    There were no vitals filed for this visit. There is no height or weight on file to calculate BMI.  Advanced Directives 02/10/2019 03/09/2014 02/24/2014  Does Patient Have a Medical Advance Directive? No Yes Yes  Type of Advance Directive - Healthcare Power of [place];Living will  Would patient like information on creating a medical advance directive? Yes (MAU/Ambulatory/Procedural Areas - Information given) - -    Current Medications (verified) Outpatient Encounter Medications as of 09/15/2020  Medication Sig   albuterol (PROVENTIL HFA;VENTOLIN HFA) 108 (90 Base) MCG/ACT inhaler Inhale 2 puffs into the lungs every 4 (four) hours as needed for wheezing or shortness of breath.   amLODipine (NORVASC) 5 MG tablet TAKE 1 TABLET DAILY   aspirin EC 81 MG tablet 81 mg. Take 3 tabs daily   Cholecalciferol (VITAMIN D3) 5000 UNITS CAPS Take 1 capsule by mouth daily.   denosumab (PROLIA) 60 MG/ML SOSY injection Inject 60 mg into the skin every 6 (six) months.   diclofenac Sodium (VOLTAREN) 1 % GEL Apply 4 g topically 4 (four) times daily. To affected joint.   fish oil-omega-3 fatty acids 1000 MG capsule Take 2 g by mouth daily.   losartan-hydrochlorothiazide (HYZAAR) 100-12.5 MG tablet TAKE 1 TABLET DAILY   mometasone (ELOCON) 0.1 % lotion APPLY TOPICALLY ONCE A DAY AS DIRECTED   Multiple Vitamins-Minerals (PRESERVISION AREDS 2 PO) Take by mouth daily.   pravastatin (PRAVACHOL) 20 MG tablet TAKE 1 TABLET DAILY   traZODone (DESYREL) 50 MG tablet Take 2 tablets (100 mg total) by mouth at bedtime.   No facility-administered encounter medications on file as of 09/15/2020.    Allergies (verified) Simvastatin   History: Past Medical History:  Diagnosis Date   Chronic insomnia    Colon polyps     Hyperlipidemia    Hypertension    Osteoporosis    Past Surgical History:  Procedure Laterality Date   BREAST EXCISIONAL BIOPSY Left    benign, lump removed many years ago   [place]  multiple   KNEE SURGERY  2009   kneecap   OTHER SURGICAL HISTORY     childbirth x 3   TUBAL LIGATION     BP dropped    Family History  Problem Relation Age of Onset   Cancer Mother        breast   Breast cancer Mother    Stroke Father    Colon cancer Brother 47   Prostate cancer Brother    Colon cancer Other 78   Social History   Socioeconomic History   Marital status: Married    Spouse name: Not on file   Number of children: 3   Years of education: high school graduate   Highest education level: High school graduate  Occupational History   Occupation: retired  Tobacco Use   Smoking status: Never   Smokeless tobacco: Never  Scientific laboratory technician Use: Never used  Substance and Sexual Activity   Alcohol use: No   Drug use: No   Sexual activity: Not on file  Other Topics Concern   Not on file  Social History Narrative   Retired   Married   3 sons   5 grand-children  Social Determinants of Health   Financial Resource Strain: Not on file  Food Insecurity: Not on file  Transportation Needs: Not on file  Physical Activity: Not on file  Stress: Not on file  Social Connections: Not on file    Tobacco Counseling Counseling given: Not Answered   Clinical Intake:                 Diabetic?no         Activities of Daily Living In your present state of health, do you have any difficulty performing the following activities: 07/15/2020  Hearing? N  Vision? N  Difficulty concentrating or making decisions? N  Walking or climbing stairs? N  Dressing or bathing? N  Doing errands, shopping? N  Some recent data might be hidden    Patient Care Team: Eulas Post, MD as PCP - General (Family Medicine)  Indicate any recent  Medical Services you may have received from other than [facility] providers in the past year (date may be approximate).     Assessment:   This is a routine wellness examination for Cassandra Foster.  Hearing/Vision screen No results found.  Dietary issues and exercise activities discussed:     Goals Addressed   None    Depression Screen PHQ 2/9 Scores 07/15/2020 02/10/2019 01/07/2017 09/26/2015 09/23/2014 07/30/2013 07/30/2013  PHQ - 2 Score 0 0 0 0 0 0 0  PHQ- 9 Score 0 - - - - - -    Fall Risk Fall Risk  07/15/2020 02/10/2019 10/08/2017 01/07/2017 09/26/2015  Falls in the past year? 0 0 Yes No No  Number falls in past yr: - 0 1 - -  Risk for fall due to : - Medication side effect;Orthopedic patient - - -  Follow up - Falls evaluation completed;Education provided;Falls prevention discussed - - -    FALL RISK PREVENTION PERTAINING TO THE HOME:  Any stairs in or around the home? {YES/NO:21197} If so, are there any without handrails? {YES/NO:21197} Home free of loose throw rugs in walkways, pet beds, electrical cords, etc? {YES/NO:21197} Adequate lighting in your home to reduce risk of falls? {YES/NO:21197}  ASSISTIVE DEVICES UTILIZED TO PREVENT FALLS:  Life alert? {YES/NO:21197} Use of a cane, walker or w/c? {YES/NO:21197} Grab bars in the bathroom? {YES/NO:21197} Shower chair or bench in shower? {YES/NO:21197} Elevated toilet seat or a handicapped toilet? {YES/NO:21197}  TIMED UP AND GO:  Was the test performed? {YES/NO:21197}.  Length of time to ambulate 10 feet: *** sec.   {Appearance of [id number]  Cognitive Function:     6CIT Screen 02/10/2019  What Year? 0 points  What month? 0 points  What time? 0 points  Count back from 20 0 points  Months in reverse 0 points  Repeat phrase 0 points  Total Score 0    Immunizations Immunization History  Administered Date(s) Administered   Fluad Quad(high Dose 65+) 11/13/2018, 11/25/2019   Influenza Split 11/30/2010, 01/10/2012    Influenza, High Dose Seasonal PF 12/19/2012, 12/30/2013, 01/15/2017   Influenza,inj,Quad PF,6+ Mos 12/05/2017   Influenza-Unspecified 03/14/2015, 12/05/2017   Moderna Sars-Covid-2 Vaccination 04/07/2019, 05/05/2019, 03/15/2020   Pneumococcal Conjugate-13 07/30/2013   Pneumococcal Polysaccharide-23 09/26/2015   Td 01/17/2018   Tdap 04/26/2007   Zoster Recombinat (Shingrix) 08/05/2017, 10/12/2017    {TDAP status:2101805}  {Flu Vaccine status:2101806}  {Pneumococcal vaccine status:2101807}  {Covid-19 vaccine status:2101808}  Qualifies for Shingles Vaccine? {YES/NO:21197}  Zostavax completed {YES/NO:21197}  {Shingrix Completed?:2101804}  Screening Tests Health Maintenance  Topic Date Due   COLONOSCOPY (Pts  45-5yrs Insurance coverage will need to be confirmed)  03/10/2019   COVID-19 Vaccine (4 - Booster for Moderna series) 07/13/2020   INFLUENZA VACCINE  09/26/2020   TETANUS/TDAP  01/18/2028   DEXA SCAN  Completed   PNA vac Low Risk Adult  Completed   Zoster Vaccines- Shingrix  Completed   HPV VACCINES  Aged Out    Health Maintenance  Health Maintenance Due  Topic Date Due   COLONOSCOPY (Pts 45-72yrs Insurance coverage will need to be confirmed)  03/10/2019   COVID-19 Vaccine (4 - Booster for Moderna series) 07/13/2020    {Colorectal cancer screening:2101809}  {Mammogram status:21018020}  {Bone Density status:21018021}  Lung Cancer Screening: (Low Dose CT Chest recommended if Age 93-80 years, 30 pack-year currently smoking OR have quit w/in 15years.) {DOES NOT does:27190::"does not"} qualify.   Lung Cancer Screening Referral: ***  Additional Screening:  Hepatitis C Screening: {DOES NOT does:27190::"does not"} qualify; Completed ***  Vision Screening: Recommended annual ophthalmology exams for early detection of glaucoma and other disorders of the eye. Is the patient up to date with their annual eye exam?  {YES/NO:21197} Who is the provider or what is the name  of the office in which the patient attends annual eye exams? *** If pt is not established with a provider, would they like to be referred to a provider to establish care? {YES/NO:21197}.   Dental Screening: Recommended annual dental exams for proper oral hygiene  Community Resource Referral / Chronic Care Management: CRR required this visit?  {YES/NO:21197}  CCM required this visit?  {YES/NO:21197}     Plan:     I have personally reviewed and noted the following in the patient's chart:   Medical and social history Use of alcohol, tobacco or illicit drugs  Current medications and supplements including opioid prescriptions.  Functional ability and status Nutritional status Physical activity Advanced directives List of other physicians Hospitalizations, surgeries, and ER visits in previous 12 months Vitals Screenings to include cognitive, depression, and falls Referrals and appointments  In addition, I have reviewed and discussed with patient certain preventive protocols, quality metrics, and best practice recommendations. A written personalized care plan for preventive services as well as general preventive health recommendations were provided to patient.     Randel Pigg, LPN   0/10/2328   Nurse Notes: ***

## 2020-09-28 ENCOUNTER — Telehealth: Payer: Self-pay

## 2020-09-28 NOTE — Telephone Encounter (Signed)
I left a message with patient's husband - okay to schedule prolia.  Needs to be on or after 10/10/20. Estimated cost is $0.

## 2020-10-03 ENCOUNTER — Encounter: Payer: Medicare PPO | Admitting: Family Medicine

## 2020-10-05 ENCOUNTER — Ambulatory Visit: Payer: Medicare PPO

## 2020-10-11 ENCOUNTER — Other Ambulatory Visit: Payer: Self-pay

## 2020-10-11 ENCOUNTER — Encounter: Payer: Self-pay | Admitting: Family Medicine

## 2020-10-11 ENCOUNTER — Ambulatory Visit (INDEPENDENT_AMBULATORY_CARE_PROVIDER_SITE_OTHER): Payer: Medicare PPO | Admitting: Family Medicine

## 2020-10-11 ENCOUNTER — Ambulatory Visit (INDEPENDENT_AMBULATORY_CARE_PROVIDER_SITE_OTHER): Payer: Medicare PPO

## 2020-10-11 VITALS — BP 134/60 | HR 74 | Temp 98.0°F | Ht 63.0 in | Wt 159.3 lb

## 2020-10-11 DIAGNOSIS — Z0000 Encounter for general adult medical examination without abnormal findings: Secondary | ICD-10-CM | POA: Diagnosis not present

## 2020-10-11 LAB — LIPID PANEL
Cholesterol: 138 mg/dL (ref 0–200)
HDL: 54.1 mg/dL (ref >39.00)
LDL Cholesterol: 60 mg/dL (ref 0–99)
NonHDL: 83.76
Total CHOL/HDL Ratio: 3
Triglycerides: 118 mg/dL (ref 0.0–149.0)
VLDL: 23.6 mg/dL (ref 0.0–40.0)

## 2020-10-11 LAB — HEPATIC FUNCTION PANEL
ALT: 29 U/L (ref 0–35)
AST: 22 U/L (ref 0–37)
Albumin: 4.4 g/dL (ref 3.5–5.2)
Alkaline Phosphatase: 81 U/L (ref 39–117)
Bilirubin, Direct: 0.1 mg/dL (ref 0.0–0.3)
Total Bilirubin: 0.7 mg/dL (ref 0.2–1.2)
Total Protein: 7 g/dL (ref 6.0–8.3)

## 2020-10-11 LAB — VITAMIN D 25 HYDROXY (VIT D DEFICIENCY, FRACTURES): VITD: 64.84 ng/mL (ref 30.00–100.00)

## 2020-10-11 LAB — BASIC METABOLIC PANEL
BUN: 17 mg/dL (ref 6–23)
CO2: 26 mEq/L (ref 19–32)
Calcium: 9.5 mg/dL (ref 8.4–10.5)
Chloride: 95 mEq/L — ABNORMAL LOW (ref 96–112)
Creatinine, Ser: 0.67 mg/dL (ref 0.40–1.20)
GFR: 82.56 mL/min (ref >60.00)
Glucose, Bld: 91 mg/dL (ref 70–99)
Potassium: 4.1 mEq/L (ref 3.5–5.1)
Sodium: 130 mEq/L — ABNORMAL LOW (ref 135–145)

## 2020-10-11 NOTE — Patient Instructions (Signed)
[name] , Thank you for taking time to come for your Medicare Wellness Visit. I appreciate your ongoing commitment to your health goals. Please review the following plan we discussed and let me know if I can assist you in the future.   Screening recommendations/referrals: Colonoscopy: No longer required  Mammogram: Done 05/26/19 repeat every year Bone Density: Done 05/26/19 repeat every 2 years Recommended yearly ophthalmology/optometry visit for glaucoma screening and checkup Recommended yearly dental visit for hygiene and checkup  Vaccinations: Influenza vaccine: Due Pneumococcal vaccine: Completed  Tdap vaccine: Done 01/17/18 repeat every 10 years  Shingles vaccine: Completed 6/10 & 10/12/17   Covid-19:Completed 1/18, 3/9, 2/9  Advanced directives: Advance directive discussed with you today. Even though you declined this today please call our office should you change your mind and we can give you the proper paperwork for you to fill out.  Conditions/risks identified: Stay Healthy  Next appointment: Follow up in one year for your annual wellness visit    Preventive Care 65 Years and Older, Female Preventive care refers to lifestyle choices and visits with your health care provider that can promote health and wellness. What does preventive care include? A yearly physical exam. This is also called an annual well check. Dental exams once or twice a year. Routine eye exams. Ask your health care provider how often you should have your eyes checked. Personal lifestyle choices, including: Daily care of your teeth and gums. Regular physical activity. Eating a healthy diet. Avoiding tobacco and drug use. Limiting alcohol use. Practicing safe sex. Taking low-dose aspirin every day. Taking vitamin and mineral supplements as recommended by your health care provider. What happens during an annual well check? The services and screenings done by your health care provider during your  annual well check will depend on your age, overall health, lifestyle risk factors, and family history of disease. Counseling  Your health care provider may ask you questions about your: Alcohol use. Tobacco use. Drug use. Emotional well-being. Home and relationship well-being. Sexual activity. Eating habits. History of falls. Memory and ability to understand (cognition). Work and work Statistician. Reproductive health. Screening  You may have the following tests or measurements: Height, weight, and BMI. Blood pressure. Lipid and cholesterol levels. These may be checked every 5 years, or more frequently if you are over 89 years old. Skin check. Lung cancer screening. You may have this screening every year starting at age 85 if you have a 30-pack-year history of smoking and currently smoke or have quit within the past 15 years. Fecal occult blood test (FOBT) of the stool. You may have this test every year starting at age 8. Flexible sigmoidoscopy or colonoscopy. You may have a sigmoidoscopy every 5 years or a colonoscopy every 10 years starting at age 26. Hepatitis C blood test. Hepatitis B blood test. Sexually transmitted disease (STD) testing. Diabetes screening. This is done by checking your blood sugar (glucose) after you have not eaten for a while (fasting). You may have this done every 1-3 years. Bone density scan. This is done to screen for osteoporosis. You may have this done starting at age 72. Mammogram. This may be done every 1-2 years. Talk to your health care provider about how often you should have regular mammograms. Talk with your health care provider about your test results, treatment options, and if necessary, the need for more tests. Vaccines  Your health care provider may recommend certain vaccines, such as: Influenza vaccine. This is recommended every year. Tetanus, diphtheria,  and acellular pertussis (Tdap, Td) vaccine. You may need a Td booster every 10  years. Zoster vaccine. You may need this after age 46. Pneumococcal 13-valent conjugate (PCV13) vaccine. One dose is recommended after age 28. Pneumococcal polysaccharide (PPSV23) vaccine. One dose is recommended after age 55. Talk to your health care provider about which screenings and vaccines you need and how often you need them. This information is not intended to replace advice given to you by your health care provider. Make sure you discuss any questions you have with your health care provider. Document Released: 03/11/2015 Document Revised: 11/02/2015 Document Reviewed: 12/14/2014 Elsevier Interactive Patient Education  2017 [place] Prevention in the Home Falls can cause injuries. They can happen to people of all ages. There are many things you can do to make your home safe and to help prevent falls. What can I do on the outside of my home? Regularly fix the edges of walkways and driveways and fix any cracks. Remove anything that might make you trip as you walk through a door, such as a raised step or threshold. Trim any bushes or trees on the path to your home. Use bright outdoor lighting. Clear any walking paths of anything that might make someone trip, such as rocks or tools. Regularly check to see if handrails are loose or broken. Make sure that both sides of any steps have handrails. Any raised decks and porches should have guardrails on the edges. Have any leaves, snow, or ice cleared regularly. Use sand or salt on walking paths during winter. Clean up any spills in your garage right away. This includes oil or grease spills. What can I do in the bathroom? Use night lights. Install grab bars by the toilet and in the tub and shower. Do not use towel bars as grab bars. Use non-skid mats or decals in the tub or shower. If you need to sit down in the shower, use a plastic, non-slip stool. Keep the floor dry. Clean up any water that spills on the floor as soon as it  happens. Remove soap buildup in the tub or shower regularly. Attach bath mats securely with double-sided non-slip rug tape. Do not have throw rugs and other things on the floor that can make you trip. What can I do in the bedroom? Use night lights. Make sure that you have a light by your bed that is easy to reach. Do not use any sheets or blankets that are too big for your bed. They should not hang down onto the floor. Have a firm chair that has side arms. You can use this for support while you get dressed. Do not have throw rugs and other things on the floor that can make you trip. What can I do in the kitchen? Clean up any spills right away. Avoid walking on wet floors. Keep items that you use a lot in easy-to-reach places. If you need to reach something above you, use a strong step stool that has a grab bar. Keep electrical cords out of the way. Do not use floor polish or wax that makes floors slippery. If you must use wax, use non-skid floor wax. Do not have throw rugs and other things on the floor that can make you trip. What can I do with my stairs? Do not leave any items on the stairs. Make sure that there are handrails on both sides of the stairs and use them. Fix handrails that are broken or loose. Make sure  that handrails are as long as the stairways. Check any carpeting to make sure that it is firmly attached to the stairs. Fix any carpet that is loose or worn. Avoid having throw rugs at the top or bottom of the stairs. If you do have throw rugs, attach them to the floor with carpet tape. Make sure that you have a light switch at the top of the stairs and the bottom of the stairs. If you do not have them, ask someone to add them for you. What else can I do to help prevent falls? Wear shoes that: Do not have high heels. Have rubber bottoms. Are comfortable and fit you well. Are closed at the toe. Do not wear sandals. If you use a stepladder: Make sure that it is fully opened.  Do not climb a closed stepladder. Make sure that both sides of the stepladder are locked into place. Ask someone to hold it for you, if possible. Clearly mark and make sure that you can see: Any grab bars or handrails. First and last steps. Where the edge of each step is. Use tools that help you move around (mobility aids) if they are needed. These include: Canes. Walkers. Scooters. Crutches. Turn on the lights when you go into a dark area. Replace any light bulbs as soon as they burn out. Set up your furniture so you have a clear path. Avoid moving your furniture around. If any of your floors are uneven, fix them. If there are any pets around you, be aware of where they are. Review your medicines with your doctor. Some medicines can make you feel dizzy. This can increase your chance of falling. Ask your doctor what other things that you can do to help prevent falls. This information is not intended to replace advice given to you by your health care provider. Make sure you discuss any questions you have with your health care provider. Document Released: 12/09/2008 Document Revised: 07/21/2015 Document Reviewed: 03/19/2014 Elsevier Interactive Patient Education  2017 [employer].

## 2020-10-11 NOTE — Progress Notes (Signed)
Subjective:   Cassandra Foster is a 80 y.o. female who presents for Medicare Annual (Subsequent) preventive examination.  Review of Systems     Cardiac Risk Factors include: advanced age (>50mn, >>30women);hypertension;dyslipidemia     Objective:    There were no vitals filed for this visit. There is no height or weight on file to calculate BMI.  Advanced Directives 10/11/2020 02/10/2019 03/09/2014 02/24/2014  Does Patient Have a Medical Advance Directive? Yes No Yes Yes  Type of Advance Directive - -Scientist, water qualityof [place] will  Does patient want to make changes to medical advance directive? Yes (MAU/Ambulatory/Procedural Areas - Information given) - - -  Would patient like information on creating a medical advance directive? - Yes (MAU/Ambulatory/Procedural Areas - Information given) - -    Current Medications (verified) Outpatient Encounter Medications as of 10/11/2020  Medication Sig   albuterol (PROVENTIL HFA;VENTOLIN HFA) 108 (90 Base) MCG/ACT inhaler Inhale 2 puffs into the lungs every 4 (four) hours as needed for wheezing or shortness of breath.   amLODipine (NORVASC) 5 MG tablet TAKE 1 TABLET DAILY   aspirin EC 81 MG tablet 81 mg. Take 3 tabs daily   Cholecalciferol (VITAMIN D3) 5000 UNITS CAPS Take 1 capsule by mouth daily.   denosumab (PROLIA) 60 MG/ML SOSY injection Inject 60 mg into the skin every 6 (six) months.   diclofenac Sodium (VOLTAREN) 1 % GEL Apply 4 g topically 4 (four) times daily. To affected joint.   fish oil-omega-3 fatty acids 1000 MG capsule Take 2 g by mouth daily.   losartan-hydrochlorothiazide (HYZAAR) 100-12.5 MG tablet TAKE 1 TABLET DAILY   mometasone (ELOCON) 0.1 % lotion APPLY TOPICALLY ONCE A DAY AS DIRECTED   Multiple Vitamins-Minerals (PRESERVISION AREDS 2 PO) Take by mouth daily.   pravastatin (PRAVACHOL) 20 MG tablet TAKE 1 TABLET DAILY   traZODone (DESYREL) 50 MG tablet Take 2 tablets (100 mg  total) by mouth at bedtime.   No facility-administered encounter medications on file as of 10/11/2020.    Allergies (verified) Simvastatin   History: Past Medical History:  Diagnosis Date   Chronic insomnia    Colon polyps    Hyperlipidemia    Hypertension    Osteoporosis    Past Surgical History:  Procedure Laterality Date   BREAST EXCISIONAL BIOPSY Left    benign, lump removed many years ago   BEros multiple   KNEE SURGERY  2009   kneecap   OTHER SURGICAL HISTORY     childbirth x 3   TUBAL LIGATION     BP dropped    Family History  Problem Relation Age of Onset   Cancer Mother        breast   Breast cancer Mother    Stroke Father    Colon cancer Brother 664  Prostate cancer Brother    Colon cancer Other 649  Social History   Socioeconomic History   Marital status: Married    Spouse name: Not on file   Number of children: 3   Years of education: high school graduate   Highest education level: High school graduate  Occupational History   Occupation: retired  Tobacco Use   Smoking status: Never   Smokeless tobacco: Never  VScientific laboratory technicianUse: Never used  Substance and Sexual Activity   Alcohol use: No   Drug use: No   Sexual activity:  Not on file  Other Topics Concern   Not on file  Social History Narrative   Retired   Married   3 sons   5 grand-children   Social Determinants of Health   Financial Resource Strain: Low Risk    Difficulty of Paying Living Expenses: Not hard at all  Food Insecurity: No Food Insecurity   Worried About Charity fundraiser in the Last Year: Never true   Arboriculturist in the Last Year: Never true  Transportation Needs: No Transportation Needs   Lack of Transportation (Medical): No   Lack of Transportation (Non-Medical): No  Physical Activity: Sufficiently Active   Days of Exercise per Week: 5 days   Minutes of Exercise per Session: 30 min  Stress: No Stress Concern  Present   Feeling of Stress : Not at all  Social Connections: Moderately Integrated   Frequency of Communication with Friends and Family: More than three times a week   Frequency of Social Gatherings with Friends and Family: More than three times a week   Attends Religious Services: More than 4 times per year   Active Member of [employer] or Organizations: No   Attends Music therapist: Never   Marital Status: Married    Tobacco Counseling Counseling given: Not Answered   Clinical Intake:  Pre-visit preparation completed: Yes  Pain : No/denies pain     BMI - recorded: 28.22 Nutritional Risks: None Diabetes: No  How often do you need to have someone help you when you read instructions, pamphlets, or other written materials from your doctor or pharmacy?: 1 - Never Diabetic?no  Interpreter Needed?: No  Information entered by :: Charlott Rakes, LPN   Activities of Daily Living In your present state of health, do you have any difficulty performing the following activities: 10/11/2020 07/15/2020  Hearing? N N  Vision? N N  Difficulty concentrating or making decisions? N N  Walking or climbing stairs? N N  Dressing or bathing? N N  Doing errands, shopping? N N  Preparing Food and eating ? N -  Using the Toilet? N -  In the past six months, have you accidently leaked urine? N -  Do you have problems with loss of bowel control? N -  Managing your Medications? N -  Managing your Finances? N -  Housekeeping or managing your Housekeeping? N -  Some recent data might be hidden    Patient Care Team: Eulas Post, MD as PCP - General (Family Medicine)  Indicate any recent Medical Services you may have received from other than [facility] providers in the past year (date may be approximate).     Assessment:   This is a routine wellness examination for Cassandra Foster.  Hearing/Vision screen Hearing Screening - Comments:: Pt denies any hearing issues  Vision Screening -  Comments:: Pt follows up with Dr Kathlen Mody and Dr Jimmye Norman for annual eye exams   Dietary issues and exercise activities discussed: Current Exercise Habits: Home exercise routine, Time (Minutes): 30, Frequency (Times/Week): 5, Weekly Exercise (Minutes/Week): 150   Goals Addressed             This Visit's Progress    Patient Stated       Stay healthy       Depression Screen PHQ 2/9 Scores 10/11/2020 07/15/2020 02/10/2019 01/07/2017 09/26/2015 09/23/2014 07/30/2013  PHQ - 2 Score 0 0 0 0 0 0 0  PHQ- 9 Score - 0 - - - - -  Fall Risk Fall Risk  10/11/2020 07/15/2020 02/10/2019 10/08/2017 01/07/2017  Falls in the past year? 0 0 0 Yes No  Number falls in past yr: 0 - 0 1 -  Injury with Fall? 0 - - - -  Risk for fall due to : - - Medication side effect;Orthopedic patient - -  Follow up Falls prevention discussed - Falls evaluation completed;Education provided;Falls prevention discussed - -    FALL RISK PREVENTION PERTAINING TO THE HOME:  Any stairs in or around the home? Yes  If so, are there any without handrails? No  Home free of loose throw rugs in walkways, pet beds, electrical cords, etc? Yes  Adequate lighting in your home to reduce risk of falls? Yes   ASSISTIVE DEVICES UTILIZED TO PREVENT FALLS:  Life alert? No  Use of a cane, walker or w/c? No  Grab bars in the bathroom? No  Shower chair or bench in shower? No  Elevated toilet seat or a handicapped toilet? No   TIMED UP AND GO:  Was the test performed? No .  Cognitive Function: declined      6CIT Screen 02/10/2019  What Year? 0 points  What month? 0 points  What time? 0 points  Count back from 20 0 points  Months in reverse 0 points  Repeat phrase 0 points  Total Score 0    Immunizations Immunization History  Administered Date(s) Administered   Fluad Quad(high Dose 65+) 11/13/2018, 11/25/2019   Influenza Split 11/30/2010, 01/10/2012   Influenza, High Dose Seasonal PF 12/19/2012, 12/30/2013, 01/15/2017    Influenza,inj,Quad PF,6+ Mos 12/05/2017   Influenza-Unspecified 03/14/2015, 12/05/2017   Moderna Sars-Covid-2 Vaccination 04/07/2019, 05/05/2019, 03/15/2020   Pneumococcal Conjugate-13 07/30/2013   Pneumococcal Polysaccharide-23 09/26/2015   Td 01/17/2018   Tdap 04/26/2007   Zoster Recombinat (Shingrix) 08/05/2017, 10/12/2017    TDAP status: Up to date  Flu Vaccine status: Due, Education has been provided regarding the importance of this vaccine. Advised may receive this vaccine at local pharmacy or Health Dept. Aware to provide a copy of the vaccination record if obtained from local pharmacy or Health Dept. Verbalized acceptance and understanding.  Pneumococcal vaccine status: Up to date  Covid-19 vaccine status: Completed vaccines  Qualifies for Shingles Vaccine? Yes   Zostavax completed Yes   Shingrix Completed?: Yes  Screening Tests Health Maintenance  Topic Date Due   COVID-19 Vaccine (4 - Booster for Moderna series) 07/13/2020   INFLUENZA VACCINE  09/26/2020   TETANUS/TDAP  01/18/2028   DEXA SCAN  Completed   PNA vac Low Risk Adult  Completed   Zoster Vaccines- Shingrix  Completed   HPV VACCINES  Aged Out   COLONOSCOPY (Pts 45-60yr Insurance coverage will need to be confirmed)  Discontinued    Health Maintenance  Health Maintenance Due  Topic Date Due   COVID-19 Vaccine (4 - Booster for Moderna series) 07/13/2020   INFLUENZA VACCINE  09/26/2020    Colorectal cancer screening: No longer required.   Mammogram status: Completed 05/26/19. Repeat every year  Bone Density status: Completed 05/26/19. Results reflect: Bone density results: OSTEOPOROSIS. Repeat every 2 years.  Additional Screening:   Vision Screening: Recommended annual ophthalmology exams for early detection of glaucoma and other disorders of the eye. Is the patient up to date with their annual eye exam?  Yes  Who is the provider or what is the name of the office in which the patient attends annual  eye exams? Dr weaver and Dr WJimmye Norman If pt is  not established with a provider, would they like to be referred to a provider to establish care? No .   Dental Screening: Recommended annual dental exams for proper oral hygiene  Community Resource Referral / Chronic Care Management: CRR required this visit?  No   CCM required this visit?  No      Plan:     I have personally reviewed and noted the following in the patient's chart:   Medical and social history Use of alcohol, tobacco or illicit drugs  Current medications and supplements including opioid prescriptions.  Functional ability and status Nutritional status Physical activity Advanced directives List of other physicians Hospitalizations, surgeries, and ER visits in previous 12 months Vitals Screenings to include cognitive, depression, and falls Referrals and appointments  In addition, I have reviewed and discussed with patient certain preventive protocols, quality metrics, and best practice recommendations. A written personalized care plan for preventive services as well as general preventive health recommendations were provided to patient.     Willette Brace, LPN   [id number]   Nurse Notes: None

## 2020-10-11 NOTE — Progress Notes (Signed)
Established Patient Office Visit  Subjective:  Patient ID: Cassandra Foster, female    DOB: 02-09-1941  Age: 80 y.o. MRN: [id number]  CC:  Chief Complaint  Patient presents with   Annual Exam    HPI Cassandra Foster presents for physical exam.  She still sees GYN.  She has history of hypertension, osteoporosis, GERD, hyperlipidemia, vitamin D deficiency.  She does take vitamin D replacement.  She has had previous right vitreous detachment.  She remains on medication for cholesterol and hypertension.  She is due for follow-up labs.  Requesting vitamin D level.  Health maintenance reviewed  -She gets yearly mammograms and plans to set up mammogram soon -Last DEXA 3/21 -Has had shingles vaccine -Aged out of colonoscopy -Pneumonia vaccines complete -Tetanus due 2029 -Gets annual flu vaccines  Social history-she has been married for 62 years.  She has a son that lives next door who had past history of head injury and they have had some behavioral issues with him.  No history of smoking.  No alcohol use.  Has been getting ready to turn 85.  She has couple children  Family history-mother had breast cancer.  Father had stroke.  She has 2 brothers 1 alive 1 deceased.  1 brother did have colon cancer.  Other brother had prostate cancer.  Past Medical History:  Diagnosis Date   Chronic insomnia    Colon polyps    Hyperlipidemia    Hypertension    Osteoporosis     Past Surgical History:  Procedure Laterality Date   BREAST EXCISIONAL BIOPSY Left    benign, lump removed many years ago   [place]  multiple   KNEE SURGERY  2009   kneecap   OTHER SURGICAL HISTORY     childbirth x 3   TUBAL LIGATION     BP dropped     Family History  Problem Relation Age of Onset   Cancer Mother        breast   Breast cancer Mother    Stroke Father    Colon cancer Brother 57   Prostate cancer Brother    Colon cancer Other 37    Social History    Socioeconomic History   Marital status: Married    Spouse name: Not on file   Number of children: 3   Years of education: high school graduate   Highest education level: High school graduate  Occupational History   Occupation: retired  Tobacco Use   Smoking status: Never   Smokeless tobacco: Never  Scientific laboratory technician Use: Never used  Substance and Sexual Activity   Alcohol use: No   Drug use: No   Sexual activity: Not on file  Other Topics Concern   Not on file  Social History Narrative   Retired   Married   3 sons   5 grand-children   Social Determinants of Health   Financial Resource Strain: Low Risk    Difficulty of Paying Living Expenses: Not hard at all  Food Insecurity: No Food Insecurity   Worried About Charity fundraiser in the Last Year: Never true   Arboriculturist in the Last Year: Never true  Transportation Needs: No Transportation Needs   Lack of Transportation (Medical): No   Lack of Transportation (Non-Medical): No  Physical Activity: Sufficiently Active   Days of Exercise per Week: 5 days   Minutes of Exercise per  Session: 30 min  Stress: No Stress Concern Present   Feeling of Stress : Not at all  Social Connections: Moderately Integrated   Frequency of Communication with Friends and Family: More than three times a week   Frequency of Social Gatherings with Friends and Family: More than three times a week   Attends Religious Services: More than 4 times per year   Active Member of [employer] or Organizations: No   Attends Archivist Meetings: Never   Marital Status: Married  Human resources officer Violence: Not At Risk   Fear of Current or Ex-Partner: No   Emotionally Abused: No   Physically Abused: No   Sexually Abused: No    Outpatient Medications Prior to Visit  Medication Sig Dispense Refill   albuterol (PROVENTIL HFA;VENTOLIN HFA) 108 (90 Base) MCG/ACT inhaler Inhale 2 puffs into the lungs every 4 (four) hours as needed for wheezing or  shortness of breath. 1 Inhaler 0   amLODipine (NORVASC) 5 MG tablet TAKE 1 TABLET DAILY 90 tablet 0   aspirin EC 81 MG tablet 81 mg. Take 3 tabs daily     Cholecalciferol (VITAMIN D3) 5000 UNITS CAPS Take 1 capsule by mouth daily.     denosumab (PROLIA) 60 MG/ML SOSY injection Inject 60 mg into the skin every 6 (six) months.     diclofenac Sodium (VOLTAREN) 1 % GEL Apply 4 g topically 4 (four) times daily. To affected joint. 100 g 11   fish oil-omega-3 fatty acids 1000 MG capsule Take 2 g by mouth daily.     losartan-hydrochlorothiazide (HYZAAR) 100-12.5 MG tablet TAKE 1 TABLET DAILY 90 tablet 0   mometasone (ELOCON) 0.1 % lotion APPLY TOPICALLY ONCE A DAY AS DIRECTED 60 mL 1   Multiple Vitamins-Minerals (PRESERVISION AREDS 2 PO) Take by mouth daily.     pravastatin (PRAVACHOL) 20 MG tablet TAKE 1 TABLET DAILY 90 tablet 0   traZODone (DESYREL) 50 MG tablet Take 2 tablets (100 mg total) by mouth at bedtime. 180 tablet 1   No facility-administered medications prior to visit.    Allergies  Allergen Reactions   Simvastatin Other (See Comments)    Body aches    ROS Review of Systems  Constitutional:  Negative for activity change, appetite change, fatigue, fever and unexpected weight change.  HENT:  Negative for ear pain, hearing loss, sore throat and trouble swallowing.   Eyes:  Negative for visual disturbance.  Respiratory:  Negative for cough and shortness of breath.   Cardiovascular:  Negative for chest pain and palpitations.  Gastrointestinal:  Negative for abdominal pain, blood in stool, constipation and diarrhea.  Endocrine: Negative for polydipsia and polyuria.  Genitourinary:  Negative for dysuria and hematuria.  Musculoskeletal:  Negative for arthralgias, back pain and myalgias.  Skin:  Negative for rash.  Neurological:  Negative for dizziness, syncope and headaches.  Hematological:  Negative for adenopathy.  Psychiatric/Behavioral:  Negative for confusion and dysphoric mood.       Objective:    Physical Exam Constitutional:      Appearance: She is well-developed.  HENT:     Head: Normocephalic and atraumatic.     Right Ear: Tympanic membrane and ear canal normal.     Left Ear: Tympanic membrane and ear canal normal.  Eyes:     Pupils: Pupils are equal, round, and reactive to light.  Neck:     Thyroid: No thyromegaly.  Cardiovascular:     Rate and Rhythm: Normal rate and regular rhythm.  Heart sounds: Normal heart sounds. No murmur heard. Pulmonary:     Effort: No respiratory distress.     Breath sounds: Normal breath sounds. No wheezing or rales.  Abdominal:     General: Bowel sounds are normal. There is no distension.     Palpations: Abdomen is soft. There is no mass.     Tenderness: There is no abdominal tenderness. There is no guarding or rebound.  Musculoskeletal:        General: Normal range of motion.     Cervical back: Normal range of motion and neck supple.     Right lower leg: No edema.     Left lower leg: No edema.  Lymphadenopathy:     Cervical: No cervical adenopathy.  Skin:    Findings: No rash.  Neurological:     Mental Status: She is alert and oriented to person, place, and time.     Cranial Nerves: No cranial nerve deficit.  Psychiatric:        Behavior: Behavior normal.        Thought Content: Thought content normal.        Judgment: Judgment normal.    BP 134/60 (BP Location: Left Arm, Patient Position: Sitting, Cuff Size: Normal)   Pulse 74   Temp 98 F (36.7 C) (Oral)   Ht '5\' 3"'$  (1.6 m)   Wt 159 lb 4.8 oz (72.3 kg)   SpO2 98%   BMI 28.22 kg/m  Wt Readings from Last 3 Encounters:  10/11/20 159 lb 4.8 oz (72.3 kg)  07/15/20 159 lb (72.1 kg)  06/01/20 160 lb 1.6 oz (72.6 kg)     Health Maintenance Due  Topic Date Due   COVID-19 Vaccine (4 - Booster for Moderna series) 07/13/2020   INFLUENZA VACCINE  09/26/2020    There are no preventive care reminders to display for this patient.  Lab Results   Component Value Date   TSH 1.34 01/15/2017   Lab Results  Component Value Date   WBC 8.1 01/15/2017   HGB 13.6 01/15/2017   HCT 40.2 01/15/2017   MCV 93.5 01/15/2017   PLT 175.0 01/15/2017   Lab Results  Component Value Date   NA 132 (L) 11/25/2019   K 4.7 11/25/2019   CO2 29 11/25/2019   GLUCOSE 105 (H) 11/25/2019   BUN 16 11/25/2019   CREATININE 0.70 11/25/2019   BILITOT 0.6 11/25/2019   ALKPHOS 86 01/17/2018   AST 25 11/25/2019   ALT 29 11/25/2019   PROT 7.2 11/25/2019   ALBUMIN 4.7 01/17/2018   CALCIUM 10.2 11/25/2019   GFR 100.91 01/17/2018   Lab Results  Component Value Date   CHOL 152 11/25/2019   Lab Results  Component Value Date   HDL 57 11/25/2019   Lab Results  Component Value Date   LDLCALC 72 11/25/2019   Lab Results  Component Value Date   TRIG 155 (H) 11/25/2019   Lab Results  Component Value Date   CHOLHDL 2.7 11/25/2019   No results found for: HGBA1C    Assessment & Plan:   Problem List Items Addressed This Visit   None Visit Diagnoses     Physical exam    -  Primary   Relevant Orders   Basic metabolic panel   Lipid panel   Hepatic function panel   VITAMIN D 25 Hydroxy (Vit-D Deficiency, Fractures)     79 year old female with history of hypertension which is fairly well controlled and hyperlipidemia.  She does have osteoporosis  treated with Prolia injections every 6 months.  -Obtain labs as above -Recommend annual flu vaccine -Discussed importance of continuing regular weightbearing exercise and daily calcium and vitamin D -Other vaccines up-to-date -She plans to set up yearly mammogram -Aged out of colonoscopies.  No orders of the defined types were placed in this encounter.   Follow-up: No follow-ups on file.    Carolann Littler, MD

## 2020-10-29 ENCOUNTER — Other Ambulatory Visit: Payer: Self-pay | Admitting: Family Medicine

## 2020-11-01 ENCOUNTER — Other Ambulatory Visit: Payer: Self-pay

## 2020-11-01 ENCOUNTER — Ambulatory Visit (INDEPENDENT_AMBULATORY_CARE_PROVIDER_SITE_OTHER): Payer: Medicare PPO

## 2020-11-01 DIAGNOSIS — H353132 Nonexudative age-related macular degeneration, bilateral, intermediate dry stage: Secondary | ICD-10-CM | POA: Diagnosis not present

## 2020-11-01 DIAGNOSIS — M810 Age-related osteoporosis without current pathological fracture: Secondary | ICD-10-CM | POA: Diagnosis not present

## 2020-11-01 DIAGNOSIS — Z961 Presence of intraocular lens: Secondary | ICD-10-CM | POA: Diagnosis not present

## 2020-11-01 DIAGNOSIS — H04123 Dry eye syndrome of bilateral lacrimal glands: Secondary | ICD-10-CM | POA: Diagnosis not present

## 2020-11-01 MED ORDER — DENOSUMAB 60 MG/ML ~~LOC~~ SOSY
60.0000 mg | PREFILLED_SYRINGE | Freq: Once | SUBCUTANEOUS | Status: AC
  Administered 2020-11-01: 60 mg via SUBCUTANEOUS

## 2020-11-01 NOTE — Telephone Encounter (Signed)
I tried to contact patient again via her cell phone number and home number. Unable to leave a message at either number. It is okay to schedule patient's Prolia injection any time - estimated cost is $0.

## 2020-11-01 NOTE — Progress Notes (Signed)
Per orders from Dr Elease Hashimoto, pt was given Prolia injection by Madaline Guthrie, pt tolerated well.

## 2020-11-15 ENCOUNTER — Other Ambulatory Visit: Payer: Self-pay

## 2020-11-15 ENCOUNTER — Ambulatory Visit: Payer: Medicare PPO | Admitting: Family Medicine

## 2020-11-15 ENCOUNTER — Ambulatory Visit: Payer: Self-pay

## 2020-11-15 DIAGNOSIS — M25512 Pain in left shoulder: Secondary | ICD-10-CM | POA: Diagnosis not present

## 2020-11-15 DIAGNOSIS — M25511 Pain in right shoulder: Secondary | ICD-10-CM | POA: Diagnosis not present

## 2020-11-15 DIAGNOSIS — M12811 Other specific arthropathies, not elsewhere classified, right shoulder: Secondary | ICD-10-CM

## 2020-11-15 DIAGNOSIS — M12812 Other specific arthropathies, not elsewhere classified, left shoulder: Secondary | ICD-10-CM | POA: Diagnosis not present

## 2020-11-15 NOTE — Patient Instructions (Addendum)
Thank you for coming in today.   Do the home exercises.   Please perform the exercise program that we have prepared for you and gone over in detail on a daily basis.  In addition to the handout you were provided you can access your program through: [email or web address]   Your unique program code is:   [email or web address]  Plan for physical therapy and home exercises.   I can do an injection if not better.

## 2020-11-15 NOTE — Progress Notes (Deleted)
   I, Peterson Lombard, LAT, ATC acting as a scribe for Lynne Leader, MD.  Subjective:    CC: L shoulder pain  HPI: Pt is an 80 y/o female c/o L shoulder pain. Pt was previously seen by Dr. Georgina Snell in 2020 for bilat knee pain. Today, pt reports L shoulder pain x /. Pt locates pain to /  Neck pain: Radiates: UE numbness/tinging: UE weakness: Aggravates: Treatments tried:  Pertinent review of Systems: ***  Relevant historical information: ***   Objective:   There were no vitals filed for this visit. General: Well Developed, well nourished, and in no acute distress.   MSK: ***  Lab and Radiology Results No results found for this or any previous visit (from the past 72 hour(s)). No results found.    Impression and Recommendations:    Assessment and Plan: 80 y.o. female with ***.  PDMP not reviewed this encounter. No orders of the defined types were placed in this encounter.  No orders of the defined types were placed in this encounter.   Discussed warning signs or symptoms. Please see discharge instructions. Patient expresses understanding.   ***

## 2020-11-15 NOTE — Progress Notes (Signed)
   I, Peterson Lombard, LAT, ATC acting as a scribe for Lynne Leader, MD.  Subjective:    CC: L shoulder pain  HPI: Pt is an 80 y/o female c/o L shoulder pain. Pt was previously seen by Dr. Georgina Snell on 01/08/19 for bilat knee pain. Today, pt reports bilateral shoulder pain left worse than right.  She noted that her pain started about 4 days ago after she helped her husband.  Prune back a dead [place] tree.  She developed pain that evening and into the next day and the pain has been pretty severe at times worsening a bit today.  She is frustrated because she had plan to go to [facility] this weekend.  She has been to physical therapy before and [place] and is willing to go back if needed.    Neck pain: No Radiates: No UE Numbness/tingling: No UE Weakness: With overhead motion Aggravates: Sleeping overhead motion and reaching back Treatments tried:  Voltaren Gel 1%, Ibuprofen, Tylenol, Ice, Prayer.    Pertinent review of Systems: No fevers or chills  Relevant historical information: Hypertension   Objective:   Heart rate 80 General: Well Developed, well nourished, and in no acute distress.   MSK: C-spine normal-appearing normal cervical motion. Right shoulder normal-appearing normal motion Intact strength.   Mildly positive Hawkins and Neer's test negative empty can test Negative Yergason's and speeds test.  Left shoulder: Normal-appearing normal motion. Intact strength. Mildly positive Hawkins and Neer's test.  Negative empty can test. Negative Yergason's and speeds test.  Lab and Radiology Results  Diagnostic Limited MSK Ultrasound of: Bilateral shoulder Right shoulder: Biceps tendon intact normal-appearing Subscapularis tendon is intact  supraspinatus tendon is intact. Moderate subacromial bursitis present. Infraspinatus tendon is intact AC joint degenerative appearing  Left shoulder: Biceps tendon is intact and normal-appearing Subscapularis tendon is  intact. Supraspinatus tendon is intact. Moderate subacromial bursitis is present. Infraspinatus tendon is intact. AC joint degenerative appearing Impression: Subacromial bursitis bilaterally     Impression and Recommendations:    Assessment and Plan: 80 y.o. female with bilateral shoulder pain after increasing activity with some landscaping activity.  Pain is already improving.  Plan for home exercise program taught in clinic today and referral to PT.  If not improving certainly could return to clinic for injection.  Recheck back as needed.  Suspect that she will resolve pretty well with PT and home exercise program..  PDMP not reviewed this encounter. Orders Placed This Encounter  Procedures   [place] LIMITED JOINT SPACE STRUCTURES UP LEFT(NO LINKED CHARGES)    Order Specific Question:   Reason for Exam (SYMPTOM  OR DIAGNOSIS REQUIRED)    Answer:   L shoulder pain    Order Specific Question:   Preferred imaging location?    Answer:   [facility]   Ambulatory referral to Physical Therapy    Referral Priority:   Routine    Referral Type:   Physical Medicine    Referral Reason:   Specialty Services Required    Requested Specialty:   Physical Therapy    Number of Visits Requested:   1   No orders of the defined types were placed in this encounter.   Discussed warning signs or symptoms. Please see discharge instructions. Patient expresses understanding.   The above documentation has been reviewed and is accurate and complete Lynne Leader, M.D.

## 2020-11-22 ENCOUNTER — Other Ambulatory Visit: Payer: Self-pay | Admitting: Family Medicine

## 2020-12-13 ENCOUNTER — Other Ambulatory Visit: Payer: Self-pay | Admitting: Family Medicine

## 2021-03-15 ENCOUNTER — Other Ambulatory Visit: Payer: Self-pay | Admitting: Family Medicine

## 2021-04-05 ENCOUNTER — Telehealth: Payer: Self-pay | Admitting: Family Medicine

## 2021-04-05 ENCOUNTER — Telehealth (INDEPENDENT_AMBULATORY_CARE_PROVIDER_SITE_OTHER): Payer: Medicare PPO | Admitting: Family Medicine

## 2021-04-05 DIAGNOSIS — I10 Essential (primary) hypertension: Secondary | ICD-10-CM | POA: Diagnosis not present

## 2021-04-05 DIAGNOSIS — R051 Acute cough: Secondary | ICD-10-CM | POA: Diagnosis not present

## 2021-04-05 MED ORDER — ALBUTEROL SULFATE HFA 108 (90 BASE) MCG/ACT IN AERS: 2.0000 | INHALATION_SPRAY | RESPIRATORY_TRACT | 0 refills | Status: AC | PRN

## 2021-04-05 NOTE — Telephone Encounter (Signed)
Pt is taking robitusin dm and if md would like her to have something else for cough please  send to pharm also pt is requesting abx . Pt has virtual with dr Elease Hashimoto this morning [place], [place] Phone:  [phone]  Fax:  [phone]

## 2021-04-05 NOTE — Progress Notes (Signed)
Patient ID: Cassandra Foster, female   DOB: [birth year], 81 y.o.   MRN: [id number]  This visit type was conducted due to national recommendations for restrictions regarding the COVID-19 pandemic in an effort to limit this patient's exposure and mitigate transmission in our community.   Virtual Visit via Telephone Note  I connected with Cassandra Foster on 04/05/21 at 10:00 AM EST by telephone and verified that I am speaking with the correct person using two identifiers.   I discussed the limitations, risks, security and privacy concerns of performing an evaluation and management service by telephone and the availability of in person appointments. I also discussed with the patient that there may be a patient responsible charge related to this service. The patient expressed understanding and agreed to proceed.  Location patient: home Location provider: work or home office Participants present for the call: patient, provider Patient did not have a visit in the prior 7 days to address this/these issue(s).   History of Present Illness:  Patient relates symptoms of fatigue, weakness, cough, congestion.  Onset about 5 days ago.  No fever.  Cough is mostly dry.  She thinks she may have a little bit of wheezing intermittently but not sure.  She has used albuterol in the past but inhaler is out of date.  No sick contacts.  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  She denies any dyspnea.  She states she feels slightly better than she did couple days ago.  She did apparently have episode of blood pressure spike on Monday with home blood pressure 179/75 and had some transient chest pain.  Never went to the ER for evaluation.  Denies any chest pain at this time.  Blood pressure today 136/67  Past Medical History:  Diagnosis Date   Chronic insomnia    Colon polyps    Hyperlipidemia    Hypertension    Osteoporosis    Past Surgical  History:  Procedure Laterality Date   BREAST EXCISIONAL BIOPSY Left    benign, lump removed many years ago   [place]  multiple   KNEE SURGERY  2009   kneecap   OTHER SURGICAL HISTORY     childbirth x 3   TUBAL LIGATION     BP dropped     reports that she has never smoked. She has never used smokeless tobacco. She reports that she does not drink alcohol and does not use drugs. family history includes Breast cancer in her mother; Cancer in her mother; Colon cancer (age of onset: 68) in her brother; Colon cancer (age of onset: 68) in an other family member; Prostate cancer in her brother; Stroke in her father. Allergies  Allergen Reactions   Simvastatin Other (See Comments)    Body aches    Observations/Objective: Patient sounds cheerful and well on the phone. I do not appreciate any SOB. Speech and thought processing are grossly intact. Patient reported vitals:  Assessment and Plan:  Viral URI with cough.  Patient denies any fever.  We sent in refill of albuterol MDI to use every 4-6 hours as needed.  We recommended either office or ER follow-up for any fever, dyspnea, or any persistent symptoms or worsening symptoms.  We also strongly recommend that she go immediately to the ER for any recurrent chest symptoms.-Such as chest pain.  Continue monitoring blood pressure and be in  touch if consistently greater than 140/90  Follow Up Instructions:   99441 5-10 99442 11-20 99443 21-30 I did not refer this patient for an OV in the next 24 hours for this/these issue(s).  I discussed the assessment and treatment plan with the patient. The patient was provided an opportunity to ask questions and all were answered. The patient agreed with the plan and demonstrated an understanding of the instructions.   The patient was advised to call back or seek an in-person evaluation if the symptoms worsen or if the condition fails to improve as anticipated.  I  provided 17 minutes of non-face-to-face time during this encounter.   Carolann Littler, MD

## 2021-04-07 ENCOUNTER — Ambulatory Visit: Payer: Medicare PPO | Admitting: Adult Health

## 2021-04-07 ENCOUNTER — Telehealth: Payer: Self-pay | Admitting: Family Medicine

## 2021-04-07 VITALS — BP 128/70 | HR 94 | Temp 98.6°F | Ht 63.0 in | Wt 161.0 lb

## 2021-04-07 DIAGNOSIS — J069 Acute upper respiratory infection, unspecified: Secondary | ICD-10-CM

## 2021-04-07 MED ORDER — BENZONATATE 200 MG PO CAPS: 200.0000 mg | ORAL_CAPSULE | Freq: Two times a day (BID) | ORAL | 0 refills | Status: DC | PRN

## 2021-04-07 MED ORDER — PREDNISONE 10 MG PO TABS: 10.0000 mg | ORAL_TABLET | Freq: Every day | ORAL | 0 refills | Status: DC

## 2021-04-07 NOTE — Progress Notes (Signed)
Subjective:    Patient ID: Cassandra Foster, female    DOB: [birth year], 81 y.o.   MRN: [id number]  HPI 81 year old female who  has a past medical history of Chronic insomnia, Colon polyps, Hyperlipidemia, Hypertension, and Osteoporosis.  She is a patient of Dr. Elease Hashimoto who I am seeing today for an acute issue of a dry cough that started roughly 4 to 5 days ago.  She did have a virtual visit with her PCP 2 days ago at which time he sent in an albuterol inhaler but she has not noticed any improvement.  In the virtual visit she reported may be having a little bit of wheezing intermittently but was not sure.  Today she reports no wheezing.  He denies dyspnea, fevers, or chills.  She is feeling fatigued reports that this has been going on for multiple weeks to a couple months.  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  Tension to her PCP during the virtual visit that she had a single episode of elevated blood pressure and transient chest pain, this has not happened since then her blood pressure has been at baseline.  She does feel as though she may be improving slightly over the last day or 2.  Her cough is more so during the day, not having a cough at night.  Reports pain in her ribs and back from coughing so much.  Mom has been using Claritin-D without relief   Review of Systems See HPI   Past Medical History:  Diagnosis Date   Chronic insomnia    Colon polyps    Hyperlipidemia    Hypertension    Osteoporosis     Social History   Socioeconomic History   Marital status: Married    Spouse name: Not on file   Number of children: 3   Years of education: high school graduate   Highest education level: High school graduate  Occupational History   Occupation: retired  Tobacco Use   Smoking status: Never   Smokeless tobacco: Never  Scientific laboratory technician Use: Never  used  Substance and Sexual Activity   Alcohol use: No   Drug use: No   Sexual activity: Not on file  Other Topics Concern   Not on file  Social History Narrative   Retired   Married   3 sons   5 grand-children   Social Determinants of Health   Financial Resource Strain: Low Risk    Difficulty of Paying Living Expenses: Not hard at all  Food Insecurity: No Food Insecurity   Worried About Charity fundraiser in the Last Year: Never true   Arboriculturist in the Last Year: Never true  Transportation Needs: No Transportation Needs   Lack of Transportation (Medical): No   Lack of Transportation (Non-Medical): No  Physical Activity: Sufficiently Active   Days of Exercise per Week: 5 days   Minutes of Exercise per Session: 30 min  Stress: No Stress Concern Present   Feeling of Stress : Not at all  Social Connections: Moderately Integrated   Frequency of Communication with Friends and Family: More than three times a week   Frequency of Social Gatherings with Friends and Family: More than three times a week   Attends Religious Services: More than 4 times per year   Active Member of Clubs or Organizations: No  Attends Archivist Meetings: Never   Marital Status: Married  Human resources officer Violence: Not At Risk   Fear of Current or Ex-Partner: No   Emotionally Abused: No   Physically Abused: No   Sexually Abused: No    Past Surgical History:  Procedure Laterality Date   BREAST EXCISIONAL BIOPSY Left    benign, lump removed many years ago   [place]  multiple   KNEE SURGERY  2009   kneecap   OTHER SURGICAL HISTORY     childbirth x 3   TUBAL LIGATION     BP dropped     Family History  Problem Relation Age of Onset   Cancer Mother        breast   Breast cancer Mother    Stroke Father    Colon cancer Brother 60   Prostate cancer Brother    Colon cancer Other 61    Allergies  Allergen Reactions   Simvastatin Other (See  Comments)    Body aches    Current Outpatient Medications on File Prior to Visit  Medication Sig Dispense Refill   albuterol (VENTOLIN HFA) 108 (90 Base) MCG/ACT inhaler Inhale 2 puffs into the lungs every 4 (four) hours as needed for wheezing or shortness of breath. 1 each 0   amLODipine (NORVASC) 5 MG tablet TAKE 1 TABLET DAILY 90 tablet 0   aspirin EC 81 MG tablet 81 mg. Take 3 tabs daily     Cholecalciferol (VITAMIN D3) 5000 UNITS CAPS Take 1 capsule by mouth daily.     denosumab (PROLIA) 60 MG/ML SOSY injection Inject 60 mg into the skin every 6 (six) months.     diclofenac Sodium (VOLTAREN) 1 % GEL Apply 4 g topically 4 (four) times daily. To affected joint. 100 g 11   fish oil-omega-3 fatty acids 1000 MG capsule Take 2 g by mouth daily.     losartan-hydrochlorothiazide (HYZAAR) 100-12.5 MG tablet TAKE 1 TABLET DAILY 90 tablet 1   mometasone (ELOCON) 0.1 % lotion APPLY TOPICALLY ONCE A DAY AS DIRECTED 60 mL 1   Multiple Vitamins-Minerals (PRESERVISION AREDS 2 PO) Take by mouth daily.     pravastatin (PRAVACHOL) 20 MG tablet TAKE 1 TABLET DAILY 90 tablet 1   traZODone (DESYREL) 50 MG tablet TAKE TWO TABLETS BY MOUTH AT BEDTIME 180 tablet 1   No current facility-administered medications on file prior to visit.    BP 128/70    Pulse 94    Temp 98.6 F (37 C) (Oral)    Ht 5\' 3"  (1.6 m)    Wt 161 lb (73 kg)    SpO2 97%    BMI 28.52 kg/m       Objective:   Physical Exam Vitals and nursing note reviewed.  Constitutional:      Appearance: Normal appearance.  HENT:     Right Ear: Tympanic membrane, ear canal and external ear normal.     Left Ear: Tympanic membrane, ear canal and external ear normal.     Nose: Nose normal. No congestion or rhinorrhea.     Mouth/Throat:     Mouth: Mucous membranes are moist.     Pharynx: Oropharynx is clear.  Cardiovascular:     Rate and Rhythm: Normal rate and regular rhythm.     Pulses: Normal pulses.     Heart sounds: Normal heart sounds.   Pulmonary:     Effort: Pulmonary effort is normal. No  respiratory distress.     Breath sounds: Normal breath sounds. No stridor. No wheezing, rhonchi or rales.  Chest:     Chest wall: No tenderness.  Skin:    General: Skin is warm and dry.  Neurological:     General: No focal deficit present.     Mental Status: She is alert and oriented to person, place, and time.  Psychiatric:        Mood and Affect: Mood normal.        Behavior: Behavior normal.        Thought Content: Thought content normal.        Judgment: Judgment normal.      Assessment & Plan:  1. Viral URI with cough - No signs of bacterial infection  -We will treat with a short course of prednisone and Tessalon Perles.  Advised to use humidifier at home.  Follow-up in the office if no improvement in the next 3 to 4 days or sooner if fever develops - benzonatate (TESSALON) 200 MG capsule; Take 1 capsule (200 mg total) by mouth 2 (two) times daily as needed for cough.  Dispense: 20 capsule; Refill: 0 - predniSONE (DELTASONE) 10 MG tablet; Take 1 tablet (10 mg total) by mouth daily with breakfast.  Dispense: 7 tablet; Refill: 0  Dorothyann Peng, NP  Time spent on chart review, time with patient; discussion of  cough,  treatment, follow up plan, and documentation 30 minutes

## 2021-04-07 NOTE — Telephone Encounter (Signed)
Pt is on the schedule for today. 

## 2021-04-07 NOTE — Telephone Encounter (Signed)
Patient calling in with respiratory symptoms: Shortness of breath, chest pain, palpitations or other red words send to Triage  Does the patient have a fever over 100, cough, congestion, sore throat, runny nose, lost of taste/smell (please list symptoms that patient has)? coughing/congestion/ribs sore from coughing  What date did symptoms start? 6 days ago  (If over 5 days ago, pt may be scheduled for in person visit)  Have you tested for Covid in the last 5 days? No   If yes, was it positive []  OR negative [] ? If positive in the last 5 days, please schedule virtual visit now. If negative, schedule for an in person OV with the next available provider if PCP has no openings. Please also let patient know they will be tested again (follow the script below)  "you will have to arrive 27mins prior to your appt time to be Covid tested. Please park in back of office at the cone & call [phone] to let the staff know you have arrived. A staff member will meet you at your car to do a rapid covid test. Once the test has resulted you will be notified by phone of your results to determine if appt will remain an in person visit or be converted to a virtual/phone visit. If you arrive less than 32mins before your appt time, your visit will be automatically converted to virtual & any recommended testing will happen AFTER the visit."   [place]  If no availability for virtual visit in office,  please schedule another [place] office  If no availability at another [place] office, please instruct patient that they can schedule an evisit or virtual visit through their mychart account. Visits up to 8pm  patients can be seen in office 5 days after positive COVID test

## 2021-05-01 ENCOUNTER — Telehealth: Payer: Self-pay

## 2021-05-01 NOTE — Telephone Encounter (Signed)
I called and spoke with pt's husband. He will let pt know that she can schedule her Prolia injection. ? ?- estimated cost is $0. ?- okay to schedule at any time.  ?

## 2021-05-13 ENCOUNTER — Other Ambulatory Visit: Payer: Self-pay | Admitting: Family Medicine

## 2021-05-22 ENCOUNTER — Ambulatory Visit (INDEPENDENT_AMBULATORY_CARE_PROVIDER_SITE_OTHER): Payer: Medicare PPO

## 2021-05-22 DIAGNOSIS — M810 Age-related osteoporosis without current pathological fracture: Secondary | ICD-10-CM

## 2021-05-22 MED ORDER — DENOSUMAB 60 MG/ML ~~LOC~~ SOSY
60.0000 mg | PREFILLED_SYRINGE | Freq: Once | SUBCUTANEOUS | Status: AC
  Administered 2021-05-22: 60 mg via SUBCUTANEOUS

## 2021-05-22 NOTE — Progress Notes (Signed)
Pt here for prolia injection per Dr Elease Hashimoto. ?Prolia '60mg'$  given subcutaneous in left arm and pt tolerated injection well. ? ?Pt to schedule next Prolia injection before leaving. ? ?

## 2021-06-15 ENCOUNTER — Other Ambulatory Visit: Payer: Self-pay | Admitting: Family Medicine

## 2021-08-02 ENCOUNTER — Encounter (INDEPENDENT_AMBULATORY_CARE_PROVIDER_SITE_OTHER): Payer: Medicare PPO | Admitting: Ophthalmology

## 2021-08-25 ENCOUNTER — Encounter (INDEPENDENT_AMBULATORY_CARE_PROVIDER_SITE_OTHER): Payer: Medicare PPO | Admitting: Ophthalmology

## 2021-08-25 DIAGNOSIS — H35033 Hypertensive retinopathy, bilateral: Secondary | ICD-10-CM | POA: Diagnosis not present

## 2021-08-25 DIAGNOSIS — H353132 Nonexudative age-related macular degeneration, bilateral, intermediate dry stage: Secondary | ICD-10-CM | POA: Diagnosis not present

## 2021-08-25 DIAGNOSIS — D3132 Benign neoplasm of left choroid: Secondary | ICD-10-CM | POA: Diagnosis not present

## 2021-08-25 DIAGNOSIS — I10 Essential (primary) hypertension: Secondary | ICD-10-CM

## 2021-08-25 DIAGNOSIS — H43813 Vitreous degeneration, bilateral: Secondary | ICD-10-CM

## 2021-08-28 ENCOUNTER — Ambulatory Visit: Payer: Medicare PPO | Admitting: Family Medicine

## 2021-08-28 ENCOUNTER — Encounter: Payer: Self-pay | Admitting: Family Medicine

## 2021-08-28 VITALS — BP 128/60 | HR 76 | Temp 98.2°F | Ht 63.0 in | Wt 159.2 lb

## 2021-08-28 DIAGNOSIS — R079 Chest pain, unspecified: Secondary | ICD-10-CM

## 2021-08-28 DIAGNOSIS — L282 Other prurigo: Secondary | ICD-10-CM | POA: Diagnosis not present

## 2021-08-28 MED ORDER — CLOBETASOL PROPIONATE EMULSION 0.05 % EX FOAM: 1.0000 | Freq: Two times a day (BID) | CUTANEOUS | 1 refills | Status: DC

## 2021-08-28 NOTE — Patient Instructions (Signed)
Leave off Neosporin and topical alcohol to scalp rash  Try medicated steroid foam twice daily as needed  Follow-up immediately for any recurrent chest pain or other concerns

## 2021-08-28 NOTE — Progress Notes (Unsigned)
Established Patient Office Visit  Subjective   Patient ID: Cassandra Foster, female    DOB: 06-04-1940  Age: 81 y.o. MRN: [id number]  Chief Complaint  Patient presents with   Chest Pain    Patient complains of one episode of chest pain 2 months ago, states her blood pressure showed "fluttering" 1 week ago   Rash    Patient complains of itchy rash noted along the nape of her neck x2 weeks, worse when sweating or if she feels hot, tried soap and Neosporin with some relief    HPI  {History (Optional):23778} Patient relates episode of chest pain about 2 months ago.  Details are vague.  She recalls having transient pain at rest and wonders if this may have been " upset stomach" or GERD.  No recent exertional chest pain.  About a week ago she states she was emotionally upset and had some transient palpitations.  No syncope.  No associated chest pain at that time.  No recent diaphoresis, nausea, vomiting, left arm pain, or any chest pressure or dyspnea.  No cardiac history. She is non-smoker.  No history of diabetes.  Hypertension which has been controlled with amlodipine and losartan HCTZ.  She takes pravastatin for hyperlipidemia.  Last LDL cholesterol 60  Other issue is pruritic rash on her scalp.  She has had this for couple weeks at least.  No change of shampoos.  She tried applying Neosporin and alcohol without relief.  Has not noted any scaling.  Past Medical History:  Diagnosis Date   Chronic insomnia    Colon polyps    Hyperlipidemia    Hypertension    Osteoporosis    Past Surgical History:  Procedure Laterality Date   BREAST EXCISIONAL BIOPSY Left    benign, lump removed many years ago   [place]  multiple   KNEE SURGERY  2009   kneecap   OTHER SURGICAL HISTORY     childbirth x 3   TUBAL LIGATION     BP dropped     reports that she has never smoked. She has never used smokeless tobacco. She reports that she does not drink alcohol and  does not use drugs. family history includes Breast cancer in her mother; Cancer in her mother; Colon cancer (age of onset: 74) in her brother; Colon cancer (age of onset: 24) in an other family member; Prostate cancer in her brother; Stroke in her father. Allergies  Allergen Reactions   Simvastatin Other (See Comments)    Body aches    Review of Systems  Constitutional:  Negative for chills and fever.  Respiratory:  Negative for cough and shortness of breath.   Cardiovascular:  Negative for palpitations, leg swelling and PND.      Objective:     BP 128/60 (BP Location: Left Arm, Patient Position: Sitting, Cuff Size: Normal)   Pulse 76   Temp 98.2 F (36.8 C) (Oral)   Ht '5\' 3"'$  (1.6 m)   Wt 159 lb 3.2 oz (72.2 kg)   SpO2 97%   BMI 28.20 kg/m  {Vitals History (Optional):23777}  Physical Exam Constitutional:      Appearance: She is well-developed.  Eyes:     Pupils: Pupils are equal, round, and reactive to light.  Neck:     Thyroid: No thyromegaly.     Vascular: No JVD.  Cardiovascular:     Rate and Rhythm: Normal rate and regular rhythm.  Heart sounds:     No gallop.  Pulmonary:     Effort: Pulmonary effort is normal. No respiratory distress.     Breath sounds: Normal breath sounds. No wheezing or rales.  Musculoskeletal:     Cervical back: Neck supple.  Skin:    Comments: Scalp reveals some nonspecific patchy areas of erythema without scaling.  No pustules.  No vesicles.  Rash appears to be confined to the lower occipital area   Neurological:     Mental Status: She is alert.      No results found for any visits on 08/28/21.  {Labs (Optional):23779}  The ASCVD Risk score (Arnett DK, et al., 2019) failed to calculate for the following reasons:   The 2019 ASCVD risk score is only valid for ages 1 to 37    Assessment & Plan:   #1 transient atypical chest pain over 2 months ago.  She did have some palpitations about a week ago when she was emotionally  upset but no chest pain since initial episode.  She stays very active with things around the house and has not had any exertional chest pain whatsoever. -Check EKG -Follow-up immediately for any recurrent chest pain or exertional chest symptoms  #2 nonspecific pruritic rash around the nape of the neck and lower occipital area.  Consider trial of Olux -medicated foam apply twice daily as needed.  Leave off alcohol and Neosporin   No follow-ups on file.    Carolann Littler, MD

## 2021-09-20 ENCOUNTER — Other Ambulatory Visit: Payer: Self-pay | Admitting: Family Medicine

## 2021-10-16 ENCOUNTER — Ambulatory Visit (INDEPENDENT_AMBULATORY_CARE_PROVIDER_SITE_OTHER): Payer: Medicare PPO

## 2021-10-16 VITALS — Ht 63.0 in | Wt 159.0 lb

## 2021-10-16 DIAGNOSIS — Z0000 Encounter for general adult medical examination without abnormal findings: Secondary | ICD-10-CM | POA: Diagnosis not present

## 2021-10-16 NOTE — Patient Instructions (Addendum)
[name] , Thank you for taking time to come for your Medicare Wellness Visit. I appreciate your ongoing commitment to your health goals. Please review the following plan we discussed and let me know if I can assist you in the future.   These are the goals we discussed:  Goals       Cut out extra servings      Cut out extra servings      Patient Stated      Stay healthy      Stay Healthy (pt-stated)      Continue to take care of my family.        This is a list of the screening recommended for you and due dates:  Health Maintenance  Topic Date Due   Flu Shot  09/26/2021   COVID-19 Vaccine (4 - Moderna series) 11/01/2021*   Tetanus Vaccine  01/18/2028   Pneumonia Vaccine  Completed   DEXA scan (bone density measurement)  Completed   Zoster (Shingles) Vaccine  Completed   HPV Vaccine  Aged Out   Colon Cancer Screening  Discontinued  *Topic was postponed. The date shown is not the original due date.    Advanced directives: Yes  Conditions/risks identified: None  Next appointment: Follow up in one year for your annual wellness visit     Preventive Care 65 Years and Older, Female Preventive care refers to lifestyle choices and visits with your health care provider that can promote health and wellness. What does preventive care include? A yearly physical exam. This is also called an annual well check. Dental exams once or twice a year. Routine eye exams. Ask your health care provider how often you should have your eyes checked. Personal lifestyle choices, including: Daily care of your teeth and gums. Regular physical activity. Eating a healthy diet. Avoiding tobacco and drug use. Limiting alcohol use. Practicing safe sex. Taking low-dose aspirin every day. Taking vitamin and mineral supplements as recommended by your health care provider. What happens during an annual well check? The services and screenings done by your health care provider during your annual  well check will depend on your age, overall health, lifestyle risk factors, and family history of disease. Counseling  Your health care provider may ask you questions about your: Alcohol use. Tobacco use. Drug use. Emotional well-being. Home and relationship well-being. Sexual activity. Eating habits. History of falls. Memory and ability to understand (cognition). Work and work Statistician. Reproductive health. Screening  You may have the following tests or measurements: Height, weight, and BMI. Blood pressure. Lipid and cholesterol levels. These may be checked every 5 years, or more frequently if you are over 72 years old. Skin check. Lung cancer screening. You may have this screening every year starting at age 37 if you have a 30-pack-year history of smoking and currently smoke or have quit within the past 15 years. Fecal occult blood test (FOBT) of the stool. You may have this test every year starting at age 53. Flexible sigmoidoscopy or colonoscopy. You may have a sigmoidoscopy every 5 years or a colonoscopy every 10 years starting at age 85. Hepatitis C blood test. Hepatitis B blood test. Sexually transmitted disease (STD) testing. Diabetes screening. This is done by checking your blood sugar (glucose) after you have not eaten for a while (fasting). You may have this done every 1-3 years. Bone density scan. This is done to screen for osteoporosis. You may have this done starting at age 30. Mammogram. This may be  done every 1-2 years. Talk to your health care provider about how often you should have regular mammograms. Talk with your health care provider about your test results, treatment options, and if necessary, the need for more tests. Vaccines  Your health care provider may recommend certain vaccines, such as: Influenza vaccine. This is recommended every year. Tetanus, diphtheria, and acellular pertussis (Tdap, Td) vaccine. You may need a Td booster every 10 years. Zoster  vaccine. You may need this after age 88. Pneumococcal 13-valent conjugate (PCV13) vaccine. One dose is recommended after age 23. Pneumococcal polysaccharide (PPSV23) vaccine. One dose is recommended after age 37. Talk to your health care provider about which screenings and vaccines you need and how often you need them. This information is not intended to replace advice given to you by your health care provider. Make sure you discuss any questions you have with your health care provider. Document Released: 03/11/2015 Document Revised: 11/02/2015 Document Reviewed: 12/14/2014 Elsevier Interactive Patient Education  2017 [employer] Prevention in the Home Falls can cause injuries. They can happen to people of all ages. There are many things you can do to make your home safe and to help prevent falls. What can I do on the outside of my home? Regularly fix the edges of walkways and driveways and fix any cracks. Remove anything that might make you trip as you walk through a door, such as a raised step or threshold. Trim any bushes or trees on the path to your home. Use bright outdoor lighting. Clear any walking paths of anything that might make someone trip, such as rocks or tools. Regularly check to see if handrails are loose or broken. Make sure that both sides of any steps have handrails. Any raised decks and porches should have guardrails on the edges. Have any leaves, snow, or ice cleared regularly. Use sand or salt on walking paths during winter. Clean up any spills in your garage right away. This includes oil or grease spills. What can I do in the bathroom? Use night lights. Install grab bars by the toilet and in the tub and shower. Do not use towel bars as grab bars. Use non-skid mats or decals in the tub or shower. If you need to sit down in the shower, use a plastic, non-slip stool. Keep the floor dry. Clean up any water that spills on the floor as soon as it happens. Remove  soap buildup in the tub or shower regularly. Attach bath mats securely with double-sided non-slip rug tape. Do not have throw rugs and other things on the floor that can make you trip. What can I do in the bedroom? Use night lights. Make sure that you have a light by your bed that is easy to reach. Do not use any sheets or blankets that are too big for your bed. They should not hang down onto the floor. Have a firm chair that has side arms. You can use this for support while you get dressed. Do not have throw rugs and other things on the floor that can make you trip. What can I do in the kitchen? Clean up any spills right away. Avoid walking on wet floors. Keep items that you use a lot in easy-to-reach places. If you need to reach something above you, use a strong step stool that has a grab bar. Keep electrical cords out of the way. Do not use floor polish or wax that makes floors slippery. If you must use wax,  use non-skid floor wax. Do not have throw rugs and other things on the floor that can make you trip. What can I do with my stairs? Do not leave any items on the stairs. Make sure that there are handrails on both sides of the stairs and use them. Fix handrails that are broken or loose. Make sure that handrails are as long as the stairways. Check any carpeting to make sure that it is firmly attached to the stairs. Fix any carpet that is loose or worn. Avoid having throw rugs at the top or bottom of the stairs. If you do have throw rugs, attach them to the floor with carpet tape. Make sure that you have a light switch at the top of the stairs and the bottom of the stairs. If you do not have them, ask someone to add them for you. What else can I do to help prevent falls? Wear shoes that: Do not have high heels. Have rubber bottoms. Are comfortable and fit you well. Are closed at the toe. Do not wear sandals. If you use a stepladder: Make sure that it is fully opened. Do not climb a  closed stepladder. Make sure that both sides of the stepladder are locked into place. Ask someone to hold it for you, if possible. Clearly mark and make sure that you can see: Any grab bars or handrails. First and last steps. Where the edge of each step is. Use tools that help you move around (mobility aids) if they are needed. These include: Canes. Walkers. Scooters. Crutches. Turn on the lights when you go into a dark area. Replace any light bulbs as soon as they burn out. Set up your furniture so you have a clear path. Avoid moving your furniture around. If any of your floors are uneven, fix them. If there are any pets around you, be aware of where they are. Review your medicines with your doctor. Some medicines can make you feel dizzy. This can increase your chance of falling. Ask your doctor what other things that you can do to help prevent falls. This information is not intended to replace advice given to you by your health care provider. Make sure you discuss any questions you have with your health care provider. Document Released: 12/09/2008 Document Revised: 07/21/2015 Document Reviewed: 03/19/2014 Elsevier Interactive Patient Education  2017 [employer].

## 2021-10-16 NOTE — Progress Notes (Signed)
Subjective:   Cassandra Foster is a 81 y.o. female who presents for Medicare Annual (Subsequent) preventive examination.  Review of Systems    Virtual Visit via Telephone Note  I connected with  Cassandra Foster on 10/16/21 at 10:00 AM EDT by telephone and verified that I am speaking with the correct person using two identifiers.  Location: Patient: Home Provider: Office Persons participating in the virtual visit: patient/Nurse Health Advisor   I discussed the limitations, risks, security and privacy concerns of performing an evaluation and management service by telephone and the availability of in person appointments. The patient expressed understanding and agreed to proceed.  Interactive audio and video telecommunications were attempted between this nurse and patient, however failed, due to patient having technical difficulties OR patient did not have access to video capability.  We continued and completed visit with audio only.  Some vital signs may be absent or patient reported.   Cassandra Peaches, LPN  Cardiac Risk Factors include: advanced age (>1mn, >>26women);hypertension     Objective:    Today's Vitals   10/16/21 1014  Weight: 159 lb (72.1 kg)  Height: '5\' 3"'$  (1.6 m)   Body mass index is 28.17 kg/m.     10/16/2021   10:25 AM 10/11/2020    2:05 PM 02/10/2019    1:34 PM 03/09/2014   10:56 AM 02/24/2014   11:07 AM  Advanced Directives  Does Patient Have a Medical Advance Directive? Yes Yes No Yes Yes  Type of [place] will   Healthcare Power of [place] will  Does patient want to make changes to medical advance directive? No - Patient declined Yes (MAU/Ambulatory/Procedural Areas - Information given)     Copy of [place] Chart? No - copy requested      Would patient like information on creating a medical advance directive?   Yes (MAU/Ambulatory/Procedural Areas  - Information given)      Current Medications (verified) Outpatient Encounter Medications as of 10/16/2021  Medication Sig   albuterol (VENTOLIN HFA) 108 (90 Base) MCG/ACT inhaler Inhale 2 puffs into the lungs every 4 (four) hours as needed for wheezing or shortness of breath.   amLODipine (NORVASC) 5 MG tablet TAKE 1 TABLET DAILY   aspirin EC 81 MG tablet Take 81 mg by mouth daily.   Cholecalciferol (VITAMIN D3) 5000 UNITS CAPS Take 1 capsule by mouth daily.   Clobetasol Propionate Emulsion (OLUX-E) 0.05 % topical foam Apply 1 Application topically 2 (two) times daily.   denosumab (PROLIA) 60 MG/ML SOSY injection Inject 60 mg into the skin every 6 (six) months.   diclofenac Sodium (VOLTAREN) 1 % GEL Apply 4 g topically 4 (four) times daily. To affected joint.   losartan-hydrochlorothiazide (HYZAAR) 100-12.5 MG tablet TAKE 1 TABLET DAILY   mometasone (ELOCON) 0.1 % lotion APPLY TOPICALLY ONCE A DAY AS DIRECTED   Multiple Vitamins-Minerals (PRESERVISION AREDS 2 PO) Take by mouth daily.   pravastatin (PRAVACHOL) 20 MG tablet TAKE 1 TABLET DAILY   traZODone (DESYREL) 50 MG tablet TAKE TWO TABLETS BY MOUTH AT BEDTIME   No facility-administered encounter medications on file as of 10/16/2021.    Allergies (verified) Simvastatin   History: Past Medical History:  Diagnosis Date   Chronic insomnia    Colon polyps    Hyperlipidemia    Hypertension    Osteoporosis    Past Surgical History:  Procedure Laterality Date   BREAST EXCISIONAL  BIOPSY Left    benign, lump removed many years ago   [place]  multiple   KNEE SURGERY  2009   kneecap   OTHER SURGICAL HISTORY     childbirth x 3   TUBAL LIGATION     BP dropped    Family History  Problem Relation Age of Onset   Cancer Mother        breast   Breast cancer Mother    Stroke Father    Colon cancer Brother 39   Prostate cancer Brother    Colon cancer Other 56   Social History    Socioeconomic History   Marital status: Married    Spouse name: Not on file   Number of children: 3   Years of education: high school graduate   Highest education level: High school graduate  Occupational History   Occupation: retired  Tobacco Use   Smoking status: Never   Smokeless tobacco: Never  Scientific laboratory technician Use: Never used  Substance and Sexual Activity   Alcohol use: No   Drug use: No   Sexual activity: Not on file  Other Topics Concern   Not on file  Social History Narrative   Retired   Married   3 sons   5 grand-children   Social Determinants of Health   Financial Resource Strain: Low Risk  (10/16/2021)   Overall Financial Resource Strain (CARDIA)    Difficulty of Paying Living Expenses: Not hard at all  Food Insecurity: No Cassandra Foster (10/16/2021)   Hunger Vital Sign    Worried About Running Out of Food in the Last Year: Never true    [name] in the Last Year: Never true  Transportation Needs: No Transportation Needs (10/16/2021)   PRAPARE - Hydrologist (Medical): No    Lack of Transportation (Non-Medical): No  Physical Activity: Sufficiently Active (10/16/2021)   Exercise Vital Sign    Days of Exercise per Week: 7 days    Minutes of Exercise per Session: 30 min  Stress: No Stress Concern Present (10/16/2021)   [place]    Feeling of Stress : Not at all  Social Connections: [place] (10/16/2021)   Social Connection and Isolation Panel [NHANES]    Frequency of Communication with Friends and Family: More than three times a week    Frequency of Social Gatherings with Friends and Family: More than three times a week    Attends Religious Services: More than 4 times per year    Active Member of [employer] or Organizations: Yes    Attends Music therapist: More than 4 times per year    Marital Status: Married    Tobacco  Counseling Counseling given: Not Answered   Clinical Intake:  Pre-visit preparation completed: No  Pain : No/denies pain     BMI - recorded: 28.21 Nutritional Status: BMI 25 -29 Overweight Nutritional Risks: None Diabetes: No  How often do you need to have someone help you when you read instructions, pamphlets, or other written materials from your doctor or pharmacy?: 1 - Never  Diabetic?  No  Interpreter Needed?: No  Information entered by :: Rolene Arbour LPN   Activities of Daily Living    10/16/2021   10:23 AM  In your present state of health, do you have any difficulty performing the following activities:  Hearing?  0  Vision? 0  Difficulty concentrating or making decisions? 0  Walking or climbing stairs? 0  Dressing or bathing? 0  Doing errands, shopping? 0  Preparing Food and eating ? N  Using the Toilet? N  In the past six months, have you accidently leaked urine? N  Do you have problems with loss of bowel control? N  Managing your Medications? N  Managing your Finances? N  Housekeeping or managing your Housekeeping? N    Patient Care Team: Eulas Post, MD as PCP - General (Family Medicine)  Indicate any recent Medical Services you may have received from other than [facility] providers in the past year (date may be approximate).     Assessment:   This is a routine wellness examination for Marya.  Hearing/Vision screen Hearing Screening - Comments:: No hearing difficulty Vision Screening - Comments:: Wears reading glasses. Dr Kathlen Mody  Dietary issues and exercise activities discussed: Exercise limited by: None identified   Goals Addressed               This Visit's Progress     Stay Healthy (pt-stated)        Continue to take care of my family.       Depression Screen    10/16/2021   10:21 AM 08/28/2021    1:50 PM 10/11/2020    1:56 PM 07/15/2020   11:20 AM 02/10/2019    1:35 PM 01/07/2017    9:37 AM 09/26/2015   11:51 AM  PHQ 2/9  Scores  PHQ - 2 Score 0 0 0 0 0 0 0  PHQ- 9 Score 0 0  0       Fall Risk    10/16/2021   10:23 AM 10/11/2020    2:05 PM 07/15/2020   11:19 AM 02/10/2019    1:35 PM 10/08/2017    1:21 PM  [place] in the past year? 0 0 0 0 Yes  Number falls in past yr: 0 0  0 1  Injury with Fall? 0 0     Risk for fall due to : No Fall Risks   Medication side effect;Orthopedic patient   Follow up  Falls prevention discussed  Falls evaluation completed;Education provided;Falls prevention discussed     FALL RISK PREVENTION PERTAINING TO THE HOME:  Any stairs in or around the home? No  If so, are there any without handrails? No  Home free of loose throw rugs in walkways, pet beds, electrical cords, etc? Yes  Adequate lighting in your home to reduce risk of falls? Yes   ASSISTIVE DEVICES UTILIZED TO PREVENT FALLS:  Life alert? No  Use of a cane, walker or w/c? No  Grab bars in the bathroom? No  Shower chair or bench in shower? Yes  Elevated toilet seat or a handicapped toilet? Yes   TIMED UP AND GO:  Was the test performed? No . Audio Visit  Cognitive Function:        10/16/2021   10:25 AM 02/10/2019    1:36 PM  6CIT Screen  What Year? 0 points 0 points  What month? 0 points 0 points  What time? 0 points 0 points  Count back from 20 0 points 0 points  Months in reverse 0 points 0 points  Repeat phrase 0 points 0 points  Total Score 0 points 0 points    Immunizations Immunization History  Administered Date(s) Administered   Fluad Quad(high Dose 65+) 11/13/2018, 11/25/2019, 02/21/2021   Influenza  Split 11/30/2010, 01/10/2012   Influenza, High Dose Seasonal PF 12/19/2012, 12/30/2013, 01/15/2017   Influenza,inj,Quad PF,6+ Mos 12/05/2017   Influenza-Unspecified 03/14/2015, 12/05/2017   Moderna Sars-Covid-2 Vaccination 04/07/2019, 05/05/2019, 03/15/2020   Pneumococcal Conjugate-13 07/30/2013   Pneumococcal Polysaccharide-23 09/26/2015   Td 01/17/2018   Tdap 04/26/2007    Zoster Recombinat (Shingrix) 08/05/2017, 10/12/2017    TDAP status: Up to date  Flu Vaccine status: Up to date  Pneumococcal vaccine status: Up to date  Covid-19 vaccine status: Completed vaccines  Qualifies for Shingles Vaccine? Yes   Zostavax completed Yes   Shingrix Completed?: Yes  Screening Tests Health Maintenance  Topic Date Due   INFLUENZA VACCINE  09/26/2021   COVID-19 Vaccine (4 - Moderna series) 11/01/2021 (Originally 05/10/2020)   TETANUS/TDAP  01/18/2028   Pneumonia Vaccine 79+ Years old  Completed   DEXA SCAN  Completed   Zoster Vaccines- Shingrix  Completed   HPV VACCINES  Aged Out   COLONOSCOPY (Pts 45-55yr Insurance coverage will need to be confirmed)  Discontinued    Health Maintenance  Health Maintenance Due  Topic Date Due   INFLUENZA VACCINE  09/26/2021    Colorectal cancer screening: Type of screening: Colonoscopy. Completed 03/09/14. Repeat every   years  Mammogram status: No longer required due to Age.  Bone Density status: Completed 05/26/19. Results reflect: Bone density results: OSTEOPOROSIS. Repeat every   years.  Lung Cancer Screening: (Low Dose CT Chest recommended if Age 81-80years, 30 pack-year currently smoking OR have quit w/in 15years.) does not qualify.     Additional Screening:  Hepatitis C Screening: does not qualify; Completed   Vision Screening: Recommended annual ophthalmology exams for early detection of glaucoma and other disorders of the eye. Is the patient up to date with their annual eye exam?  Yes  Who is the provider or what is the name of the office in which the patient attends annual eye exams? Dr WKathlen ModyIf pt is not established with a provider, would they like to be referred to a provider to establish care? No .   Dental Screening: Recommended annual dental exams for proper oral hygiene  Community Resource Referral / Chronic Care Management:  CRR required this visit?  No   CCM required this visit?  No       Plan:     I have personally reviewed and noted the following in the patient's chart:   Medical and social history Use of alcohol, tobacco or illicit drugs  Current medications and supplements including opioid prescriptions. Patient is not currently taking opioid prescriptions. Functional ability and status Nutritional status Physical activity Advanced directives List of other physicians Hospitalizations, surgeries, and ER visits in previous 12 months Vitals Screenings to include cognitive, depression, and falls Referrals and appointments  In addition, I have reviewed and discussed with patient certain preventive protocols, quality metrics, and best practice recommendations. A written personalized care plan for preventive services as well as general preventive health recommendations were provided to patient.     BCriselda Peaches LPN   [id number]  Nurse Notes: None

## 2021-10-17 ENCOUNTER — Ambulatory Visit (INDEPENDENT_AMBULATORY_CARE_PROVIDER_SITE_OTHER): Payer: Medicare PPO | Admitting: Family Medicine

## 2021-10-17 ENCOUNTER — Encounter: Payer: Self-pay | Admitting: Family Medicine

## 2021-10-17 VITALS — BP 138/68 | HR 75 | Temp 97.9°F | Ht 62.6 in | Wt 157.5 lb

## 2021-10-17 DIAGNOSIS — I10 Essential (primary) hypertension: Secondary | ICD-10-CM | POA: Diagnosis not present

## 2021-10-17 DIAGNOSIS — Z0000 Encounter for general adult medical examination without abnormal findings: Secondary | ICD-10-CM

## 2021-10-17 DIAGNOSIS — E785 Hyperlipidemia, unspecified: Secondary | ICD-10-CM | POA: Diagnosis not present

## 2021-10-17 LAB — HEPATIC FUNCTION PANEL
ALT: 31 U/L (ref 0–35)
AST: 26 U/L (ref 0–37)
Albumin: 4.6 g/dL (ref 3.5–5.2)
Alkaline Phosphatase: 82 U/L (ref 39–117)
Bilirubin, Direct: 0.1 mg/dL (ref 0.0–0.3)
Total Bilirubin: 0.7 mg/dL (ref 0.2–1.2)
Total Protein: 7.4 g/dL (ref 6.0–8.3)

## 2021-10-17 LAB — BASIC METABOLIC PANEL
BUN: 12 mg/dL (ref 6–23)
CO2: 22 mEq/L (ref 19–32)
Calcium: 10.2 mg/dL (ref 8.4–10.5)
Chloride: 96 mEq/L (ref 96–112)
Creatinine, Ser: 0.65 mg/dL (ref 0.40–1.20)
GFR: 82.58 mL/min (ref >60.00)
Glucose, Bld: 94 mg/dL (ref 70–99)
Potassium: 3.7 mEq/L (ref 3.5–5.1)
Sodium: 131 mEq/L — ABNORMAL LOW (ref 135–145)

## 2021-10-17 LAB — CBC WITH DIFFERENTIAL/PLATELET
Basophils Absolute: 0.1 10*3/uL (ref 0.0–0.1)
Basophils Relative: 0.8 % (ref 0.0–3.0)
Eosinophils Absolute: 0.2 10*3/uL (ref 0.0–0.7)
Eosinophils Relative: 2 % (ref 0.0–5.0)
HCT: 40.7 % (ref 36.0–46.0)
Hemoglobin: 13.8 g/dL (ref 12.0–15.0)
Lymphocytes Relative: 34.5 % (ref 12.0–46.0)
Lymphs Abs: 2.8 10*3/uL (ref 0.7–4.0)
MCHC: 33.8 g/dL (ref 30.0–36.0)
MCV: 93 fl (ref 78.0–100.0)
Monocytes Absolute: 0.8 10*3/uL (ref 0.1–1.0)
Monocytes Relative: 9.9 % (ref 3.0–12.0)
Neutro Abs: 4.3 10*3/uL (ref 1.4–7.7)
Neutrophils Relative %: 52.8 % (ref 43.0–77.0)
Platelets: 158 10*3/uL (ref 150.0–400.0)
RBC: 4.38 Mil/uL (ref 3.87–5.11)
RDW: 13.3 % (ref 11.5–15.5)
WBC: 8.1 10*3/uL (ref 4.0–10.5)

## 2021-10-17 LAB — TSH: TSH: 1.57 u[IU]/mL (ref 0.35–5.50)

## 2021-10-17 LAB — LIPID PANEL
Cholesterol: 154 mg/dL (ref 0–200)
HDL: 57.3 mg/dL (ref >39.00)
LDL Cholesterol: 65 mg/dL (ref 0–99)
NonHDL: 96.31
Total CHOL/HDL Ratio: 3
Triglycerides: 159 mg/dL — ABNORMAL HIGH (ref 0.0–149.0)
VLDL: 31.8 mg/dL (ref 0.0–40.0)

## 2021-10-17 NOTE — Progress Notes (Signed)
Established Patient Office Visit  Subjective   Patient ID: Cassandra Foster, female    DOB: 1940/11/13  Age: 81 y.o. MRN: [id number]  Chief Complaint  Patient presents with   Annual Exam    HPI   Cassandra Foster is here for complete physical.  She has seen GYN in the past but no longer.  She has history of hypertension, osteoporosis, GERD, hyperlipidemia.  She remains on medication for hypertension.  Generally doing well.  Her husband has some medical needs and she takes care of him and she also has a sister who moved here from [place] several years ago who has dementia and she is helping to care for her and has power of attorney for her.  Health maintenance reviewed  -Plans to get flu vaccine this fall -Has completed Shingrix vaccine and pneumonia vaccine -Aged out of further colonoscopy -Type is due 2029 -Last mammogram on record 3/21  Social history-married for 63 years.  She has a couple of sons.  Sister with dementia who currently lives with her.  No history of smoking.  No alcohol use.  Family history-mother had breast cancer.  Father had stroke.  She has 2 brothers 1 alive 1 deceased.  1 brother did have colon cancer.  Other brother had prostate cancer.  Past Medical History:  Diagnosis Date   Chronic insomnia    Colon polyps    Hyperlipidemia    Hypertension    Osteoporosis    Past Surgical History:  Procedure Laterality Date   BREAST EXCISIONAL BIOPSY Left    benign, lump removed many years ago   [place]  multiple   KNEE SURGERY  2009   kneecap   OTHER SURGICAL HISTORY     childbirth x 3   TUBAL LIGATION     BP dropped     reports that she has never smoked. She has never used smokeless tobacco. She reports that she does not drink alcohol and does not use drugs. family history includes Breast cancer in her mother; Cancer in her mother; Colon cancer (age of onset: 29) in her brother; Colon cancer (age of onset: 88) in an other family  member; Prostate cancer in her brother; Stroke in her father. Allergies  Allergen Reactions   Simvastatin Other (See Comments)    Body aches      Review of Systems  Constitutional:  Negative for chills, fever, malaise/fatigue and weight loss.  HENT:  Negative for hearing loss.   Eyes:  Negative for blurred vision and double vision.  Respiratory:  Negative for cough and shortness of breath.   Cardiovascular:  Negative for chest pain, palpitations and leg swelling.  Gastrointestinal:  Negative for abdominal pain, blood in stool, constipation and diarrhea.  Genitourinary:  Negative for dysuria.  Skin:  Negative for rash.  Neurological:  Negative for dizziness, speech change, seizures, loss of consciousness and headaches.  Psychiatric/Behavioral:  Negative for depression.       Objective:     BP 138/68 (BP Location: Left Arm, Patient Position: Sitting, Cuff Size: Normal)   Pulse 75   Temp 97.9 F (36.6 C) (Oral)   Ht 5' 2.6" (1.59 m)   Wt 157 lb 8 oz (71.4 kg)   SpO2 97%   BMI 28.26 kg/m    Physical Exam Constitutional:      Appearance: She is well-developed.  HENT:     Head: Normocephalic and atraumatic.  Eyes:  Pupils: Pupils are equal, round, and reactive to light.  Neck:     Thyroid: No thyromegaly.  Cardiovascular:     Rate and Rhythm: Normal rate and regular rhythm.     Heart sounds: Normal heart sounds. No murmur heard. Pulmonary:     Effort: No respiratory distress.     Breath sounds: Normal breath sounds. No wheezing or rales.     Comments: Breasts are symmetric with no masses.  No nipple inversion.  No skin dimpling. Abdominal:     General: Bowel sounds are normal. There is no distension.     Palpations: Abdomen is soft. There is no mass.     Tenderness: There is no abdominal tenderness. There is no guarding or rebound.  Musculoskeletal:        General: Normal range of motion.     Cervical back: Normal range of motion and neck supple.   Lymphadenopathy:     Cervical: No cervical adenopathy.  Skin:    Findings: No rash.  Neurological:     Mental Status: She is alert and oriented to person, place, and time.     Cranial Nerves: No cranial nerve deficit.     Deep Tendon Reflexes: Reflexes normal.  Psychiatric:        Behavior: Behavior normal.        Thought Content: Thought content normal.        Judgment: Judgment normal.      No results found for any visits on 10/17/21.    The ASCVD Risk score (Arnett DK, et al., 2019) failed to calculate for the following reasons:   The 2019 ASCVD risk score is only valid for ages 28 to 48    Assessment & Plan:   Problem List Items Addressed This Visit       Unprioritized   Hypertension   Relevant Orders   Basic metabolic panel   Hyperlipidemia - Primary   Relevant Orders   Lipid panel   Hepatic function panel   Other Visit Diagnoses     Physical exam       Relevant Orders   CBC with Differential/Platelet   TSH     -Obtain labs as above -Rec flu vaccine this fall -Set up repeat mammogram -Continue regular supplementation with vitamin D and calcium  No follow-ups on file.    Carolann Littler, MD

## 2021-10-17 NOTE — Patient Instructions (Signed)
Remember to get Flu vaccine this Fall  SET UP REPEAT MAMMOGRAM

## 2021-10-31 ENCOUNTER — Other Ambulatory Visit: Payer: Self-pay | Admitting: Family Medicine

## 2021-10-31 DIAGNOSIS — Z139 Encounter for screening, unspecified: Secondary | ICD-10-CM

## 2021-11-15 ENCOUNTER — Ambulatory Visit
Admission: RE | Admit: 2021-11-15 | Discharge: 2021-11-15 | Disposition: A | Discharge status: Home / Self Care | Payer: Medicare PPO | Source: Ambulatory Visit | Attending: Family Medicine | Admitting: Family Medicine

## 2021-11-15 DIAGNOSIS — Z139 Encounter for screening, unspecified: Secondary | ICD-10-CM

## 2021-11-15 DIAGNOSIS — Z1231 Encounter for screening mammogram for malignant neoplasm of breast: Secondary | ICD-10-CM | POA: Diagnosis not present

## 2021-11-20 ENCOUNTER — Other Ambulatory Visit: Payer: Self-pay | Admitting: Family Medicine

## 2021-11-28 ENCOUNTER — Ambulatory Visit: Payer: Medicare PPO

## 2021-12-05 ENCOUNTER — Ambulatory Visit (INDEPENDENT_AMBULATORY_CARE_PROVIDER_SITE_OTHER): Payer: Medicare PPO

## 2021-12-05 DIAGNOSIS — M810 Age-related osteoporosis without current pathological fracture: Secondary | ICD-10-CM | POA: Diagnosis not present

## 2021-12-05 MED ORDER — DENOSUMAB 60 MG/ML ~~LOC~~ SOSY
60.0000 mg | PREFILLED_SYRINGE | Freq: Once | SUBCUTANEOUS | Status: AC
  Administered 2021-12-05: 60 mg via SUBCUTANEOUS

## 2021-12-05 NOTE — Progress Notes (Signed)
Cassandra Foster is a 81 y.o. female presents to the office today for Prolia injection per Dr West Carbo orders. Original order: denosumab (PROLIA) injection 60 mg every 6 months Prolia '60mg'$  subcutaneous was administered left arm today. Patient tolerated injection. Patient due for follow up labs/provider appt: No.  Patient next injection due: 10/177/23, appt made Yes  Lucinda Dell

## 2021-12-18 DIAGNOSIS — H35372 Puckering of macula, left eye: Secondary | ICD-10-CM | POA: Diagnosis not present

## 2021-12-18 DIAGNOSIS — H353132 Nonexudative age-related macular degeneration, bilateral, intermediate dry stage: Secondary | ICD-10-CM | POA: Diagnosis not present

## 2021-12-18 DIAGNOSIS — H35033 Hypertensive retinopathy, bilateral: Secondary | ICD-10-CM | POA: Diagnosis not present

## 2021-12-18 DIAGNOSIS — H26492 Other secondary cataract, left eye: Secondary | ICD-10-CM | POA: Diagnosis not present

## 2021-12-19 ENCOUNTER — Other Ambulatory Visit: Payer: Self-pay | Admitting: Family Medicine

## 2022-02-22 ENCOUNTER — Other Ambulatory Visit: Payer: Self-pay | Admitting: Family Medicine

## 2022-02-22 ENCOUNTER — Telehealth: Payer: Self-pay | Admitting: Family Medicine

## 2022-02-22 NOTE — Telephone Encounter (Signed)
Pt called to say that either her son (that has PTSD) or her sister (that has dementia) may have stolen her medication, because they are no where to be found.  Pt is asking that MD resend refills for both the:  losartan-hydrochlorothiazide (HYZAAR) 100-12.5 MG tablet   And the  traZODone (DESYREL) 50 MG tablet  Pt informed that MD is OOO today, but will be in tomorrow.  LOV:  10/17/21  Please advise.  [place], [place] Phone: [phone]  Fax: [phone]

## 2022-02-23 NOTE — Telephone Encounter (Signed)
Rx's have been refilled.

## 2022-02-27 ENCOUNTER — Other Ambulatory Visit: Payer: Self-pay | Admitting: Family Medicine

## 2022-03-13 ENCOUNTER — Ambulatory Visit: Payer: Medicare PPO | Admitting: Family Medicine

## 2022-03-13 ENCOUNTER — Encounter: Payer: Self-pay | Admitting: Family Medicine

## 2022-03-13 VITALS — BP 144/62 | HR 85 | Temp 98.4°F | Ht 62.6 in | Wt 157.5 lb

## 2022-03-13 DIAGNOSIS — Z659 Problem related to unspecified psychosocial circumstances: Secondary | ICD-10-CM | POA: Diagnosis not present

## 2022-03-13 DIAGNOSIS — R202 Paresthesia of skin: Secondary | ICD-10-CM

## 2022-03-13 DIAGNOSIS — M25562 Pain in left knee: Secondary | ICD-10-CM

## 2022-03-13 DIAGNOSIS — G8929 Other chronic pain: Secondary | ICD-10-CM

## 2022-03-13 NOTE — Progress Notes (Signed)
Established Patient Office Visit  Subjective   Patient ID: Cassandra Foster, female    DOB: 16-Jun-1940  Age: 82 y.o. MRN: [id number]  Chief Complaint  Patient presents with   Knee Pain    Patient complains of left knee pain, x2 months, Patient reported she was lifting heavy objects when pain occurred   Tingling    Patient complains of tingling in right arm,     HPI   Cassandra Foster seen for the following items  Left knee pain.  She states around 4403 she had complicated injury with patella fracture that required surgery.  Recently, about a month ago, she was out in the yard lifting something heavy and felt some pain in the left knee.  She has somewhat of a sense of instability of the knee Since then.  No dislocation.  No warmth.  No erythema.  No ecchymosis.  Denies any recent fall.  She sometimes has sensation that her knee Is "falling down ".  Second complaint is some tingling sensation she has frequently on the right elbow.  Denies any injury.  No neck pain.  No radiculitis symptoms.  No upper extremity weakness.  No numbness involving the fingers.  She is also here to discuss increasing problems with her sister who lives with her and has so for several years now.  Her sister has Alzheimer's dementia which is progressing.  Her sister frequently gets agitated.  She is basically refusing help with things like medication oversight.  Cassandra Foster increasingly feels like this is difficult to manage.  She would like to consult with someone regarding potential options.  Her sister moved from [place] several years ago to be here.  Her sister no longer drives.  Her sister still obsesses about buying her own house and driving again.  Past Medical History:  Diagnosis Date   Chronic insomnia    Colon polyps    Hyperlipidemia    Hypertension    Osteoporosis    Past Surgical History:  Procedure Laterality Date   BREAST EXCISIONAL BIOPSY Left    benign, lump removed many years ago   [place]  multiple   KNEE SURGERY  2009   kneecap   OTHER SURGICAL HISTORY     childbirth x 3   TUBAL LIGATION     BP dropped     reports that she has never smoked. She has never used smokeless tobacco. She reports that she does not drink alcohol and does not use drugs. family history includes Breast cancer in her mother; Cancer in her mother; Colon cancer (age of onset: 41) in her brother; Colon cancer (age of onset: 57) in an other family member; Prostate cancer in her brother; Stroke in her father. Allergies  Allergen Reactions   Simvastatin Other (See Comments)    Body aches    Review of Systems  Constitutional:  Negative for chills, fever and malaise/fatigue.  Eyes:  Negative for blurred vision.  Respiratory:  Negative for shortness of breath.   Cardiovascular:  Negative for chest pain.  Neurological:  Positive for tingling. Negative for dizziness, focal weakness, weakness and headaches.      Objective:     BP (!) 144/62 (BP Location: Left Arm, Patient Position: Sitting, Cuff Size: Normal)   Pulse 85   Temp 98.4 F (36.9 C) (Oral)   Ht 5' 2.6" (1.59 m)   Wt 157 lb 8 oz (71.4 kg)   SpO2 98%  BMI 28.26 kg/m  BP Readings from Last 3 Encounters:  03/13/22 (!) 144/62  10/17/21 138/68  08/28/21 128/60   Wt Readings from Last 3 Encounters:  03/13/22 157 lb 8 oz (71.4 kg)  10/17/21 157 lb 8 oz (71.4 kg)  10/16/21 159 lb (72.1 kg)      Physical Exam Vitals reviewed.  Constitutional:      Appearance: Normal appearance.  Cardiovascular:     Rate and Rhythm: Normal rate and regular rhythm.  Pulmonary:     Effort: Pulmonary effort is normal.     Breath sounds: Normal breath sounds.  Musculoskeletal:     Comments: Left knee reveals scar from previous surgery.  No effusion.  No warmth.  No erythema.  No ecchymosis.  Full range of motion.  No patellar instability.  No medial or lateral joint line tenderness  Right elbow full range of motion.  No localized  tenderness.  Neurological:     Mental Status: She is alert.     Comments: Full strength upper extremities.  May have some very mild interosseous muscle wasting right hand.  Normal sensory function throughout right upper extremity.      No results found for any visits on 03/13/22.    The ASCVD Risk score (Arnett DK, et al., 2019) failed to calculate for the following reasons:   The 2019 ASCVD risk score is only valid for ages 30 to 79    Assessment & Plan:   #1 left knee pain.  Remote history of patellar fracture with surgery.  Recently aggravated a month ago with heavy lifting.  No signs of effusion.  No significant instability on exam.  Consider elastic knee sleeve for additional knee support  #2 paresthesias around the right elbow region.  Question ulnar nerve impingement.  She does not have any significant numbness on exam at this time and no significant weakness. -Symptoms are relatively mild and we recommend observation.  Watch for any numbness involving the hand or increased weakness.  Keep pressure off elbow is much as possible.  #3 social.  She is having tremendous stress related to her sister who lives with her who has dementia.  Progressively more difficult to manage. -We discussed setting up referral with social worker to get some potential ideas regarding long-term care options for her sister  Carolann Littler, MD

## 2022-03-13 NOTE — Patient Instructions (Signed)
Consider elastic knee sleeve for left knee.  I will try to set up referral to social worker to discuss options regarding Cassandra Foster.

## 2022-03-14 ENCOUNTER — Telehealth: Payer: Self-pay | Admitting: *Deleted

## 2022-03-14 NOTE — Progress Notes (Signed)
  Care Coordination   Note   03/14/2022 Name: Cassandra Foster MRN: [id number] DOB: [birth year]  Cassandra Foster is a 82 y.o. year old female who sees Burchette, Alinda Sierras, MD for primary care. I reached out to [name] by phone today to offer care coordination services.  [name] was given information about Care Coordination services today including:   The Care Coordination services include support from the care team which includes your Nurse Coordinator, Clinical Social Worker, or Pharmacist.  The Care Coordination team is here to help remove barriers to the health concerns and goals most important to you. Care Coordination services are voluntary, and the patient may decline or stop services at any time by request to their care team member.   Care Coordination Consent Status: Patient agreed to services and verbal consent obtained.   Follow up plan:  Telephone appointment with care coordination team member scheduled for:  03/15/2022  Encounter Outcome:  Pt. Scheduled from referral   Julian Hy, [place] Direct Dial: [phone]

## 2022-03-15 ENCOUNTER — Ambulatory Visit: Payer: Self-pay | Admitting: Licensed Clinical Social Worker

## 2022-03-20 NOTE — Patient Outreach (Signed)
  Care Coordination Late Entry  Initial Visit Note   Outreach completed 03/15/22 Name: Cassandra Foster MRN: [id number] DOB: 1940-11-10  Cassandra Foster is a 82 y.o. year old female who sees Burchette, Alinda Sierras, MD for primary care. I spoke with  [name] by phone today.  What matters to the patients health and wellness today?  Grief    Goals Addressed             This Visit's Progress    Obtain Support Services   On track    Care Coordination Interventions: Solution-Focused Strategies employed:  Mindfulness or Relaxation training provided Active listening / Reflection utilized  Emotional Support Provided Provided psychoeducation for mental health needs  Caregiver stress acknowledged  Consideration of in-home help encouraged : options discussed  Patient reports increase in overwhelming emotions triggered by grief. She has recently lost a close friend and two siblings Patient reports caregiver stress noting she provides care to sister and spouse. Currently, pt's sister is doing "very well" She has identified triggers and coping skills to regulate emotions  Strategies discussed to assist with managing sister's conditions Healthy coping skills discussed Discussed strategies to assist with re-direction to decrease sister's aggravation Family receives strong support from family, friends, and church LCSW informed pt of grief support services. Discussed strategies to recognize symptoms of grief           SDOH assessments and interventions completed:  No     Care Coordination Interventions:  Yes, provided   Follow up plan: Follow up call scheduled for 1-2 weeks    Encounter Outcome:  Pt. Visit Completed   Christa See, MSW, [email or web address] Phone [phone] 5:33 PM

## 2022-03-20 NOTE — Patient Instructions (Signed)
Visit Information  Thank you for taking time to visit with me today. Please don't hesitate to contact me if I can be of assistance to you.   Following are the goals we discussed today:   Goals Addressed             This Visit's Progress    Obtain Support Services   On track    Care Coordination Interventions: Solution-Focused Strategies employed:  Mindfulness or Relaxation training provided Active listening / Reflection utilized  Emotional Support Provided Provided psychoeducation for mental health needs  Caregiver stress acknowledged  Consideration of in-home help encouraged : options discussed  Patient reports increase in overwhelming emotions triggered by grief. She has recently lost a close friend and two siblings Patient reports caregiver stress noting she provides care to sister and spouse. Currently, pt's sister is doing "very well" She has identified triggers and coping skills to regulate emotions  Strategies discussed to assist with managing sister's conditions Healthy coping skills discussed Discussed strategies to assist with re-direction to decrease sister's aggravation Family receives strong support from family, friends, and church LCSW informed pt of grief support services. Discussed strategies to recognize symptoms of grief           Our next appointment is by telephone on 03/29/22 at 10:30 AM  Please call the care guide team at [phone] if you need to cancel or reschedule your appointment.   If you are experiencing a Mental Health or [place] or need someone to talk to, please call the Suicide and Crisis Lifeline: 988 call 911   Patient verbalizes understanding of instructions and care plan provided today and agrees to view in [place]. Active MyChart status and patient understanding of how to access instructions and care plan via MyChart confirmed with patient.     Christa See, MSW, [email or web address] Phone [phone] 5:34 PM

## 2022-03-26 ENCOUNTER — Other Ambulatory Visit: Payer: Self-pay | Admitting: Family Medicine

## 2022-03-29 ENCOUNTER — Encounter: Payer: Self-pay | Admitting: Licensed Clinical Social Worker

## 2022-03-29 ENCOUNTER — Telehealth: Payer: Self-pay | Admitting: Licensed Clinical Social Worker

## 2022-03-29 NOTE — Patient Outreach (Signed)
  Care Coordination   03/29/2022 Name: Cassandra Foster MRN: [id number] DOB: 1940-04-06   Care Coordination Outreach Attempts:  An unsuccessful telephone outreach was attempted for a scheduled appointment today.  Follow Up Plan:  Additional outreach attempts will be made to offer the patient care coordination information and services.   Encounter Outcome:  No Answer   Care Coordination Interventions:  No, not indicated    Christa See, MSW, [email or web address] Phone [phone] 10:52 AM

## 2022-04-03 NOTE — Progress Notes (Unsigned)
   I, Peterson Lombard, LAT, ATC acting as a scribe for Lynne Leader, MD.  Cassandra Foster is a 82 y.o. female who presents to [place] [facility] today for R shoulder pain. Pt was last seen by Dr. Georgina Snell on 11/15/20 for bilat shoulder pain, L>R, and was taught HEP and referred to PT, but she never schedule any visits. Today, pt reports  Dx imaging: 01/07/14 R shoulder XR  Pertinent review of systems: ***  Relevant historical information: ***   Exam:  There were no vitals taken for this visit. General: Well Developed, well nourished, and in no acute distress.   MSK: ***    Lab and Radiology Results No results found for this or any previous visit (from the past 72 hour(s)). No results found.     Assessment and Plan: 82 y.o. female with ***   PDMP not reviewed this encounter. No orders of the defined types were placed in this encounter.  No orders of the defined types were placed in this encounter.    Discussed warning signs or symptoms. Please see discharge instructions. Patient expresses understanding.   ***

## 2022-04-04 ENCOUNTER — Encounter: Payer: Self-pay | Admitting: Family Medicine

## 2022-04-04 ENCOUNTER — Ambulatory Visit: Payer: Self-pay

## 2022-04-04 ENCOUNTER — Ambulatory Visit (INDEPENDENT_AMBULATORY_CARE_PROVIDER_SITE_OTHER): Payer: Medicare PPO | Admitting: Family Medicine

## 2022-04-04 ENCOUNTER — Ambulatory Visit (INDEPENDENT_AMBULATORY_CARE_PROVIDER_SITE_OTHER): Payer: Medicare PPO

## 2022-04-04 VITALS — BP 136/80 | HR 111 | Ht 62.0 in | Wt 162.0 lb

## 2022-04-04 DIAGNOSIS — M19011 Primary osteoarthritis, right shoulder: Secondary | ICD-10-CM | POA: Diagnosis not present

## 2022-04-04 DIAGNOSIS — G8929 Other chronic pain: Secondary | ICD-10-CM

## 2022-04-04 DIAGNOSIS — M25511 Pain in right shoulder: Secondary | ICD-10-CM

## 2022-04-04 NOTE — Patient Instructions (Addendum)
Thank you for coming in today.   I've referred you to Physical Therapy.  Let us know if you don't hear from them in one week.   Please get an Xray today before you leave   You received an injection today. Seek immediate medical attention if the joint becomes red, extremely painful, or is oozing fluid.  Check back 8 weeks

## 2022-04-05 NOTE — Progress Notes (Signed)
Right shoulder x-ray shows arthritis changes.

## 2022-04-11 ENCOUNTER — Ambulatory Visit: Payer: Medicare PPO | Admitting: Physical Therapy

## 2022-04-16 ENCOUNTER — Ambulatory Visit: Payer: Medicare PPO | Attending: Family Medicine | Admitting: Physical Therapy

## 2022-04-16 ENCOUNTER — Other Ambulatory Visit: Payer: Self-pay

## 2022-04-16 ENCOUNTER — Encounter: Payer: Self-pay | Admitting: Physical Therapy

## 2022-04-16 DIAGNOSIS — M25511 Pain in right shoulder: Secondary | ICD-10-CM | POA: Diagnosis not present

## 2022-04-16 NOTE — Therapy (Signed)
OUTPATIENT PHYSICAL THERAPY SHOULDER EVALUATION   Patient Name: Cassandra Foster MRN: [id number] DOB:1940-06-17, 82 y.o., female Today's Date: 04/16/2022  END OF SESSION:  PT End of Session - 04/16/22 1509     Visit Number 1    Number of Visits 12    Date for PT Re-Evaluation 05/28/22    Authorization Type FOTO AT LEAST EVERY 5TH VISIT.  PROGRESS NOTE AT 10TH VISIT.  KX MODIFIER AFTER 15 VISITS.    PT Start Time 0145    PT Stop Time 0233    PT Time Calculation (min) 48 min    Activity Tolerance Patient tolerated treatment well    Behavior During Therapy WFL for tasks assessed/performed             Past Medical History:  Diagnosis Date   Chronic insomnia    Colon polyps    Hyperlipidemia    Hypertension    Osteoporosis    Past Surgical History:  Procedure Laterality Date   BREAST EXCISIONAL BIOPSY Left    benign, lump removed many years ago   [place]  multiple   KNEE SURGERY  2009   kneecap   OTHER SURGICAL HISTORY     childbirth x 3   TUBAL LIGATION     BP dropped    Patient Active Problem List   Diagnosis Date Noted   Vitamin D deficiency 01/17/2018   Hx of colonic polyps 03/09/2014   Family history of colon cancer - brother 03/09/2014   Rotator cuff arthropathy 08/18/2013   Hyponatremia 07/30/2013   Headache 04/06/2013   Blurred vision, right eye 04/06/2013   GERD (gastroesophageal reflux disease) 01/10/2012   Syncope 08/04/2011   Vitreous detachment, right 07/02/2011   Osteoporosis 11/30/2010   Hypertension 11/30/2010   Hyperlipidemia 11/30/2010   Chronic insomnia 11/30/2010    REFERRING PROVIDER: Lynne Leader MD  REFERRING DIAG: Right shoulder pain.  THERAPY DIAG:  Acute pain of right shoulder  Rationale for Evaluation and Treatment: Rehabilitation  ONSET DATE: Around Christmas (2023).  SUBJECTIVE:                                                                                                                                                                                       SUBJECTIVE STATEMENT: The patient presents to the clinic today with c/o right shoulder pain that began after lifting and trying to throw a 40# fertilizer bag and picking up Christmas decorations.  Her pain is rated at a 4/10 today but can rise to much higher levels with certain movements.  Resting her arm by her side decreases her pain.  PERTINENT HISTORY: HTN, OP.  PAIN:  Are you having pain? Yes: NPRS scale: 4/10 Pain location: Right shoulder. Pain description: Ache. Aggravating factors: Movement rest. Relieving factors: Resting with right arm by side.  PRECAUTIONS: None  WEIGHT BEARING RESTRICTIONS: No  FALLS:  Has patient fallen in last 6 months? No  LIVING ENVIRONMENT: Lives with: lives with their spouse Lives in: House/apartment Has following equipment at home: None  OCCUPATION: Retired.  PLOF: Independent  PATIENT GOALS:Get out of pain.  NEXT MD VISIT:   OBJECTIVE:   DIAGNOSTIC FINDINGS:  FINDINGS: AC joint and glenohumeral degenerative changes. No fracture, dislocation, or other bony/soft tissue abnormalities identified.   IMPRESSION: AC joint and glenohumeral degenerative changes.  Ultrasound evaluation prior to injection reveals moderate subacromial bursitis. Supraspinatus tendon is intact without full-thickness retracted rotator cuff tear.   POSTURE: Good posture.  UPPER EXTREMITY ROM:  Full active right shoulder strength.  UPPER EXTREMITY MMT:      Normal right shoulder strength.   SHOULDER SPECIAL TESTS: Impingement tests: No significant with testing. Biceps assessment: (-) right Speed's test.  DTR's:  Normal UE DTR's.  PALPATION:  Patient c/o some pain over her right bicipital groove with likely referred pain over her bicep muscle.    TODAY'S TREATMENT:                                                                                                                                          DATE: HMP and IFC at 80-150 Hz on 40% scan x 20 minutes to patient's right shoulder.  Patient tolerated treatment without complaint with normal modality response following removal of modality.     PATIENT EDUCATION: Education details: Short duration ice massage (2-3 minutes). Person educated: Patient Education method: Customer service manager Education comprehension: verbalized understanding  ASSESSMENT:  CLINICAL IMPRESSION: The patient presents to OPPT with c/o of right shoulder pain since lifting a heavy object around Christmas time last year.  The pain can reach high levels with certain movements and lifting objects.  She c/o some palpable pain over her bicipital groove region.  She demonstrates full right shoulder range of motion and normal strength.  Special testing did not reproduce pain.  Patient will benefit from skilled physical therapy intervention to address pain and deficits.  OBJECTIVE IMPAIRMENTS: decreased activity tolerance and pain.   ACTIVITY LIMITATIONS: carrying and lifting  PARTICIPATION LIMITATIONS: cleaning and laundry  REHAB POTENTIAL: Excellent  CLINICAL DECISION MAKING: Stable/uncomplicated  EVALUATION COMPLEXITY: Low   GOALS:  LONG TERM GOALS: Target date: 05/28/22.  Ind with a HEP.  Goal status: INITIAL  2.  Perform ADL's with right shoulder pain not > 2/10.  Goal status: INITIAL   PLAN:  PT FREQUENCY: 2x/week  PT DURATION: 6 weeks  PLANNED INTERVENTIONS: Therapeutic exercises, Therapeutic activity, Neuromuscular re-education, Patient/Family education, Self Care, Dry Needling, Electrical stimulation, Cryotherapy, Moist heat, Vasopneumatic device, Ultrasound, Ionotophoresis 46m/ml Dexamethasone,  and Manual therapy  PLAN FOR NEXT SESSION: Modalities and STW/M as needed.  RW4, full can, SDLY ER, PRE's.   Rohini Jaroszewski, [place], PT 04/16/2022, 3:41 PM

## 2022-04-18 ENCOUNTER — Ambulatory Visit: Payer: Medicare PPO

## 2022-04-18 DIAGNOSIS — M25511 Pain in right shoulder: Secondary | ICD-10-CM

## 2022-04-18 NOTE — Therapy (Signed)
OUTPATIENT PHYSICAL THERAPY SHOULDER EVALUATION   Patient Name: Cassandra Foster MRN: [id number] DOB:06-Oct-1940, 82 y.o., female Today's Date: 04/18/2022  END OF SESSION:  PT End of Session - 04/18/22 1347     Visit Number 2    Number of Visits 12    Date for PT Re-Evaluation 05/28/22    Authorization Type FOTO AT LEAST EVERY 5TH VISIT.  PROGRESS NOTE AT 10TH VISIT.  KX MODIFIER AFTER 15 VISITS.    PT Start Time 1345    PT Stop Time 1440    PT Time Calculation (min) 55 min    Activity Tolerance Patient tolerated treatment well    Behavior During Therapy WFL for tasks assessed/performed             Past Medical History:  Diagnosis Date   Chronic insomnia    Colon polyps    Hyperlipidemia    Hypertension    Osteoporosis    Past Surgical History:  Procedure Laterality Date   BREAST EXCISIONAL BIOPSY Left    benign, lump removed many years ago   [place]  multiple   KNEE SURGERY  2009   kneecap   OTHER SURGICAL HISTORY     childbirth x 3   TUBAL LIGATION     BP dropped    Patient Active Problem List   Diagnosis Date Noted   Vitamin D deficiency 01/17/2018   Hx of colonic polyps 03/09/2014   Family history of colon cancer - brother 03/09/2014   Rotator cuff arthropathy 08/18/2013   Hyponatremia 07/30/2013   Headache 04/06/2013   Blurred vision, right eye 04/06/2013   GERD (gastroesophageal reflux disease) 01/10/2012   Syncope 08/04/2011   Vitreous detachment, right 07/02/2011   Osteoporosis 11/30/2010   Hypertension 11/30/2010   Hyperlipidemia 11/30/2010   Chronic insomnia 11/30/2010    REFERRING PROVIDER: Lynne Leader MD  REFERRING DIAG: Right shoulder pain.  THERAPY DIAG:  Acute pain of right shoulder  Rationale for Evaluation and Treatment: Rehabilitation  ONSET DATE: Around Christmas (2023).  SUBJECTIVE:                                                                                                                                                                                       SUBJECTIVE STATEMENT: The patient presents to the clinic today with c/o right shoulder pain that began after lifting and trying to throw a 40# fertilizer bag and picking up Christmas decorations.  Her pain is rated at a 4/10 today but can rise to much higher levels with certain movements.  Resting her arm by her side decreases her pain.  PERTINENT HISTORY: HTN, OP.  PAIN:  Are you having pain? Yes: NPRS scale: 4/10 Pain location: Right shoulder. Pain description: Ache. Aggravating factors: Movement rest. Relieving factors: Resting with right arm by side.  PRECAUTIONS: None  WEIGHT BEARING RESTRICTIONS: No  FALLS:  Has patient fallen in last 6 months? No  LIVING ENVIRONMENT: Lives with: lives with their spouse Lives in: House/apartment Has following equipment at home: None  OCCUPATION: Retired.  PLOF: Independent  PATIENT GOALS:Get out of pain.  NEXT MD VISIT:   OBJECTIVE:   DIAGNOSTIC FINDINGS:  FINDINGS: AC joint and glenohumeral degenerative changes. No fracture, dislocation, or other bony/soft tissue abnormalities identified.   IMPRESSION: AC joint and glenohumeral degenerative changes.  Ultrasound evaluation prior to injection reveals moderate subacromial bursitis. Supraspinatus tendon is intact without full-thickness retracted rotator cuff tear.   POSTURE: Good posture.  UPPER EXTREMITY ROM:  Full active right shoulder strength.  UPPER EXTREMITY MMT:      Normal right shoulder strength.   SHOULDER SPECIAL TESTS: Impingement tests: No significant with testing. Biceps assessment: (-) right Speed's test.  DTR's:  Normal UE DTR's.  PALPATION:  Patient c/o some pain over her right bicipital groove with likely referred pain over her bicep muscle.    TODAY'S TREATMENT:                                                                                                                                          DATE:                                     EXERCISE LOG  Exercise Repetitions and Resistance Comments  Pulleys x5 mins   Rows Yellow x 20 reps   Extensions Yellow x 20 reps   Horizontal Abduction Yellow x 20 reps   ER Yellow x 20 reps   IR Yellow x 20 reps   Protractions Yellow x 20 reps   Side-lying ER     Blank cell = exercise not performed today   Manual Therapy Soft Tissue Mobilization: right shoulder, STW/M to right anterior and posterior shoulder capsule region and right bicep to decrease pain and tone    Modalities  Date:  Unattended Estim: Shoulder, IFC 80-150 Hz, 15 mins, Pain and Tone Hot Pack: Shoulder, 15 mins, Pain   PATIENT EDUCATION: Education details: Short duration ice massage (2-3 minutes). Person educated: Patient Education method: Customer service manager Education comprehension: verbalized understanding  ASSESSMENT:  CLINICAL IMPRESSION: Pt arrives for today's treatment session 3/10 right shoulder pain.  Pt's FOTO score 53 on intake.  Pt introduced to pulleys and theraband exercises today with min verbal and tactile cues for proper technique and posture.  STW/M performed to right anterior and posterior shoulder capsule and right bicep to decrease pain and tone.  Normal responses to estim and  MH noted upon removal.  Pt reported decreased time at completion of today's treatment session.  OBJECTIVE IMPAIRMENTS: decreased activity tolerance and pain.   ACTIVITY LIMITATIONS: carrying and lifting  PARTICIPATION LIMITATIONS: cleaning and laundry  REHAB POTENTIAL: Excellent  CLINICAL DECISION MAKING: Stable/uncomplicated  EVALUATION COMPLEXITY: Low   GOALS:  LONG TERM GOALS: Target date: 05/28/22.  Ind with a HEP.  Goal status: INITIAL  2.  Perform ADL's with right shoulder pain not > 2/10.  Goal status: INITIAL   PLAN:  PT FREQUENCY: 2x/week  PT DURATION: 6 weeks  PLANNED INTERVENTIONS: Therapeutic exercises,  Therapeutic activity, Neuromuscular re-education, Patient/Family education, Self Care, Dry Needling, Electrical stimulation, Cryotherapy, Moist heat, Vasopneumatic device, Ultrasound, Ionotophoresis 56m/ml Dexamethasone, and Manual therapy  PLAN FOR NEXT SESSION: Modalities and STW/M as needed.  RW4, full can, SDLY ER, PRE's.   JKathrynn Ducking PTA 04/18/2022, 2:47 PM

## 2022-04-23 ENCOUNTER — Ambulatory Visit: Payer: Medicare PPO | Admitting: Physical Therapy

## 2022-04-23 ENCOUNTER — Encounter: Payer: Self-pay | Admitting: Physical Therapy

## 2022-04-23 DIAGNOSIS — M25511 Pain in right shoulder: Secondary | ICD-10-CM | POA: Diagnosis not present

## 2022-04-23 NOTE — Therapy (Signed)
OUTPATIENT PHYSICAL THERAPY SHOULDER TREATMENT   Patient Name: Cassandra Foster MRN: [id number] DOB:[birth year], 82 y.o., female Today's Date: 04/23/2022  END OF SESSION:  PT End of Session - 04/23/22 1518     Visit Number 3    Number of Visits 12    Date for PT Re-Evaluation 05/28/22    Authorization Type FOTO AT LEAST EVERY 5TH VISIT.  PROGRESS NOTE AT 10TH VISIT.  KX MODIFIER AFTER 15 VISITS.    PT Start Time 0147    PT Stop Time 0241    PT Time Calculation (min) 54 min    Activity Tolerance Patient tolerated treatment well    Behavior During Therapy WFL for tasks assessed/performed             Past Medical History:  Diagnosis Date   Chronic insomnia    Colon polyps    Hyperlipidemia    Hypertension    Osteoporosis    Past Surgical History:  Procedure Laterality Date   BREAST EXCISIONAL BIOPSY Left    benign, lump removed many years ago   [place]  multiple   KNEE SURGERY  2009   kneecap   OTHER SURGICAL HISTORY     childbirth x 3   TUBAL LIGATION     BP dropped    Patient Active Problem List   Diagnosis Date Noted   Vitamin D deficiency 01/17/2018   Hx of colonic polyps 03/09/2014   Family history of colon cancer - brother 03/09/2014   Rotator cuff arthropathy 08/18/2013   Hyponatremia 07/30/2013   Headache 04/06/2013   Blurred vision, right eye 04/06/2013   GERD (gastroesophageal reflux disease) 01/10/2012   Syncope 08/04/2011   Vitreous detachment, right 07/02/2011   Osteoporosis 11/30/2010   Hypertension 11/30/2010   Hyperlipidemia 11/30/2010   Chronic insomnia 11/30/2010    REFERRING PROVIDER: Lynne Leader MD  REFERRING DIAG: Right shoulder pain.  THERAPY DIAG:  Acute pain of right shoulder  Rationale for Evaluation and Treatment: Rehabilitation  ONSET DATE: Around Christmas (2023).  SUBJECTIVE:                                                                                                                                                                                       SUBJECTIVE STATEMENT: Pain low today but have done too much with arm today. PERTINENT HISTORY: HTN, OP.  PAIN:  Are you having pain? Yes: NPRS scale: 2/10 Pain location: Right shoulder. Pain description: Ache. Aggravating factors: Movement rest. Relieving factors: Resting with right arm by side.  PRECAUTIONS: None  WEIGHT BEARING RESTRICTIONS: No  FALLS:  Has patient  fallen in last 6 months? No  LIVING ENVIRONMENT: Lives with: lives with their spouse Lives in: House/apartment Has following equipment at home: None  OCCUPATION: Retired.  PLOF: Independent  PATIENT GOALS:Get out of pain.  NEXT MD VISIT:   OBJECTIVE:   DIAGNOSTIC FINDINGS:  FINDINGS: AC joint and glenohumeral degenerative changes. No fracture, dislocation, or other bony/soft tissue abnormalities identified.   IMPRESSION: AC joint and glenohumeral degenerative changes.  Ultrasound evaluation prior to injection reveals moderate subacromial bursitis. Supraspinatus tendon is intact without full-thickness retracted rotator cuff tear.   POSTURE: Good posture.  UPPER EXTREMITY ROM:  Full active right shoulder strength.  UPPER EXTREMITY MMT:      Normal right shoulder strength.   SHOULDER SPECIAL TESTS: Impingement tests: No significant with testing. Biceps assessment: (-) right Speed's test.  DTR's:  Normal UE DTR's.  PALPATION:  Patient c/o some pain over her right bicipital groove with likely referred pain over her bicep muscle.    TODAY'S TREATMENT:                                                                                                                                         DATE: 04/23/22:  Combo e'stim/US (small soundhead) at 1.50 W/CM2 x 12 minutes to patient's right bicipital groove region f/b Soft Tissue Mobilization: right shoulder, STW/M to right anterior and posterior shoulder cuff muscualture region  and right bicep to decrease pain and tone  f/b  Modalities  Date:  Unattended Estim: Shoulder, IFC 80-150 Hz, 20 mins, Pain and Tone Hot Pack: Shoulder, 20 mins, Pain   PATIENT EDUCATION: Education details: Short duration ice massage (2-3 minutes). Person educated: Patient Education method: Customer service manager Education comprehension: verbalized understanding  ASSESSMENT:  CLINICAL IMPRESSION: Patient pleased with progress and did great with treatment today with no pain complaints following treatment.  Normal modality response following removal of modality.  OBJECTIVE IMPAIRMENTS: decreased activity tolerance and pain.   ACTIVITY LIMITATIONS: carrying and lifting  PARTICIPATION LIMITATIONS: cleaning and laundry  REHAB POTENTIAL: Excellent  CLINICAL DECISION MAKING: Stable/uncomplicated  EVALUATION COMPLEXITY: Low   GOALS:  LONG TERM GOALS: Target date: 05/28/22.  Ind with a HEP.  Goal status: INITIAL  2.  Perform ADL's with right shoulder pain not > 2/10.  Goal status: INITIAL   PLAN:  PT FREQUENCY: 2x/week  PT DURATION: 6 weeks  PLANNED INTERVENTIONS: Therapeutic exercises, Therapeutic activity, Neuromuscular re-education, Patient/Family education, Self Care, Dry Needling, Electrical stimulation, Cryotherapy, Moist heat, Vasopneumatic device, Ultrasound, Ionotophoresis '4mg'$ /ml Dexamethasone, and Manual therapy  PLAN FOR NEXT SESSION: Modalities and STW/M as needed.  RW4, full can, SDLY ER, PRE's.   Dariann Huckaba, [place], PT 04/23/2022, 3:23 PM

## 2022-04-25 ENCOUNTER — Encounter: Payer: Self-pay | Admitting: Physical Therapy

## 2022-04-25 ENCOUNTER — Ambulatory Visit: Payer: Medicare PPO | Admitting: Physical Therapy

## 2022-04-25 DIAGNOSIS — M25511 Pain in right shoulder: Secondary | ICD-10-CM

## 2022-04-25 NOTE — Therapy (Signed)
OUTPATIENT PHYSICAL THERAPY SHOULDER TREATMENT   Patient Name: Cassandra Foster MRN: [id number] DOB:10-23-1940, 82 y.o., female Today's Date: 04/25/2022  END OF SESSION:  PT End of Session - 04/25/22 1423     Visit Number 4    Number of Visits 12    Date for PT Re-Evaluation 05/28/22    Authorization Type FOTO AT LEAST EVERY 5TH VISIT.  PROGRESS NOTE AT 10TH VISIT.  KX MODIFIER AFTER 15 VISITS.    PT Start Time 0144    PT Stop Time 0234    PT Time Calculation (min) 50 min    Activity Tolerance Patient tolerated treatment well    Behavior During Therapy WFL for tasks assessed/performed             Past Medical History:  Diagnosis Date   Chronic insomnia    Colon polyps    Hyperlipidemia    Hypertension    Osteoporosis    Past Surgical History:  Procedure Laterality Date   BREAST EXCISIONAL BIOPSY Left    benign, lump removed many years ago   [place]  multiple   KNEE SURGERY  2009   kneecap   OTHER SURGICAL HISTORY     childbirth x 3   TUBAL LIGATION     BP dropped    Patient Active Problem List   Diagnosis Date Noted   Vitamin D deficiency 01/17/2018   Hx of colonic polyps 03/09/2014   Family history of colon cancer - brother 03/09/2014   Rotator cuff arthropathy 08/18/2013   Hyponatremia 07/30/2013   Headache 04/06/2013   Blurred vision, right eye 04/06/2013   GERD (gastroesophageal reflux disease) 01/10/2012   Syncope 08/04/2011   Vitreous detachment, right 07/02/2011   Osteoporosis 11/30/2010   Hypertension 11/30/2010   Hyperlipidemia 11/30/2010   Chronic insomnia 11/30/2010    REFERRING PROVIDER: Lynne Leader MD  REFERRING DIAG: Right shoulder pain.  THERAPY DIAG:  Acute pain of right shoulder  Rationale for Evaluation and Treatment: Rehabilitation  ONSET DATE: Around Christmas (2023).  SUBJECTIVE:                                                                                                                                                                                       SUBJECTIVE STATEMENT: Doing a lot better.  PERTINENT HISTORY: HTN, OP.  PAIN:  Are you having pain? Yes: NPRS scale: 2/10 Pain location: Right shoulder. Pain description: Ache. Aggravating factors: Movement rest. Relieving factors: Resting with right arm by side.  PRECAUTIONS: None  WEIGHT BEARING RESTRICTIONS: No  FALLS:  Has patient fallen in last 6 months? No  LIVING ENVIRONMENT: Lives with: lives with their spouse Lives in: House/apartment Has following equipment at home: None  OCCUPATION: Retired.  PLOF: Independent  PATIENT GOALS:Get out of pain.  NEXT MD VISIT:   OBJECTIVE:   DIAGNOSTIC FINDINGS:  FINDINGS: AC joint and glenohumeral degenerative changes. No fracture, dislocation, or other bony/soft tissue abnormalities identified.   IMPRESSION: AC joint and glenohumeral degenerative changes.  Ultrasound evaluation prior to injection reveals moderate subacromial bursitis. Supraspinatus tendon is intact without full-thickness retracted rotator cuff tear.   POSTURE: Good posture.  UPPER EXTREMITY ROM:  Full active right shoulder strength.  UPPER EXTREMITY MMT:      Normal right shoulder strength.   SHOULDER SPECIAL TESTS: Impingement tests: No significant with testing. Biceps assessment: (-) right Speed's test.  DTR's:  Normal UE DTR's.  PALPATION:  Patient c/o some pain over her right bicipital groove with likely referred pain over her bicep muscle.    TODAY'S TREATMENT:                                                                                                                                         DATE: 04/25/22: UBE at 120 RPM's x 8 minutes f/b RW4 with yellow band and bicep curls with yellow band (1 minute each exercise) f/b  Combo e'stim/US (small soundhead) at 1.50 W/CM2 x 7 minutes to patient's right biceps region f/b STW/M x 5 minutes f/b IFC at 80-150 Hz on  40% scan x 15 minutes.     PATIENT EDUCATION: Education details: Short duration ice massage (2-3 minutes). Person educated: Patient Education method: Customer service manager Education comprehension: verbalized understanding  ASSESSMENT:  CLINICAL IMPRESSION: Patient did great with therex today and was provided with theraband for her HEP.  Minimal pain today and some tingling into biceps. She will likely drop down to one visit next week as she is very pleased with her progress. OBJECTIVE IMPAIRMENTS: decreased activity tolerance and pain.   ACTIVITY LIMITATIONS: carrying and lifting  PARTICIPATION LIMITATIONS: cleaning and laundry  REHAB POTENTIAL: Excellent  CLINICAL DECISION MAKING: Stable/uncomplicated  EVALUATION COMPLEXITY: Low   GOALS:  LONG TERM GOALS: Target date: 05/28/22.  Ind with a HEP.  Goal status: INITIAL  2.  Perform ADL's with right shoulder pain not > 2/10.  Goal status: INITIAL   PLAN:  PT FREQUENCY: 2x/week  PT DURATION: 6 weeks  PLANNED INTERVENTIONS: Therapeutic exercises, Therapeutic activity, Neuromuscular re-education, Patient/Family education, Self Care, Dry Needling, Electrical stimulation, Cryotherapy, Moist heat, Vasopneumatic device, Ultrasound, Ionotophoresis '4mg'$ /ml Dexamethasone, and Manual therapy  PLAN FOR NEXT SESSION: Modalities and STW/M as needed.  RW4, full can, SDLY ER, PRE's.   Ambers Iyengar, [place], PT 04/25/2022, 2:52 PM

## 2022-04-30 ENCOUNTER — Ambulatory Visit: Payer: Medicare PPO | Attending: Family Medicine

## 2022-04-30 DIAGNOSIS — M25511 Pain in right shoulder: Secondary | ICD-10-CM | POA: Diagnosis not present

## 2022-04-30 NOTE — Therapy (Signed)
OUTPATIENT PHYSICAL THERAPY SHOULDER TREATMENT   Patient Name: Cassandra Foster MRN: [id number] DOB:1941/01/24, 82 y.o., female Today's Date: 04/30/2022  END OF SESSION:  PT End of Session - 04/30/22 1345     Visit Number 5    Number of Visits 12    Date for PT Re-Evaluation 05/28/22    Authorization Type FOTO AT LEAST EVERY 5TH VISIT.  PROGRESS NOTE AT 10TH VISIT.  KX MODIFIER AFTER 15 VISITS.    PT Start Time 1340    Activity Tolerance Patient tolerated treatment well    Behavior During Therapy WFL for tasks assessed/performed             Past Medical History:  Diagnosis Date   Chronic insomnia    Colon polyps    Hyperlipidemia    Hypertension    Osteoporosis    Past Surgical History:  Procedure Laterality Date   BREAST EXCISIONAL BIOPSY Left    benign, lump removed many years ago   Impact  multiple   KNEE SURGERY  2009   kneecap   OTHER SURGICAL HISTORY     childbirth x 3   TUBAL LIGATION     BP dropped    Patient Active Problem List   Diagnosis Date Noted   Vitamin D deficiency 01/17/2018   Hx of colonic polyps 03/09/2014   Family history of colon cancer - brother 03/09/2014   Rotator cuff arthropathy 08/18/2013   Hyponatremia 07/30/2013   Headache 04/06/2013   Blurred vision, right eye 04/06/2013   GERD (gastroesophageal reflux disease) 01/10/2012   Syncope 08/04/2011   Vitreous detachment, right 07/02/2011   Osteoporosis 11/30/2010   Hypertension 11/30/2010   Hyperlipidemia 11/30/2010   Chronic insomnia 11/30/2010    REFERRING PROVIDER: Lynne Leader MD  REFERRING DIAG: Right shoulder pain.  THERAPY DIAG:  Acute pain of right shoulder  Rationale for Evaluation and Treatment: Rehabilitation  ONSET DATE: Around Christmas (2023).  SUBJECTIVE:                                                                                                                                                                                       SUBJECTIVE STATEMENT: Pt reports feeling a lot better.  PERTINENT HISTORY: HTN, OP.  PAIN:  Are you having pain? Yes: NPRS scale: 1/10 Pain location: Right shoulder. Pain description: Ache. Aggravating factors: Movement rest. Relieving factors: Resting with right arm by side.  PRECAUTIONS: None  WEIGHT BEARING RESTRICTIONS: No  FALLS:  Has patient fallen in last 6 months? No  LIVING ENVIRONMENT: Lives with: lives with their spouse Lives in: House/apartment Has following  equipment at home: None  OCCUPATION: Retired.  PLOF: Independent  PATIENT GOALS:Get out of pain.  NEXT MD VISIT:   OBJECTIVE:   DIAGNOSTIC FINDINGS:  FINDINGS: AC joint and glenohumeral degenerative changes. No fracture, dislocation, or other bony/soft tissue abnormalities identified.   IMPRESSION: AC joint and glenohumeral degenerative changes.  Ultrasound evaluation prior to injection reveals moderate subacromial bursitis. Supraspinatus tendon is intact without full-thickness retracted rotator cuff tear.   POSTURE: Good posture.  UPPER EXTREMITY ROM:  Full active right shoulder strength.  UPPER EXTREMITY MMT:      Normal right shoulder strength.   SHOULDER SPECIAL TESTS: Impingement tests: No significant with testing. Biceps assessment: (-) right Speed's test.  DTR's:  Normal UE DTR's.  PALPATION:  Patient c/o some pain over her right bicipital groove with likely referred pain over her bicep muscle.    TODAY'S TREATMENT:                                                                                                                                         DATE: 04/30/22:                                   EXERCISE LOG  Exercise Repetitions and Resistance Comments  UBE x10 mins  (forward/backward)   RW4 Yellow x 1.5 mins each   Biceps Curls Yellow x 1.5 mins            Blank cell = exercise not performed today   Modalities  Date:  Unattended Estim: Shoulder,  IFC 80-150 Hz, 15 mins, Pain Combo: Shoulder, 1.5 w/cm2, 8 mins, Pain Hot Pack: Shoulder, 15 mins, Pain and Tone  Manual Therapy Soft Tissue Mobilization: right shoulder, STW/M to right biceps to decrease pain and tone     PATIENT EDUCATION: Education details: Short duration ice massage (2-3 minutes). Person educated: Patient Education method: Customer service manager Education comprehension: verbalized understanding  ASSESSMENT:  CLINICAL IMPRESSION: Pt arrives for today's treatment session reporting 1/10 right shoulder pain. Pt reports performing exercises at home with good results.  Pt increased FOTO score to 62 today.  Pt able to tolerate increased reps with theraband exercises today without complaint of pain or fatigue.  Normal responses to all modalities noted upon completion.  Pt denied any pain at completion of treatment session.  Pt wanting to go on hold for now, instructed to call facility in 1-2 weeks.   OBJECTIVE IMPAIRMENTS: decreased activity tolerance and pain.   ACTIVITY LIMITATIONS: carrying and lifting  PARTICIPATION LIMITATIONS: cleaning and laundry  REHAB POTENTIAL: Excellent  CLINICAL DECISION MAKING: Stable/uncomplicated  EVALUATION COMPLEXITY: Low   GOALS:  LONG TERM GOALS: Target date: 05/28/22.  Ind with a HEP.  Goal status: IN PROGRESS  2.  Perform ADL's with right shoulder pain not > 2/10.  Goal status: IN PROGRESS  PLAN:  PT FREQUENCY: 2x/week  PT DURATION: 6 weeks  PLANNED INTERVENTIONS: Therapeutic exercises, Therapeutic activity, Neuromuscular re-education, Patient/Family education, Self Care, Dry Needling, Electrical stimulation, Cryotherapy, Moist heat, Vasopneumatic device, Ultrasound, Ionotophoresis '4mg'$ /ml Dexamethasone, and Manual therapy  PLAN FOR NEXT SESSION: Modalities and STW/M as needed.  RW4, full can, SDLY ER, PRE's.   Kathrynn Ducking, PTA 04/30/2022, 1:46 PM

## 2022-05-02 ENCOUNTER — Encounter: Payer: Medicare PPO | Admitting: Physical Therapy

## 2022-05-18 ENCOUNTER — Telehealth: Payer: Self-pay

## 2022-05-18 ENCOUNTER — Other Ambulatory Visit (HOSPITAL_COMMUNITY): Payer: Self-pay

## 2022-05-18 NOTE — Telephone Encounter (Signed)
Pharmacy Patient Advocate Encounter  Prior Authorization for Cassandra Foster has been approved by [employer] (ins).    PA # [id number] Effective dates: 04/24/2021 through 02/26/2023  Copay is $64.00  [facility] [phone]

## 2022-05-23 ENCOUNTER — Other Ambulatory Visit: Payer: Self-pay | Admitting: Family Medicine

## 2022-05-30 ENCOUNTER — Ambulatory Visit: Payer: Medicare PPO | Admitting: Family Medicine

## 2022-06-07 ENCOUNTER — Ambulatory Visit (INDEPENDENT_AMBULATORY_CARE_PROVIDER_SITE_OTHER): Payer: Medicare PPO

## 2022-06-07 DIAGNOSIS — M810 Age-related osteoporosis without current pathological fracture: Secondary | ICD-10-CM | POA: Diagnosis not present

## 2022-06-07 MED ORDER — DENOSUMAB 60 MG/ML ~~LOC~~ SOSY
60.0000 mg | PREFILLED_SYRINGE | Freq: Once | SUBCUTANEOUS | Status: AC
  Administered 2022-06-07: 60 mg via SUBCUTANEOUS

## 2022-06-07 NOTE — Progress Notes (Signed)
Per orders of Dr. Salomon Fick, injection of Denosumab 60 mg/ml given by [facility] Rakeisha Nyce. Patient tolerated injection well.

## 2022-07-02 ENCOUNTER — Other Ambulatory Visit: Payer: Self-pay | Admitting: Family Medicine

## 2022-08-17 ENCOUNTER — Ambulatory Visit: Payer: Medicare PPO | Admitting: Family Medicine

## 2022-08-17 ENCOUNTER — Encounter: Payer: Self-pay | Admitting: Family Medicine

## 2022-08-17 VITALS — BP 154/66 | HR 87 | Temp 97.8°F | Ht 62.0 in | Wt 156.7 lb

## 2022-08-17 DIAGNOSIS — F439 Reaction to severe stress, unspecified: Secondary | ICD-10-CM | POA: Diagnosis not present

## 2022-08-17 DIAGNOSIS — B353 Tinea pedis: Secondary | ICD-10-CM | POA: Diagnosis not present

## 2022-08-17 DIAGNOSIS — I10 Essential (primary) hypertension: Secondary | ICD-10-CM

## 2022-08-17 MED ORDER — METOPROLOL SUCCINATE ER 25 MG PO TB24: 25.0000 mg | ORAL_TABLET | Freq: Every day | ORAL | 3 refills | Status: DC

## 2022-08-17 MED ORDER — KETOCONAZOLE 2 % EX CREA: 1.0000 | TOPICAL_CREAM | Freq: Every day | CUTANEOUS | 2 refills | Status: DC

## 2022-08-17 NOTE — Progress Notes (Signed)
Established Patient Office Visit  Subjective   Patient ID: JAYLON GRODE, female    DOB: 12-Apr-1940  Age: 82 y.o. MRN: [id number]  Chief Complaint  Patient presents with   Hypertension    HPI   Ms. Biegler has history of hypertension, GERD, osteoporosis, chronic insomnia, hyperlipidemia.  Seen today to discuss multiple issues as follows  Recently had bilateral foot rash.  She thinks this is fungal.  This occurred after her feet got very moist.  She tried some Tinactin along with ketoconazole 2% cream and this seemed to clear her rash.  She is basically requesting refill of ketoconazole.  Hypertension currently treated with multidrug regimen including amlodipine 5 mg daily, losartan/HCTZ 100/12.5 mg 1 daily.  She recently is noted her heart rate seems to be up somewhat around 90 or so which is above her usual baseline.  No irregularity.  No dizziness, dyspnea, or chest pain.  Under tremendous stress.  She is taking care of her sister who has advanced dementia and she is becoming more of a burden over time.  Her husband also has some dementia issues.  She also has a son and grandson that live across the street who both have behavioral with issues.  Miaya drinks about 2 cups of coffee per day.  Past Medical History:  Diagnosis Date   Chronic insomnia    Colon polyps    Hyperlipidemia    Hypertension    Osteoporosis    Past Surgical History:  Procedure Laterality Date   BREAST EXCISIONAL BIOPSY Left    benign, lump removed many years ago   BREAST SURGERY     1985, 1997   COLONOSCOPY  multiple   KNEE SURGERY  2009   kneecap   OTHER SURGICAL HISTORY     childbirth x 3   TUBAL LIGATION     BP dropped     reports that she has never smoked. She has never used smokeless tobacco. She reports that she does not drink alcohol and does not use drugs. family history includes Breast cancer in her mother; Cancer in her mother; Colon cancer (age of onset: 26) in her brother; Colon cancer  (age of onset: 55) in an other family member; Prostate cancer in her brother; Stroke in her father. Allergies  Allergen Reactions   Follow-Up [Calcilo Xd]    Simvastatin Other (See Comments)    Body aches    Review of Systems  Constitutional:  Negative for malaise/fatigue.  Eyes:  Negative for blurred vision.  Respiratory:  Negative for shortness of breath.   Cardiovascular:  Negative for chest pain, orthopnea, leg swelling and PND.  Neurological:  Negative for dizziness, weakness and headaches.      Objective:     BP (!) 154/66 (BP Location: Left Arm, Patient Position: Sitting, Cuff Size: Normal)   Pulse 87   Temp 97.8 F (36.6 C) (Oral)   Ht 5\' 2"  (1.575 m)   Wt 156 lb 11.2 oz (71.1 kg)   SpO2 99%   BMI 28.66 kg/m  BP Readings from Last 3 Encounters:  08/17/22 (!) 154/66  04/04/22 136/80  03/13/22 (!) 144/62   Wt Readings from Last 3 Encounters:  08/17/22 156 lb 11.2 oz (71.1 kg)  04/04/22 162 lb (73.5 kg)  03/13/22 157 lb 8 oz (71.4 kg)      Physical Exam Vitals reviewed.  Constitutional:      Appearance: Normal appearance.  Cardiovascular:     Rate and Rhythm: Normal rate and  regular rhythm.  Pulmonary:     Effort: Pulmonary effort is normal.     Breath sounds: Normal breath sounds. No wheezing or rales.  Musculoskeletal:     Right lower leg: No edema.     Left lower leg: No edema.  Neurological:     Mental Status: She is alert.      No results found for any visits on 08/17/22.  Last CBC Lab Results  Component Value Date   WBC 8.1 10/17/2021   HGB 13.8 10/17/2021   HCT 40.7 10/17/2021   MCV 93.0 10/17/2021   MCH 31.7 04/06/2013   RDW 13.3 10/17/2021   PLT 158.0 10/17/2021   Last metabolic panel Lab Results  Component Value Date   GLUCOSE 94 10/17/2021   NA 131 (L) 10/17/2021   K 3.7 10/17/2021   CL 96 10/17/2021   CO2 22 10/17/2021   BUN 12 10/17/2021   CREATININE 0.65 10/17/2021   GFRNONAA 87 (L) 04/06/2013   CALCIUM 10.2  10/17/2021   PROT 7.4 10/17/2021   ALBUMIN 4.6 10/17/2021   BILITOT 0.7 10/17/2021   ALKPHOS 82 10/17/2021   AST 26 10/17/2021   ALT 31 10/17/2021   Last lipids Lab Results  Component Value Date   CHOL 154 10/17/2021   HDL 57.30 10/17/2021   LDLCALC 65 10/17/2021   TRIG 159.0 (H) 10/17/2021   CHOLHDL 3 10/17/2021   Last hemoglobin A1c No results found for: "HGBA1C" Last thyroid functions Lab Results  Component Value Date   TSH 1.57 10/17/2021      The ASCVD Risk score (Arnett DK, et al., 2019) failed to calculate for the following reasons:   The 2019 ASCVD risk score is only valid for ages 45 to 10    Assessment & Plan:   #1 hypertension poorly controlled.  She is currently on regimen of amlodipine 5 mg daily and losartan/HCTZ 100/12.5 mg 1 daily. -Watch sodium intake closely -Start metoprolol succinate 25 mg once daily -Set up 1 month follow-up to reassess blood pressure. -If not closer to goal that time could consider either further titration of amlodipine versus additional medication such as Aldactone  #2 recent foot rash.  Question of tenia pedis.  Refill triamcinolone 2% cream to use twice daily as needed.  Feet as dry as possible  #3 situational stress.  We discussed her considerable stress issues at home.  Her situation is.  stressed with regard to taking care of her sister who has advanced dementia.  She is considering other care options.  We did suggest she discuss with social worker to look at potential options for possible placement at some point  Evelena Peat, MD

## 2022-08-24 ENCOUNTER — Encounter (INDEPENDENT_AMBULATORY_CARE_PROVIDER_SITE_OTHER): Payer: Medicare PPO | Admitting: Ophthalmology

## 2022-08-25 ENCOUNTER — Other Ambulatory Visit: Payer: Self-pay | Admitting: Family Medicine

## 2022-09-17 ENCOUNTER — Ambulatory Visit: Payer: Medicare PPO | Admitting: Family Medicine

## 2022-09-17 ENCOUNTER — Encounter: Payer: Self-pay | Admitting: Family Medicine

## 2022-09-17 VITALS — BP 132/60 | HR 76 | Temp 97.9°F | Ht 62.0 in | Wt 156.0 lb

## 2022-09-17 DIAGNOSIS — E785 Hyperlipidemia, unspecified: Secondary | ICD-10-CM | POA: Diagnosis not present

## 2022-09-17 DIAGNOSIS — I10 Essential (primary) hypertension: Secondary | ICD-10-CM | POA: Diagnosis not present

## 2022-09-17 MED ORDER — KETOCONAZOLE 2 % EX CREA: 1.0000 | TOPICAL_CREAM | Freq: Every day | CUTANEOUS | 1 refills | Status: AC

## 2022-09-17 NOTE — Progress Notes (Signed)
Established Patient Office Visit  Subjective   Patient ID: Cassandra Foster, female    DOB: 04/11/1940  Age: 82 y.o. MRN: [id number]  Chief Complaint  Patient presents with   Medical Management of Chronic Issues    HPI   [name] seen for follow-up hypertension.  We added Toprol-XL 25 mg daily to her amlodipine 5 mg and losartan/HCTZ 100/12.5 mg daily.  She states her blood pressures are still somewhat up-and-down by home readings.  Still has several systolic readings around 140.  No recent headaches or dizziness.  No chest pains.  As mentioned previously, she has tremendous stress issues with taking care of her husband as well as the son with increased needs and her sister has dementia who lives with them.  She did have 1 episode recently where she woke up in the middle the night with what felt like increased heart rate.  She felt this was a regular rhythm.  Lasted about 2 minutes.  No associated diaphoresis or chest pains.  No episodes since then.  No known history of atrial fibrillation.  Past Medical History:  Diagnosis Date   Chronic insomnia    Colon polyps    Hyperlipidemia    Hypertension    Osteoporosis    Past Surgical History:  Procedure Laterality Date   BREAST EXCISIONAL BIOPSY Left    benign, lump removed many years ago   BREAST SURGERY     1985, 1997   COLONOSCOPY  multiple   KNEE SURGERY  2009   kneecap   OTHER SURGICAL HISTORY     childbirth x 3   TUBAL LIGATION     BP dropped     reports that she has never smoked. She has never used smokeless tobacco. She reports that she does not drink alcohol and does not use drugs. family history includes Breast cancer in her mother; Cancer in her mother; Colon cancer (age of onset: 17) in her brother; Colon cancer (age of onset: 72) in an other family member; Prostate cancer in her brother; Stroke in her father. Allergies  Allergen Reactions   Follow-Up [Calcilo Xd]    Simvastatin Other (See Comments)    Body aches     Review of Systems  Constitutional:  Negative for chills, fever and malaise/fatigue.  Eyes:  Negative for blurred vision.  Respiratory:  Negative for shortness of breath.   Cardiovascular:  Negative for chest pain and leg swelling.  Neurological:  Negative for dizziness, weakness and headaches.      Objective:     BP 132/60 (BP Location: Left Arm, Patient Position: Sitting, Cuff Size: Normal)   Pulse 76   Temp 97.9 F (36.6 C) (Oral)   Ht 5\' 2"  (1.575 m)   Wt 156 lb (70.8 kg)   SpO2 98%   BMI 28.53 kg/m  BP Readings from Last 3 Encounters:  09/17/22 132/60  08/17/22 (!) 154/66  04/04/22 136/80   Wt Readings from Last 3 Encounters:  09/17/22 156 lb (70.8 kg)  08/17/22 156 lb 11.2 oz (71.1 kg)  04/04/22 162 lb (73.5 kg)      Physical Exam Vitals reviewed.  Constitutional:      General: She is not in acute distress.    Appearance: Normal appearance.  Cardiovascular:     Rate and Rhythm: Normal rate and regular rhythm.  Pulmonary:     Effort: Pulmonary effort is normal.     Breath sounds: Normal breath sounds. No wheezing or rales.  Musculoskeletal:  Right lower leg: No edema.     Left lower leg: No edema.  Neurological:     Mental Status: She is alert.      No results found for any visits on 09/17/22.  Last CBC Lab Results  Component Value Date   WBC 8.1 10/17/2021   HGB 13.8 10/17/2021   HCT 40.7 10/17/2021   MCV 93.0 10/17/2021   MCH 31.7 04/06/2013   RDW 13.3 10/17/2021   PLT 158.0 10/17/2021   Last metabolic panel Lab Results  Component Value Date   GLUCOSE 94 10/17/2021   NA 131 (L) 10/17/2021   K 3.7 10/17/2021   CL 96 10/17/2021   CO2 22 10/17/2021   BUN 12 10/17/2021   CREATININE 0.65 10/17/2021   GFR 82.58 10/17/2021   CALCIUM 10.2 10/17/2021   PROT 7.4 10/17/2021   ALBUMIN 4.6 10/17/2021   BILITOT 0.7 10/17/2021   ALKPHOS 82 10/17/2021   AST 26 10/17/2021   ALT 31 10/17/2021   Last lipids Lab Results  Component Value  Date   CHOL 154 10/17/2021   HDL 57.30 10/17/2021   LDLCALC 65 10/17/2021   TRIG 159.0 (H) 10/17/2021   CHOLHDL 3 10/17/2021   Last hemoglobin A1c No results found for: "HGBA1C" Last thyroid functions Lab Results  Component Value Date   TSH 1.57 10/17/2021      The ASCVD Risk score (Arnett DK, et al., 2019) failed to calculate for the following reasons:   The 2019 ASCVD risk score is only valid for ages 75 to 67    Assessment & Plan:   #1 hypertension improved on readings here.  Recent addition of low-dose Toprol-XL to losartan HCTZ and amlodipine.  Reemphasized importance of low-sodium diet and weight control.  She will continue close monitoring for now.  #2 hyperlipidemia treated with pravastatin 20 mg daily.  Will need follow-up labs next visit in a few months.  Plan to check lipid, hepatic, basic metabolic panel at that time.  Evelena Peat, MD

## 2022-09-26 ENCOUNTER — Encounter (INDEPENDENT_AMBULATORY_CARE_PROVIDER_SITE_OTHER): Payer: Self-pay

## 2022-10-08 ENCOUNTER — Other Ambulatory Visit: Payer: Self-pay | Admitting: Family Medicine

## 2022-10-23 ENCOUNTER — Other Ambulatory Visit: Payer: Self-pay

## 2022-10-23 ENCOUNTER — Emergency Department (HOSPITAL_COMMUNITY): Payer: Medicare PPO

## 2022-10-23 ENCOUNTER — Inpatient Hospital Stay (HOSPITAL_COMMUNITY)
Admission: EM | Admit: 2022-10-23 | Discharge: 2022-10-25 | DRG: 640 | Disposition: A | Discharge status: Home / Self Care | Payer: Medicare PPO | Attending: Internal Medicine | Admitting: Internal Medicine

## 2022-10-23 ENCOUNTER — Encounter (HOSPITAL_COMMUNITY): Payer: Self-pay | Admitting: Pharmacy Technician

## 2022-10-23 DIAGNOSIS — Z800 Family history of malignant neoplasm of digestive organs: Secondary | ICD-10-CM

## 2022-10-23 DIAGNOSIS — E876 Hypokalemia: Secondary | ICD-10-CM | POA: Diagnosis present

## 2022-10-23 DIAGNOSIS — R197 Diarrhea, unspecified: Secondary | ICD-10-CM | POA: Diagnosis present

## 2022-10-23 DIAGNOSIS — E861 Hypovolemia: Secondary | ICD-10-CM | POA: Diagnosis not present

## 2022-10-23 DIAGNOSIS — I959 Hypotension, unspecified: Secondary | ICD-10-CM | POA: Diagnosis present

## 2022-10-23 DIAGNOSIS — E871 Hypo-osmolality and hyponatremia: Secondary | ICD-10-CM | POA: Diagnosis not present

## 2022-10-23 DIAGNOSIS — Z7982 Long term (current) use of aspirin: Secondary | ICD-10-CM | POA: Diagnosis not present

## 2022-10-23 DIAGNOSIS — G9341 Metabolic encephalopathy: Secondary | ICD-10-CM | POA: Diagnosis not present

## 2022-10-23 DIAGNOSIS — R1111 Vomiting without nausea: Secondary | ICD-10-CM | POA: Diagnosis not present

## 2022-10-23 DIAGNOSIS — Z79899 Other long term (current) drug therapy: Secondary | ICD-10-CM

## 2022-10-23 DIAGNOSIS — R112 Nausea with vomiting, unspecified: Secondary | ICD-10-CM | POA: Diagnosis present

## 2022-10-23 DIAGNOSIS — Z7983 Long term (current) use of bisphosphonates: Secondary | ICD-10-CM

## 2022-10-23 DIAGNOSIS — E785 Hyperlipidemia, unspecified: Secondary | ICD-10-CM | POA: Diagnosis present

## 2022-10-23 DIAGNOSIS — R140 Abdominal distension (gaseous): Secondary | ICD-10-CM | POA: Diagnosis not present

## 2022-10-23 DIAGNOSIS — Z636 Dependent relative needing care at home: Secondary | ICD-10-CM

## 2022-10-23 DIAGNOSIS — F419 Anxiety disorder, unspecified: Secondary | ICD-10-CM | POA: Diagnosis present

## 2022-10-23 DIAGNOSIS — Z888 Allergy status to other drugs, medicaments and biological substances status: Secondary | ICD-10-CM

## 2022-10-23 DIAGNOSIS — Z803 Family history of malignant neoplasm of breast: Secondary | ICD-10-CM | POA: Diagnosis not present

## 2022-10-23 DIAGNOSIS — G9340 Encephalopathy, unspecified: Secondary | ICD-10-CM

## 2022-10-23 DIAGNOSIS — Z823 Family history of stroke: Secondary | ICD-10-CM | POA: Diagnosis not present

## 2022-10-23 DIAGNOSIS — M810 Age-related osteoporosis without current pathological fracture: Secondary | ICD-10-CM | POA: Diagnosis present

## 2022-10-23 DIAGNOSIS — R4182 Altered mental status, unspecified: Secondary | ICD-10-CM | POA: Diagnosis not present

## 2022-10-23 DIAGNOSIS — R110 Nausea: Secondary | ICD-10-CM | POA: Diagnosis not present

## 2022-10-23 DIAGNOSIS — Z8042 Family history of malignant neoplasm of prostate: Secondary | ICD-10-CM | POA: Diagnosis not present

## 2022-10-23 DIAGNOSIS — Z6379 Other stressful life events affecting family and household: Secondary | ICD-10-CM

## 2022-10-23 DIAGNOSIS — Z86010 Personal history of colonic polyps: Secondary | ICD-10-CM | POA: Diagnosis not present

## 2022-10-23 DIAGNOSIS — I10 Essential (primary) hypertension: Secondary | ICD-10-CM | POA: Diagnosis present

## 2022-10-23 DIAGNOSIS — R410 Disorientation, unspecified: Secondary | ICD-10-CM

## 2022-10-23 LAB — MRSA NEXT GEN BY PCR, NASAL: MRSA by PCR Next Gen: NOT DETECTED

## 2022-10-23 LAB — RAPID URINE DRUG SCREEN, HOSP PERFORMED
Amphetamines: NOT DETECTED
Barbiturates: NOT DETECTED
Benzodiazepines: NOT DETECTED
Cocaine: NOT DETECTED
Opiates: NOT DETECTED
Tetrahydrocannabinol: NOT DETECTED

## 2022-10-23 LAB — BASIC METABOLIC PANEL
Anion gap: 10 (ref 5–15)
BUN: 14 mg/dL (ref 8–23)
CO2: 20 mmol/L — ABNORMAL LOW (ref 22–32)
Calcium: 8.6 mg/dL — ABNORMAL LOW (ref 8.9–10.3)
Chloride: 98 mmol/L (ref 98–111)
Creatinine, Ser: 0.51 mg/dL (ref 0.44–1.00)
GFR, Estimated: >60 mL/min (ref >60)
Glucose, Bld: 120 mg/dL — ABNORMAL HIGH (ref 70–99)
Potassium: 3.7 mmol/L (ref 3.5–5.1)
Sodium: 128 mmol/L — ABNORMAL LOW (ref 135–145)

## 2022-10-23 LAB — OSMOLALITY: Osmolality: 250 mOsm/kg — ABNORMAL LOW (ref 275–295)

## 2022-10-23 LAB — CBC WITH DIFFERENTIAL/PLATELET
Abs Immature Granulocytes: 0.04 10*3/uL (ref 0.00–0.07)
Basophils Absolute: 0 10*3/uL (ref 0.0–0.1)
Basophils Relative: 0 %
Eosinophils Absolute: 0 10*3/uL (ref 0.0–0.5)
Eosinophils Relative: 0 %
HCT: 33.7 % — ABNORMAL LOW (ref 36.0–46.0)
Hemoglobin: 12.2 g/dL (ref 12.0–15.0)
Immature Granulocytes: 1 %
Lymphocytes Relative: 18 %
Lymphs Abs: 1.5 10*3/uL (ref 0.7–4.0)
MCH: 31.7 pg (ref 26.0–34.0)
MCHC: 36.2 g/dL — ABNORMAL HIGH (ref 30.0–36.0)
MCV: 87.5 fL (ref 80.0–100.0)
Monocytes Absolute: 0.6 10*3/uL (ref 0.1–1.0)
Monocytes Relative: 7 %
Neutro Abs: 6.1 10*3/uL (ref 1.7–7.7)
Neutrophils Relative %: 74 %
Platelets: 131 10*3/uL — ABNORMAL LOW (ref 150–400)
RBC: 3.85 MIL/uL — ABNORMAL LOW (ref 3.87–5.11)
RDW: 11.9 % (ref 11.5–15.5)
WBC: 8.3 10*3/uL (ref 4.0–10.5)
nRBC: 0 % (ref 0.0–0.2)

## 2022-10-23 LAB — URINALYSIS, ROUTINE W REFLEX MICROSCOPIC
Bilirubin Urine: NEGATIVE
Glucose, UA: NEGATIVE mg/dL
Hgb urine dipstick: NEGATIVE
Ketones, ur: NEGATIVE mg/dL
Leukocytes,Ua: NEGATIVE
Nitrite: NEGATIVE
Protein, ur: NEGATIVE mg/dL
Specific Gravity, Urine: 1.012 (ref 1.005–1.030)
pH: 7 (ref 5.0–8.0)

## 2022-10-23 LAB — OSMOLALITY, URINE: Osmolality, Ur: 492 mOsm/kg (ref 300–900)

## 2022-10-23 LAB — SODIUM
Sodium: 113 mmol/L — CL (ref 135–145)
Sodium: 116 mmol/L — CL (ref 135–145)
Sodium: 126 mmol/L — ABNORMAL LOW (ref 135–145)

## 2022-10-23 LAB — NA AND K (SODIUM & POTASSIUM), RAND UR
Potassium Urine: 70 mmol/L
Sodium, Ur: 108 mmol/L

## 2022-10-23 LAB — LIPASE, BLOOD: Lipase: 48 U/L (ref 11–51)

## 2022-10-23 LAB — AMMONIA: Ammonia: 21 umol/L (ref 9–35)

## 2022-10-23 LAB — MAGNESIUM: Magnesium: 1.5 mg/dL — ABNORMAL LOW (ref 1.7–2.4)

## 2022-10-23 LAB — ETHANOL: Alcohol, Ethyl (B): <10 mg/dL (ref <10)

## 2022-10-23 MED ORDER — ASPIRIN 81 MG PO TBEC
81.0000 mg | DELAYED_RELEASE_TABLET | Freq: Every day | ORAL | Status: DC
  Administered 2022-10-23 – 2022-10-25 (×3): 81 mg via ORAL
  Filled 2022-10-23 (×3): qty 1

## 2022-10-23 MED ORDER — ACETAMINOPHEN 650 MG RE SUPP: 650.0000 mg | Freq: Four times a day (QID) | RECTAL | Status: DC | PRN

## 2022-10-23 MED ORDER — SODIUM CHLORIDE 3 % IV SOLN
INTRAVENOUS | Status: DC
  Filled 2022-10-23 (×2): qty 500

## 2022-10-23 MED ORDER — BISACODYL 5 MG PO TBEC: 5.0000 mg | DELAYED_RELEASE_TABLET | Freq: Every day | ORAL | Status: DC | PRN

## 2022-10-23 MED ORDER — DEXTROSE-NACL 5-0.45 % IV SOLN
INTRAVENOUS | Status: DC
  Filled 2022-10-23: qty 1000

## 2022-10-23 MED ORDER — ACETAMINOPHEN 325 MG PO TABS
650.0000 mg | ORAL_TABLET | Freq: Four times a day (QID) | ORAL | Status: DC | PRN
  Administered 2022-10-23: 650 mg via ORAL
  Filled 2022-10-23: qty 2

## 2022-10-23 MED ORDER — SODIUM CHLORIDE 3 % IV BOLUS
150.0000 mL | Freq: Once | INTRAVENOUS | Status: DC
Start: 2022-10-23 — End: 2022-10-23

## 2022-10-23 MED ORDER — DEXTROSE-SODIUM CHLORIDE 5-0.45 % IV SOLN: INTRAVENOUS | Status: DC

## 2022-10-23 MED ORDER — POTASSIUM CHLORIDE 10 MEQ/100ML IV SOLN
10.0000 meq | INTRAVENOUS | Status: AC
  Administered 2022-10-23 (×3): 10 meq via INTRAVENOUS
  Filled 2022-10-23 (×3): qty 100

## 2022-10-23 MED ORDER — MAGNESIUM SULFATE 2 GM/50ML IV SOLN
2.0000 g | Freq: Once | INTRAVENOUS | Status: AC
  Administered 2022-10-23: 2 g via INTRAVENOUS
  Filled 2022-10-23: qty 50

## 2022-10-23 MED ORDER — CHLORHEXIDINE GLUCONATE CLOTH 2 % EX PADS
6.0000 | MEDICATED_PAD | Freq: Every day | CUTANEOUS | Status: DC
  Administered 2022-10-24: 6 via TOPICAL

## 2022-10-23 MED ORDER — ONDANSETRON HCL 4 MG/2ML IJ SOLN: 4.0000 mg | Freq: Four times a day (QID) | INTRAMUSCULAR | Status: DC | PRN

## 2022-10-23 MED ORDER — ONDANSETRON HCL 4 MG PO TABS: 4.0000 mg | ORAL_TABLET | Freq: Four times a day (QID) | ORAL | Status: DC | PRN

## 2022-10-23 MED ORDER — ONDANSETRON HCL 4 MG/2ML IJ SOLN
4.0000 mg | Freq: Once | INTRAMUSCULAR | Status: AC
  Administered 2022-10-23: 4 mg via INTRAVENOUS
  Filled 2022-10-23: qty 2

## 2022-10-23 MED ORDER — METOPROLOL SUCCINATE ER 25 MG PO TB24
25.0000 mg | ORAL_TABLET | Freq: Every day | ORAL | Status: DC
  Administered 2022-10-24 – 2022-10-25 (×2): 25 mg via ORAL
  Filled 2022-10-23 (×2): qty 1

## 2022-10-23 MED ORDER — AMLODIPINE BESYLATE 5 MG PO TABS
5.0000 mg | ORAL_TABLET | Freq: Every day | ORAL | Status: DC
  Administered 2022-10-23 – 2022-10-25 (×3): 5 mg via ORAL
  Filled 2022-10-23 (×3): qty 1

## 2022-10-23 MED ORDER — HEPARIN SODIUM (PORCINE) 5000 UNIT/ML IJ SOLN
5000.0000 [IU] | Freq: Three times a day (TID) | INTRAMUSCULAR | Status: DC
  Administered 2022-10-23 – 2022-10-25 (×5): 5000 [IU] via SUBCUTANEOUS
  Filled 2022-10-23 (×5): qty 1

## 2022-10-23 MED ORDER — DEXTROSE-NACL 5-0.45 % IV SOLN
INTRAVENOUS | Status: AC
  Administered 2022-10-24: 50 mL/h via INTRAVENOUS
  Filled 2022-10-23: qty 1000

## 2022-10-23 MED ORDER — SODIUM CHLORIDE 0.9 % IV BOLUS
1000.0000 mL | Freq: Once | INTRAVENOUS | Status: AC
  Administered 2022-10-23: 1000 mL via INTRAVENOUS

## 2022-10-23 MED ORDER — POTASSIUM CHLORIDE 20 MEQ PO PACK
40.0000 meq | PACK | Freq: Once | ORAL | Status: AC
  Administered 2022-10-23: 40 meq via ORAL
  Filled 2022-10-23: qty 2

## 2022-10-23 MED ORDER — DEXTROSE-SODIUM CHLORIDE 5-0.9 % IV SOLN: INTRAVENOUS | Status: DC

## 2022-10-23 MED ORDER — IOHEXOL 300 MG/ML  SOLN
100.0000 mL | Freq: Once | INTRAMUSCULAR | Status: AC | PRN
  Administered 2022-10-23: 100 mL via INTRAVENOUS

## 2022-10-23 NOTE — ED Notes (Signed)
MD notified of sodium and potassium results.

## 2022-10-23 NOTE — ED Notes (Signed)
Pt assisted on bedpan and off, bedpan overfilled and bed linens wet; pt's linens changed and pt put in a gown; pt has no complaints at this time

## 2022-10-23 NOTE — ED Notes (Signed)
Pt assisted to bed pan and off; pt repositioned and has no other complaints at this time

## 2022-10-23 NOTE — H&P (Addendum)
History and Physical    Patient: Cassandra Foster EPP:[id number] DOB: 1940-06-21 DOA: 10/23/2022 DOS: the patient was seen and examined on 10/23/2022 PCP: Kristian Covey, MD  Patient coming from: Home  Chief Complaint:  Chief Complaint  Patient presents with   Emesis   HPI: ARIANNIE Foster is a 82 y.o. female with medical history significant for hypertension, hyperlipidemia and osteoporosis presents because of nausea, vomiting, and diarrhea.  The patient provides the history and she is having difficulty with some confusion but she reports that the nausea, vomiting, and diarrhea started today. She says that her husband was recently in [facility] and he was very agitated and confused and his blood pressure was dangerously low.  She has been checking her own blood pressure quite often because she did not want that to happen to her.  2 nights ago she was up all night and had some chest discomfort.  She says her blood pressure also was low.  She says the lowest blood pressure that she got was a systolic of 96.  He does take her blood pressure medicines at night before going to bed.  This blood pressure really worried her mainly because of her husband's recent hospitalization.  She spent the next day aggressively trying to get her blood pressure up.  She was putting salt see salt in warm water and drinking that.  She did that at least 4 times.  She drank a lot of Gatorade.  She was also drinking lots of water.  It sounds like her blood pressure did not come up substantially which really worried her so she kept drinking more.  This morning is when she started to feel nauseated and to throw up and that is when she came in for evaluation. Her evaluation in the emergency department included a CT scan of her head which was unremarkable.  Her blood work revealed a sodium of 114.  Though the patient is awake and alert and sitting up in bed and conversational she is confused and has difficulty staying on  topic.  She was started on hypertonic saline in the emergency department and we are asked to admit for continued treatment and monitoring.  The patient provides care for her sister with Alzheimer's, her disabled son, and her husband so she is under constant stress.    Review of Systems: unable to review all systems due to the inability of the patient to answer questions. Past Medical History:  Diagnosis Date   Chronic insomnia    Colon polyps    Hyperlipidemia    Hypertension    Osteoporosis    Past Surgical History:  Procedure Laterality Date   BREAST EXCISIONAL BIOPSY Left    benign, lump removed many years ago   BREAST SURGERY     1985, 1997   COLONOSCOPY  multiple   KNEE SURGERY  2009   kneecap   OTHER SURGICAL HISTORY     childbirth x 3   TUBAL LIGATION     BP dropped    Social History:  reports that she has never smoked. She has never used smokeless tobacco. She reports that she does not drink alcohol and does not use drugs.  Allergies  Allergen Reactions   Follow-Up [Calcilo Xd]    Simvastatin Other (See Comments)    Body aches    Family History  Problem Relation Age of Onset   Cancer Mother        breast   Breast cancer Mother  Stroke Father    Colon cancer Brother 85   Prostate cancer Brother    Colon cancer Other 19    Prior to Admission medications   Medication Sig Start Date End Date Taking? Authorizing Provider  amLODipine (NORVASC) 5 MG tablet TAKE ONE TABLET DAILY 10/10/22  Yes Burchette, Elberta Fortis, MD  aspirin EC 81 MG tablet Take 81 mg by mouth daily.   Yes [provider]  Cholecalciferol (VITAMIN D3) 5000 UNITS CAPS Take 1 capsule by mouth daily.   Yes [provider]  Clobetasol Propionate Emulsion (OLUX-E) 0.05 % topical foam Apply 1 Application topically 2 (two) times daily. 08/28/21  Yes Burchette, Elberta Fortis, MD  denosumab (PROLIA) 60 MG/ML SOSY injection Inject 60 mg into the skin every 6 (six) months.   Yes [provider]  diclofenac Sodium (VOLTAREN) 1 % GEL Apply 4 g topically 4 (four) times daily. To affected joint. 01/08/19  Yes Rodolph Bong, MD  ketoconazole (NIZORAL) 2 % cream Apply 1 Application topically daily. 09/17/22  Yes Burchette, Elberta Fortis, MD  losartan-hydrochlorothiazide [facility]) 100-12.5 MG tablet TAKE 1 TABLET DAILY 08/27/22  Yes Burchette, Elberta Fortis, MD  metoprolol succinate (TOPROL-XL) 25 MG 24 hr tablet Take 1 tablet (25 mg total) by mouth daily. 08/17/22  Yes Burchette, Elberta Fortis, MD  mometasone (ELOCON) 0.1 % lotion APPLY TOPICALLY ONCE A DAY AS DIRECTED 11/25/19  Yes Burchette, Elberta Fortis, MD  Multiple Vitamins-Minerals (PRESERVISION AREDS 2 PO) Take by mouth daily.   Yes [provider]  pravastatin (PRAVACHOL) 20 MG tablet TAKE 1 TABLET DAILY 05/24/22  Yes Burchette, Elberta Fortis, MD  traZODone (DESYREL) 50 MG tablet TAKE TWO TABLETS BY MOUTH AT BEDTIME 02/23/22  Yes Burchette, Elberta Fortis, MD  albuterol (VENTOLIN HFA) 108 (90 Base) MCG/ACT inhaler Inhale 2 puffs into the lungs every 4 (four) hours as needed for wheezing or shortness of breath. Patient not taking: Reported on 10/23/2022 04/05/21   Kristian Covey, MD    Physical Exam: Vitals:   10/23/22 1407 10/23/22 1415 10/23/22 1430 10/23/22 1730  BP: 137/79 (!) 138/57 (!) 125/54 130/75  Pulse: 72 78 73 76  Resp:  13 12 13   Temp:      SpO2: 96% 97% 98% 98%   Physical Exam:  General: No acute distress, well developed, well nourished HEENT: Normocephalic, atraumatic, PERRL Cardiovascular: Normal rate and rhythm. Distal pulses intact. Pulmonary: Normal pulmonary effort, normal breath sounds Gastrointestinal: Nondistended abdomen, soft, non-tender, normoactive bowel sounds, no organomegaly Musculoskeletal:Normal ROM, no lower ext edema Skin: Skin is warm and dry. Neuro: Moves all extremities symmetrically, AAOx name and place PSYCH: She is awake sitting up in bed and talkative but she loses track of what she is saying  frequently.  She has difficulty with dates.  She gets confused explaining what happened yesterday versus 2 days ago.  She seems anxious.  She has trouble sticking to the topic.  Data Reviewed:  Results for orders placed or performed during the hospital encounter of 10/23/22 (from the past 24 hour(s))  Comprehensive metabolic panel     Status: Abnormal   Collection Time: 10/23/22 11:26 AM  Result Value Ref Range   Sodium 114 (LL) 135 - 145 mmol/L   Potassium 2.6 (LL) 3.5 - 5.1 mmol/L   Chloride 81 (L) 98 - 111 mmol/L   CO2 23 22 - 32 mmol/L   Glucose, Bld 153 (H) 70 - 99 mg/dL   BUN 10 8 - 23 mg/dL  Creatinine, Ser 0.52 0.44 - 1.00 mg/dL   Calcium 8.3 (L) 8.9 - 10.3 mg/dL   Total Protein 6.5 6.5 - 8.1 g/dL   Albumin 3.9 3.5 - 5.0 g/dL   AST 35 15 - 41 U/L   ALT 47 (H) 0 - 44 U/L   Alkaline Phosphatase 73 38 - 126 U/L   Total Bilirubin 1.1 0.3 - 1.2 mg/dL   GFR, Estimated >24 >40 mL/min   Anion gap 10 5 - 15  Lipase, blood     Status: None   Collection Time: 10/23/22 11:26 AM  Result Value Ref Range   Lipase 48 11 - 51 U/L  CBC with Diff     Status: Abnormal   Collection Time: 10/23/22 11:26 AM  Result Value Ref Range   WBC 8.3 4.0 - 10.5 K/uL   RBC 3.85 (L) 3.87 - 5.11 MIL/uL   Hemoglobin 12.2 12.0 - 15.0 g/dL   HCT 10.2 (L) 72.5 - 36.6 %   MCV 87.5 80.0 - 100.0 fL   MCH 31.7 26.0 - 34.0 pg   MCHC 36.2 (H) 30.0 - 36.0 g/dL   RDW 44.0 34.7 - 42.5 %   Platelets 131 (L) 150 - 400 K/uL   nRBC 0.0 0.0 - 0.2 %   Neutrophils Relative % 74 %   Neutro Abs 6.1 1.7 - 7.7 K/uL   Lymphocytes Relative 18 %   Lymphs Abs 1.5 0.7 - 4.0 K/uL   Monocytes Relative 7 %   Monocytes Absolute 0.6 0.1 - 1.0 K/uL   Eosinophils Relative 0 %   Eosinophils Absolute 0.0 0.0 - 0.5 K/uL   Basophils Relative 0 %   Basophils Absolute 0.0 0.0 - 0.1 K/uL   Immature Granulocytes 1 %   Abs Immature Granulocytes 0.04 0.00 - 0.07 K/uL  Ammonia     Status: None   Collection Time: 10/23/22 11:26 AM   Result Value Ref Range   Ammonia 21 9 - 35 umol/L  Ethanol     Status: None   Collection Time: 10/23/22 11:26 AM  Result Value Ref Range   Alcohol, Ethyl (B) <10 <10 mg/dL  Osmolality, urine     Status: None   Collection Time: 10/23/22 12:55 PM  Result Value Ref Range   Osmolality, Ur 492 300 - 900 mOsm/kg  Na and K (sodium & potassium), rand urine     Status: None   Collection Time: 10/23/22 12:55 PM  Result Value Ref Range   Sodium, Ur 108 mmol/L   Potassium Urine 70 mmol/L  Urinalysis, Routine w reflex microscopic -Urine, Clean Catch     Status: None   Collection Time: 10/23/22  1:07 PM  Result Value Ref Range   Color, Urine YELLOW YELLOW   APPearance CLEAR CLEAR   Specific Gravity, Urine 1.012 1.005 - 1.030   pH 7.0 5.0 - 8.0   Glucose, UA NEGATIVE NEGATIVE mg/dL   Hgb urine dipstick NEGATIVE NEGATIVE   Bilirubin Urine NEGATIVE NEGATIVE   Ketones, ur NEGATIVE NEGATIVE mg/dL   Protein, ur NEGATIVE NEGATIVE mg/dL   Nitrite NEGATIVE NEGATIVE   Leukocytes,Ua NEGATIVE NEGATIVE  Rapid urine drug screen (hospital performed)     Status: None   Collection Time: 10/23/22  1:07 PM  Result Value Ref Range   Opiates NONE DETECTED NONE DETECTED   Cocaine NONE DETECTED NONE DETECTED   Benzodiazepines NONE DETECTED NONE DETECTED   Amphetamines NONE DETECTED NONE DETECTED   Tetrahydrocannabinol NONE DETECTED NONE DETECTED   Barbiturates NONE  DETECTED NONE DETECTED  sodium     Status: Abnormal   Collection Time: 10/23/22  1:42 PM  Result Value Ref Range   Sodium 113 (LL) 135 - 145 mmol/L  Osmolality     Status: Abnormal   Collection Time: 10/23/22  1:42 PM  Result Value Ref Range   Osmolality 250 (L) 275 - 295 mOsm/kg  Magnesium     Status: Abnormal   Collection Time: 10/23/22  1:42 PM  Result Value Ref Range   Magnesium 1.5 (L) 1.7 - 2.4 mg/dL  Sodium     Status: Abnormal   Collection Time: 10/23/22  3:40 PM  Result Value Ref Range   Sodium 116 (LL) 135 - 145 mmol/L      Assessment and Plan: Hyponatremia -hypertonic saline with every 4 hours sodiums Hypotension in patient with history of hypertension -she was recently started on HCTZ which we will discontinue.  She also may want to consider taking her medication in the morning instead of at night.  Her systolic blood pressure was low per her report but she was asymptomatic.  Her husband's recent episode of hypotension has caused her outsized anxiety.  She is checking her blood pressure too much and worrying about it being either too high or too low. Anxiety management -  needed Hypokalemia - replace    Advance Care Planning:   Code Status: Full Code  The patient will full code by default as she does not have capacity at the moment.  Consults: none  Family Communication: none  Severity of Illness: The appropriate patient status for this patient is INPATIENT. Inpatient status is judged to be reasonable and necessary in order to provide the required intensity of service to ensure the patient's safety. The patient's presenting symptoms, physical exam findings, and initial radiographic and laboratory data in the context of their chronic comorbidities is felt to place them at high risk for further clinical deterioration. Furthermore, it is not anticipated that the patient will be medically stable for discharge from the hospital within 2 midnights of admission.   * I certify that at the point of admission it is my clinical judgment that the patient will require inpatient hospital care spanning beyond 2 midnights from the point of admission due to high intensity of service, high risk for further deterioration and high frequency of surveillance required.*  Author: Buena Irish, MD 10/23/2022 8:00 PM  For on call review [email or web address].

## 2022-10-23 NOTE — ED Triage Notes (Signed)
Pt here via ems from home with reports of feeling dehydrated last night. Ems reports pt drank gatorade and water, but then started having diarrhea. Today pt started vomiting. Given 4mg  zofran en route.

## 2022-10-23 NOTE — ED Notes (Signed)
Pt assisted on bedpan and off; underpads were changed due to overflow of bedpan

## 2022-10-23 NOTE — ED Notes (Signed)
ED TO INPATIENT HANDOFF REPORT  ED Nurse Name and Phone #:  Neldon Mc RN [phone]  S Name/Age/Gender Cassandra Foster 82 y.o. female Room/Bed: APA05/APA05  Code Status   Code Status: Full Code  Home/SNF/Other Home Patient oriented to: self, place, time, and situation Is this baseline? Yes   Triage Complete: Triage complete  Chief Complaint Hyponatremia [E87.1]  Triage Note Pt here via ems from home with reports of feeling dehydrated last night. Ems reports pt drank gatorade and water, but then started having diarrhea. Today pt started vomiting. Given 4mg  zofran en route.    Allergies Allergies  Allergen Reactions   Follow-Up [Calcilo Xd]    Simvastatin Other (See Comments)    Body aches    Level of Care/Admitting Diagnosis ED Disposition     ED Disposition  Admit   Condition  --   Comment  Hospital Area: [facility] [[id number]]  Level of Care: ICU [6]  Covid Evaluation: Asymptomatic - no recent exposure (last 10 days) testing not required  Diagnosis: Hyponatremia [098119]  Admitting Physician: Buena Irish [[id number]]  Attending Physician: Buena Irish [[id number]]  Certification:: I certify this patient will need inpatient services for at least 2 midnights  Expected Medical Readiness: 10/27/2022          B Medical/Surgery History Past Medical History:  Diagnosis Date   Chronic insomnia    Colon polyps    Hyperlipidemia    Hypertension    Osteoporosis    Past Surgical History:  Procedure Laterality Date   BREAST EXCISIONAL BIOPSY Left    benign, lump removed many years ago   BREAST SURGERY     1985, 1997   COLONOSCOPY  multiple   KNEE SURGERY  2009   kneecap   OTHER SURGICAL HISTORY     childbirth x 3   TUBAL LIGATION     BP dropped      A IV Location/Drains/Wounds Patient Lines/Drains/Airways Status     Active Line/Drains/Airways     Name Placement date Placement time Site Days   Peripheral IV 10/23/22 20 G 1" Right  Antecubital 10/23/22  1126  Antecubital  less than 1   Peripheral IV 10/23/22 20 G Left;Posterior Hand 10/23/22  1309  Hand  less than 1            Intake/Output Last 24 hours  Intake/Output Summary (Last 24 hours) at 10/23/2022 1811 Last data filed at 10/23/2022 1809 Gross per 24 hour  Intake 1100 ml  Output --  Net 1100 ml    Labs/Imaging Results for orders placed or performed during the hospital encounter of 10/23/22 (from the past 48 hour(s))  Comprehensive metabolic panel     Status: Abnormal   Collection Time: 10/23/22 11:26 AM  Result Value Ref Range   Sodium 114 (LL) 135 - 145 mmol/L    Comment: CRITICAL RESULT CALLED TO, READ BACK BY AND VERIFIED WITH BAIN,C AT 12:00PM ON 10/22/22 BY FESTERMAN,C   Potassium 2.6 (LL) 3.5 - 5.1 mmol/L    Comment: CRITICAL RESULT CALLED TO, READ BACK BY AND VERIFIED WITH  BAIN,C AT 12:00PM ON 10/22/22 BY FESTERMAN,C   Chloride 81 (L) 98 - 111 mmol/L   CO2 23 22 - 32 mmol/L   Glucose, Bld 153 (H) 70 - 99 mg/dL    Comment: Glucose reference range applies only to samples taken after fasting for at least 8 hours.   BUN 10 8 - 23 mg/dL   Creatinine, Ser 1.47 0.44 - 1.00  mg/dL   Calcium 8.3 (L) 8.9 - 10.3 mg/dL   Total Protein 6.5 6.5 - 8.1 g/dL   Albumin 3.9 3.5 - 5.0 g/dL   AST 35 15 - 41 U/L   ALT 47 (H) 0 - 44 U/L   Alkaline Phosphatase 73 38 - 126 U/L   Total Bilirubin 1.1 0.3 - 1.2 mg/dL   GFR, Estimated >16 >10 mL/min    Comment: (NOTE) Calculated using the CKD-EPI Creatinine Equation (2021)    Anion gap 10 5 - 15    Comment: Performed at [facility], [address] [place], [place] [address]  Lipase, blood     Status: None   Collection Time: 10/23/22 11:26 AM  Result Value Ref Range   Lipase 48 11 - 51 U/L    Comment: Performed at [facility], [address] [place], [place] [address]  CBC with Diff     Status: Abnormal   Collection Time: 10/23/22 11:26 AM  Result Value Ref Range   WBC 8.3 4.0 - 10.5 K/uL   RBC 3.85 (L)  3.87 - 5.11 MIL/uL   Hemoglobin 12.2 12.0 - 15.0 g/dL   HCT 19.1 (L) 47.8 - 29.5 %   MCV 87.5 80.0 - 100.0 fL   MCH 31.7 26.0 - 34.0 pg   MCHC 36.2 (H) 30.0 - 36.0 g/dL   RDW 62.1 30.8 - 65.7 %   Platelets 131 (L) 150 - 400 K/uL   nRBC 0.0 0.0 - 0.2 %   Neutrophils Relative % 74 %   Neutro Abs 6.1 1.7 - 7.7 K/uL   Lymphocytes Relative 18 %   Lymphs Abs 1.5 0.7 - 4.0 K/uL   Monocytes Relative 7 %   Monocytes Absolute 0.6 0.1 - 1.0 K/uL   Eosinophils Relative 0 %   Eosinophils Absolute 0.0 0.0 - 0.5 K/uL   Basophils Relative 0 %   Basophils Absolute 0.0 0.0 - 0.1 K/uL   Immature Granulocytes 1 %   Abs Immature Granulocytes 0.04 0.00 - 0.07 K/uL    Comment: Performed at [facility], [address], [place], [place] [address]  Ammonia     Status: None   Collection Time: 10/23/22 11:26 AM  Result Value Ref Range   Ammonia 21 9 - 35 umol/L    Comment: Performed at [facility], [address], [place], [place] [address]  Ethanol     Status: None   Collection Time: 10/23/22 11:26 AM  Result Value Ref Range   Alcohol, Ethyl (B) <10 <10 mg/dL    Comment: (NOTE) Lowest detectable limit for serum alcohol is 10 mg/dL.  For medical purposes only. Performed at [facility], [address] [place], [place] [address]   Osmolality, urine     Status: None   Collection Time: 10/23/22 12:55 PM  Result Value Ref Range   Osmolality, Ur 492 300 - 900 mOsm/kg    Comment: REPEATED TO VERIFY Performed at [facility], [address], [place], [place] [address]   Na and K (sodium & potassium), rand urine     Status: None   Collection Time: 10/23/22 12:55 PM  Result Value Ref Range   Sodium, Ur 108 mmol/L   Potassium Urine 70 mmol/L    Comment: Performed at [facility], [address] [place], [place] [address]  Urinalysis, Routine w reflex microscopic -Urine, Clean Catch     Status: None   Collection Time: 10/23/22  1:07 PM  Result Value Ref Range  Color, Urine YELLOW YELLOW    APPearance CLEAR CLEAR   Specific Gravity, Urine 1.012 1.005 - 1.030   pH 7.0 5.0 - 8.0   Glucose, UA NEGATIVE NEGATIVE mg/dL   Hgb urine dipstick NEGATIVE NEGATIVE   Bilirubin Urine NEGATIVE NEGATIVE   Ketones, ur NEGATIVE NEGATIVE mg/dL   Protein, ur NEGATIVE NEGATIVE mg/dL   Nitrite NEGATIVE NEGATIVE   Leukocytes,Ua NEGATIVE NEGATIVE    Comment: Performed at [facility], [address], [place], [place] [address]  Rapid urine drug screen (hospital performed)     Status: None   Collection Time: 10/23/22  1:07 PM  Result Value Ref Range   Opiates NONE DETECTED NONE DETECTED   Cocaine NONE DETECTED NONE DETECTED   Benzodiazepines NONE DETECTED NONE DETECTED   Amphetamines NONE DETECTED NONE DETECTED   Tetrahydrocannabinol NONE DETECTED NONE DETECTED   Barbiturates NONE DETECTED NONE DETECTED    Comment: (NOTE) DRUG SCREEN FOR MEDICAL PURPOSES ONLY.  IF CONFIRMATION IS NEEDED FOR ANY PURPOSE, NOTIFY LAB WITHIN 5 DAYS.  LOWEST DETECTABLE LIMITS FOR URINE DRUG SCREEN Drug Class                     Cutoff (ng/mL) Amphetamine and metabolites    1000 Barbiturate and metabolites    200 Benzodiazepine                 200 Opiates and metabolites        300 Cocaine and metabolites        300 THC                            50 Performed at [facility], [address], [place], [place] [address]   sodium     Status: Abnormal   Collection Time: 10/23/22  1:42 PM  Result Value Ref Range   Sodium 113 (LL) 135 - 145 mmol/L    Comment: CRITICAL RESULT CALLED TO, READ BACK BY AND VERIFIED WITH MEGAN WHITE @ 1414 ON 10/23/22 Riesa Pope Performed at [facility], [address], [place], [place] [address]   Osmolality     Status: Abnormal   Collection Time: 10/23/22  1:42 PM  Result Value Ref Range   Osmolality 250 (L) 275 - 295 mOsm/kg    Comment: REPEATED TO VERIFY Performed at [facility], [address], [place], [place] [address]   Magnesium     Status: Abnormal   Collection  Time: 10/23/22  1:42 PM  Result Value Ref Range   Magnesium 1.5 (L) 1.7 - 2.4 mg/dL    Comment: Performed at [facility], [address], [place], [place] [address]  Sodium     Status: Abnormal   Collection Time: 10/23/22  3:40 PM  Result Value Ref Range   Sodium 116 (LL) 135 - 145 mmol/L    Comment: CRITICAL RESULT CALLED TO, READ BACK BY AND VERIFIED WITH BAIN,C ON 10/23/22 AT 1630 BY LOY,C Performed at [facility], [address], [place], [place] [address]    CT Head Wo Contrast  Result Date: 10/23/2022 CLINICAL DATA:  Altered mental status.  Vomiting. EXAM: CT HEAD WITHOUT CONTRAST TECHNIQUE: Contiguous axial images were obtained from the base of the skull through the vertex without intravenous contrast. RADIATION DOSE REDUCTION: This exam was performed according to the departmental dose-optimization program which includes automated exposure control, adjustment of the mA and/or kV according to patient size and/or use of  iterative reconstruction technique. COMPARISON:  None Available. FINDINGS: Brain: No evidence of acute infarction, hemorrhage, hydrocephalus, extra-axial collection or mass lesion/mass effect. There is bilateral periventricular hypodensity, which is non-specific but most likely seen in the settings of microvascular ischemic changes. Mild in extent. Vascular: No hyperdense vessel or unexpected calcification. Skull: Normal. Negative for fracture or focal lesion. Sinuses/Orbits: No acute finding. Other: None. IMPRESSION: *No acute intracranial abnormality. Electronically Signed   By: Jules Schick M.D.   On: 10/23/2022 15:08   CT ABDOMEN PELVIS W CONTRAST  Result Date: 10/23/2022 CLINICAL DATA:  Diarrhea.  Vomiting.  Bowel obstruction suspected. EXAM: CT ABDOMEN AND PELVIS WITH CONTRAST TECHNIQUE: Multidetector CT imaging of the abdomen and pelvis was performed using the standard protocol following bolus administration of intravenous contrast. RADIATION DOSE REDUCTION: This exam  was performed according to the departmental dose-optimization program which includes automated exposure control, adjustment of the mA and/or kV according to patient size and/or use of iterative reconstruction technique. CONTRAST:  OMNIPAQUE IOHEXOL 300 MG/ML  SOLN COMPARISON:  None Available. FINDINGS: Lower chest: There are patchy atelectatic changes in the visualized lung bases. No overt consolidation. No pleural effusion. The heart is normal in size. No pericardial effusion. Hepatobiliary: The liver is normal in size. Non-cirrhotic configuration. No suspicious mass. No intrahepatic or extrahepatic bile duct dilation. No calcified gallstones. Normal gallbladder wall thickness. No pericholecystic inflammatory changes. Pancreas: Unremarkable. No pancreatic ductal dilatation or surrounding inflammatory changes. Spleen: Within normal limits. No focal lesion. Adrenals/Urinary Tract: Adrenal glands are unremarkable. No suspicious renal mass. No hydronephrosis. No renal or ureteric calculi. Unremarkable urinary bladder. Stomach/Bowel: Stomach is distended/fluid-filled. No disproportionate dilation of the small or large bowel loops. No evidence of abnormal bowel wall thickening or inflammatory changes. The appendix is unremarkable. Vascular/Lymphatic: No ascites or pneumoperitoneum. No abdominal or pelvic lymphadenopathy, by size criteria. No aneurysmal dilation of the major abdominal arteries. There are mild peripheral atherosclerotic vascular calcifications of the aorta and its major branches. Reproductive: The uterus is unremarkable. The ovaries are unremarkable. Other: There is a 2.0 x 2.3 cm soft tissue attenuation structure centered in the subcutaneous fat/skin in the right paramedian epigastric region, incompletely characterized on the current examination but favored to represent an epidermal inclusion cyst. There is a tiny fat containing umbilical hernia. The soft tissues and abdominal wall are otherwise  unremarkable. Musculoskeletal: No suspicious osseous lesions. There are mild multilevel degenerative changes in the visualized spine. IMPRESSION: 1. No bowel obstruction. No acute inflammatory process identified within the abdomen or pelvis. 2. Multiple other nonacute observations, as described above. Electronically Signed   By: Jules Schick M.D.   On: 10/23/2022 15:06    Pending Labs Unresulted Labs (From admission, onward)     Start     Ordered   10/24/22 0500  CBC  Tomorrow morning,   R        10/23/22 1647   10/23/22 1900  Sodium  Every 4 hours,   R (with STAT occurrences)      10/23/22 1644   10/23/22 1255  Urea nitrogen, urine  Once,   URGENT        10/23/22 1255            Vitals/Pain Today's Vitals   10/23/22 1407 10/23/22 1415 10/23/22 1430 10/23/22 1730  BP: 137/79 (!) 138/57 (!) 125/54 130/75  Pulse: 72 78 73 76  Resp:  13 12 13   Temp:      SpO2: 96% 97% 98% 98%  PainSc:  Isolation Precautions No active isolations  Medications Medications  sodium chloride (hypertonic) 3 % solution ( Intravenous Rate/Dose Change 10/23/22 1604)  heparin injection 5,000 Units (has no administration in time range)  acetaminophen (TYLENOL) tablet 650 mg (has no administration in time range)    Or  acetaminophen (TYLENOL) suppository 650 mg (has no administration in time range)  ondansetron (ZOFRAN) tablet 4 mg (has no administration in time range)    Or  ondansetron (ZOFRAN) injection 4 mg (has no administration in time range)  bisacodyl (DULCOLAX) EC tablet 5 mg (has no administration in time range)  ondansetron (ZOFRAN) injection 4 mg (4 mg Intravenous Given 10/23/22 1129)  sodium chloride 0.9 % bolus 1,000 mL (0 mLs Intravenous Stopped 10/23/22 1809)  potassium chloride 10 mEq in 100 mL IVPB (0 mEq Intravenous Stopped 10/23/22 1809)  potassium chloride (KLOR-CON) packet 40 mEq (40 mEq Oral Given 10/23/22 1406)  iohexol (OMNIPAQUE) 300 MG/ML solution 100 mL (100 mLs  Intravenous Contrast Given 10/23/22 1334)    Mobility walks with person assist     Focused Assessments    R Recommendations: See Admitting Provider Note  Report given to:  Greggory Stallion RN  Additional Notes:

## 2022-10-23 NOTE — ED Provider Notes (Signed)
[place] EMERGENCY DEPARTMENT AT [facility] Provider Note   CSN: [id number] Arrival date & time: 10/23/22  1041     History {Add pertinent medical, surgical, social history, OB history to HPI:1} Chief Complaint  Patient presents with   Emesis    Cassandra Foster is a 82 y.o. female.  82 year old female with history of hypertension and hyperlipidemia who presents to the emergency department with nausea and vomiting diarrhea.  Patient reports that last night she was feeling dehydrated and drank lots of Gatorade and water.  Cannot tell me why she felt dehydrated.  Says that she then started vomiting and having diarrhea.  Patient appears somewhat confused and so was unable to give specifics on how many times she vomited how much diarrhea she had.  Says that she has minimal abdominal swelling at this time and some mild discomfort.  No chest pain.  No headache currently.  Stating that she was at home with her husband and another family member who are both disabled.  Says that she does not want [place] to call them for additional history but is okay with [place] calling her son.       Home Medications Prior to Admission medications   Medication Sig Start Date End Date Taking? Authorizing Provider  albuterol (VENTOLIN HFA) 108 (90 Base) MCG/ACT inhaler Inhale 2 puffs into the lungs every 4 (four) hours as needed for wheezing or shortness of breath. 04/05/21   Burchette, Elberta Fortis, MD  amLODipine (NORVASC) 5 MG tablet TAKE ONE TABLET DAILY 10/10/22   Burchette, Elberta Fortis, MD  aspirin EC 81 MG tablet Take 81 mg by mouth daily.    [provider]  Cholecalciferol (VITAMIN D3) 5000 UNITS CAPS Take 1 capsule by mouth daily.    [provider]  Clobetasol Propionate Emulsion (OLUX-E) 0.05 % topical foam Apply 1 Application topically 2 (two) times daily. 08/28/21   Burchette, Elberta Fortis, MD  denosumab (PROLIA) 60 MG/ML SOSY injection Inject 60 mg into the skin every 6 (six) months.     [provider]  diclofenac Sodium (VOLTAREN) 1 % GEL Apply 4 g topically 4 (four) times daily. To affected joint. 01/08/19   Rodolph Bong, MD  ketoconazole (NIZORAL) 2 % cream Apply 1 Application topically daily. 09/17/22   Burchette, Elberta Fortis, MD  losartan-hydrochlorothiazide [facility]) 100-12.5 MG tablet TAKE 1 TABLET DAILY 08/27/22   Burchette, Elberta Fortis, MD  metoprolol succinate (TOPROL-XL) 25 MG 24 hr tablet Take 1 tablet (25 mg total) by mouth daily. 08/17/22   Burchette, Elberta Fortis, MD  mometasone (ELOCON) 0.1 % lotion APPLY TOPICALLY ONCE A DAY AS DIRECTED 11/25/19   Burchette, Elberta Fortis, MD  Multiple Vitamins-Minerals (PRESERVISION AREDS 2 PO) Take by mouth daily.    [provider]  pravastatin (PRAVACHOL) 20 MG tablet TAKE 1 TABLET DAILY 05/24/22   Burchette, Elberta Fortis, MD  traZODone (DESYREL) 50 MG tablet TAKE TWO TABLETS BY MOUTH AT BEDTIME 02/23/22   Burchette, Elberta Fortis, MD      Allergies    Follow-up [calcilo xd] and Simvastatin    Review of Systems   Review of Systems  Physical Exam Updated Vital Signs BP (!) 140/67   Pulse 73   Temp 98 F (36.7 C)   Resp 15   SpO2 97%  Physical Exam Vitals and nursing note reviewed.  Constitutional:      General: She is not in acute distress.    Appearance: She is well-developed.  Comments: Covered in dried emesis  HENT:     Head: Normocephalic and atraumatic.     Right Ear: External ear normal.     Left Ear: External ear normal.     Nose: Nose normal.  Eyes:     Extraocular Movements: Extraocular movements intact.     Conjunctiva/sclera: Conjunctivae normal.     Pupils: Pupils are equal, round, and reactive to light.  Cardiovascular:     Rate and Rhythm: Normal rate and regular rhythm.     Heart sounds: No murmur heard. Pulmonary:     Effort: Pulmonary effort is normal. No respiratory distress.     Breath sounds: Normal breath sounds.  Abdominal:     General: There is distension.     Palpations: There is no mass.      Tenderness: There is abdominal tenderness (Minimal diffuse). There is no guarding.  Musculoskeletal:     Cervical back: Normal range of motion and neck supple.     Right lower leg: No edema.     Left lower leg: No edema.  Skin:    General: Skin is warm and dry.  Neurological:     Mental Status: She is alert. Mental status is at baseline.     Cranial Nerves: No cranial nerve deficit.     Sensory: No sensory deficit.     Motor: No weakness.     Comments: Alert and oriented to self and place.  Was unable to give the year.  Did know it was August.  Psychiatric:        Mood and Affect: Mood normal.     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  Procedures Procedures  {Document cardiac monitor, telemetry assessment procedure when appropriate:1}  Medications Ordered in ED Medications - No data to display  ED Course/ Medical Decision Making/ A&P Clinical Course as of 10/23/22 1256  Tue Oct 23, 2022  1145 Attempted to contact son regarding patient's symptoms without success. [RP]    Clinical Course User Index [RP] Rondel Baton, MD   {   Click here for ABCD2, HEART and other calculatorsREFRESH Note before signing :1}                              Medical Decision Making Amount and/or Complexity of Data Reviewed Labs: ordered. Radiology: ordered.  Risk Prescription drug management. Decision regarding hospitalization.   ***  {Document critical care time when  appropriate:1} {Document review of labs and clinical decision tools ie heart score, Chads2Vasc2 etc:1}  {Document your independent review of radiology images, and any outside records:1} {Document your discussion with family members, caretakers, and with consultants:1} {Document social determinants of health affecting pt's care:1} {Document your decision making why or why not admission, treatments were needed:1} Final Clinical Impression(s) / ED Diagnoses Final diagnoses:  None    Rx / DC Orders ED Discharge Orders     None

## 2022-10-23 NOTE — Progress Notes (Signed)
Progress Note  RN called due to patient's sodium level increasing from 116-126 within a 5-hour range, patient was on hypertonic 3% saline.  This was stopped.  IV D5 half NS was started with plan to reevaluate in few hours and transition to NS.  Patient was not in any acute distress, she complained of headache, Tylenol was given.  BP 130/75   Pulse 76   Temp 98.1 F (36.7 C) (Oral)   Resp 13   SpO2 98%

## 2022-10-23 NOTE — Progress Notes (Signed)
   Sodium went from 126 to 128 in an hour and 20 min.  No intervention occurred.  Hypertonic saline has ben stopped.  Plan: Will give some 1/2NS    Author: Buena Irish, MD 10/23/2022 10:05 PM  For on call review [email or web address].

## 2022-10-23 NOTE — Progress Notes (Signed)
  Progress Note  Patient's sodium jumped 116-126 in 4 hours.  Plan: Hypertonic saline was discontinued.  I spoke with Dr. Juel Burrow of nephrology.  Will continue to monitor per his recommendations.  D5W or half-normal saline should be started if the sodium continues to rise.   Author: Buena Irish, MD 10/23/2022 9:10 PM  For on call review [email or web address].

## 2022-10-23 NOTE — ED Notes (Signed)
PT GONE TO CT

## 2022-10-24 ENCOUNTER — Telehealth: Payer: Self-pay

## 2022-10-24 DIAGNOSIS — E871 Hypo-osmolality and hyponatremia: Secondary | ICD-10-CM | POA: Diagnosis not present

## 2022-10-24 LAB — COMPREHENSIVE METABOLIC PANEL
ALT: 47 U/L — ABNORMAL HIGH (ref 0–44)
AST: 35 U/L (ref 15–41)
Albumin: 3.9 g/dL (ref 3.5–5.0)
Alkaline Phosphatase: 73 U/L (ref 38–126)
Anion gap: 10 (ref 5–15)
BUN: 10 mg/dL (ref 8–23)
CO2: 23 mmol/L (ref 22–32)
Calcium: 8.3 mg/dL — ABNORMAL LOW (ref 8.9–10.3)
Chloride: 81 mmol/L — ABNORMAL LOW (ref 98–111)
Creatinine, Ser: 0.52 mg/dL (ref 0.44–1.00)
GFR, Estimated: >60 mL/min (ref >60)
Glucose, Bld: 153 mg/dL — ABNORMAL HIGH (ref 70–99)
Potassium: 2.6 mmol/L — CL (ref 3.5–5.1)
Sodium: 114 mmol/L — CL (ref 135–145)
Total Bilirubin: 1.1 mg/dL (ref 0.3–1.2)
Total Protein: 6.5 g/dL (ref 6.5–8.1)

## 2022-10-24 LAB — CBC
HCT: 34 % — ABNORMAL LOW (ref 36.0–46.0)
Hemoglobin: 11.7 g/dL — ABNORMAL LOW (ref 12.0–15.0)
MCH: 31.1 pg (ref 26.0–34.0)
MCHC: 34.4 g/dL (ref 30.0–36.0)
MCV: 90.4 fL (ref 80.0–100.0)
Platelets: 153 10*3/uL (ref 150–400)
RBC: 3.76 MIL/uL — ABNORMAL LOW (ref 3.87–5.11)
RDW: 12.2 % (ref 11.5–15.5)
WBC: 10.4 10*3/uL (ref 4.0–10.5)
nRBC: 0 % (ref 0.0–0.2)

## 2022-10-24 LAB — SODIUM
Sodium: 127 mmol/L — ABNORMAL LOW (ref 135–145)
Sodium: 129 mmol/L — ABNORMAL LOW (ref 135–145)
Sodium: 130 mmol/L — ABNORMAL LOW (ref 135–145)
Sodium: 131 mmol/L — ABNORMAL LOW (ref 135–145)
Sodium: 130 mmol/L — ABNORMAL LOW (ref 135–145)
Sodium: 130 mmol/L — ABNORMAL LOW (ref 135–145)

## 2022-10-24 LAB — UREA NITROGEN, URINE: Urea Nitrogen, Ur: 351 mg/dL

## 2022-10-24 LAB — MAGNESIUM: Magnesium: 2.3 mg/dL (ref 1.7–2.4)

## 2022-10-24 NOTE — Telephone Encounter (Signed)
Patient unable to complete AWV. currently in Hospital.

## 2022-10-24 NOTE — Progress Notes (Addendum)
PROGRESS NOTE  Cassandra Foster MVH:[id number] DOB: 1940-03-16 DOA: 10/23/2022 PCP: Kristian Covey, MD  HPI/Recap of past 24 hours: Cassandra Foster is a 82 y.o. female with medical history significant for hypertension, hyperlipidemia and osteoporosis presents because of nausea, vomiting, and diarrhea.  The patient provides the history and she is having difficulty with some confusion but she reports that the nausea, vomiting, and diarrhea started today.  Workup revealed severe hyponatremia with serum sodium of 114.  She received hypertonic saline in the ED and [facility] was asked to admit.  Hypertonic saline was DC'd overnight.  10/24/2022: The patient was seen and examined at bedside.  She denies having any headache or dizziness or nausea at this time.  Serum sodium peaked at 131.    Assessment/Plan: Principal Problem:   Hyponatremia Symptomatic severe hyponatremia, suspect multifactorial, likely contributed by home HCTZ.   Hyponatremia is resolving-received hypertonic saline in the ED which was DC'd overnight.  Serum sodium 131.  Admission serum sodium 114.  Hypertension Continue to hold off on hydrochlorothiazide Resume home amlodipine and Toprol-XL. Continue to closely monitor vital signs.  Resolved hypokalemia Post repletion, serum potassium 3.7.  4.   Resolved hypotension MAP in the 50s initially on presentation. Hypotension has resolved Continue to closely monitor vital signs  Time: 50 minutes.  Code Status: Full code  Family Communication: None at bedside  Disposition Plan: Admitted to telemetry unit   Consultants: None.  Procedures: None.  Antimicrobials: None.  DVT prophylaxis: SQ heparin 3 times daily  Status is: Inpatient The patient requires at least 2 midnights for further evaluation and treatment of present condition.    Objective: Vitals:   10/24/22 0700 10/24/22 0716 10/24/22 0800 10/24/22 1001  BP: (!) 149/48  (!) 144/53 (!) 144/60  Pulse:  78  90 89  Resp: 16  18 18   Temp:  98 F (36.7 C)    TempSrc:  Oral    SpO2: 95%  96% 99%  Weight:    70.9 kg  Height:    5\' 3"  (1.6 m)    Intake/Output Summary (Last 24 hours) at 10/24/2022 1511 Last data filed at 10/24/2022 1300 Gross per 24 hour  Intake 1506.35 ml  Output --  Net 1506.35 ml   Filed Weights   10/24/22 0200 10/24/22 1001  Weight: 58 kg 70.9 kg    Exam:  General: 82 y.o. year-old female well developed well nourished in no acute distress.  Alert and oriented x3. Cardiovascular: Regular rate and rhythm with no rubs or gallops.  No thyromegaly or JVD noted.   Respiratory: Clear to auscultation with no wheezes or rales. Good inspiratory effort. Abdomen: Soft nontender nondistended with normal bowel sounds x4 quadrants. Musculoskeletal: No lower extremity edema. 2/4 pulses in all 4 extremities. Skin: No ulcerative lesions noted or rashes, Psychiatry: Mood is appropriate for condition and setting   Data Reviewed: CBC: Recent Labs  Foster 10/23/22 1126 10/24/22 0535  WBC 8.3 10.4  NEUTROABS 6.1  --   HGB 12.2 11.7*  HCT 33.7* 34.0*  MCV 87.5 90.4  PLT 131* 153   Basic Metabolic Panel: Recent Labs  Foster 10/23/22 1126 10/23/22 1342 10/23/22 1540 10/23/22 2107 10/24/22 0024 10/24/22 0535 10/24/22 0833 10/24/22 1347  NA 114* 113*   < > 128* 127* 129* 130* 131*  K 2.6*  --   --  3.7  --   --   --   --   CL 81*  --   --  98  --   --   --   --   CO2 23  --   --  20*  --   --   --   --   GLUCOSE 153*  --   --  120*  --   --   --   --   BUN 10  --   --  14  --   --   --   --   CREATININE 0.52  --   --  0.51  --   --   --   --   CALCIUM 8.3*  --   --  8.6*  --   --   --   --   MG  --  1.5*  --   --   --   --   --   --    < > = values in this interval not displayed.   GFR: Estimated Creatinine Clearance: 51.2 mL/min (by C-G formula based on SCr of 0.51 mg/dL). Liver Function Tests: Recent Labs  Foster 10/23/22 1126  AST 35  ALT 47*  ALKPHOS 73   BILITOT 1.1  PROT 6.5  ALBUMIN 3.9   Recent Labs  Foster 10/23/22 1126  LIPASE 48   Recent Labs  Foster 10/23/22 1126  AMMONIA 21   Coagulation Profile: No results for input(s): "INR", "PROTIME" in the last 168 hours. Cardiac Enzymes: No results for input(s): "CKTOTAL", "CKMB", "CKMBINDEX", "TROPONINI" in the last 168 hours. BNP (last 3 results) No results for input(s): "PROBNP" in the last 8760 hours. HbA1C: No results for input(s): "HGBA1C" in the last 72 hours. CBG: No results for input(s): "GLUCAP" in the last 168 hours. Lipid Profile: No results for input(s): "CHOL", "HDL", "LDLCALC", "TRIG", "CHOLHDL", "LDLDIRECT" in the last 72 hours. Thyroid Function Tests: No results for input(s): "TSH", "T4TOTAL", "FREET4", "T3FREE", "THYROIDAB" in the last 72 hours. Anemia Panel: No results for input(s): "VITAMINB12", "FOLATE", "FERRITIN", "TIBC", "IRON", "RETICCTPCT" in the last 72 hours. Urine analysis:    Component Value Date/Time   COLORURINE YELLOW 10/23/2022 1307   APPEARANCEUR CLEAR 10/23/2022 1307   LABSPEC 1.012 10/23/2022 1307   PHURINE 7.0 10/23/2022 1307   GLUCOSEU NEGATIVE 10/23/2022 1307   HGBUR NEGATIVE 10/23/2022 1307   BILIRUBINUR NEGATIVE 10/23/2022 1307   BILIRUBINUR n 07/23/2013 1056   KETONESUR NEGATIVE 10/23/2022 1307   PROTEINUR NEGATIVE 10/23/2022 1307   UROBILINOGEN 0.2 07/23/2013 1056   UROBILINOGEN 0.2 03/18/2007 1411   NITRITE NEGATIVE 10/23/2022 1307   LEUKOCYTESUR NEGATIVE 10/23/2022 1307   Sepsis Labs: @LABRCNTIP (procalcitonin:4,lacticidven:4)  ) Recent Results (from the past 240 hour(s))  MRSA Next Gen by PCR, Nasal     Status: None   Collection Time: 10/23/22  6:30 PM   Specimen: Nasal Mucosa; Nasal Swab  Result Value Ref Range Status   MRSA by PCR Next Gen NOT DETECTED NOT DETECTED Final    Comment: (NOTE) The GeneXpert MRSA Assay (FDA approved for NASAL specimens only), is one component of a comprehensive MRSA colonization  surveillance program. It is not intended to diagnose MRSA infection nor to guide or monitor treatment for MRSA infections. Test performance is not FDA approved in patients less than 55 years old. Performed at [facility], [address], [place], [place] [address]       Studies: No results found.  Scheduled Meds:  amLODipine  5 mg Oral Daily   aspirin EC  81 mg Oral Daily   Chlorhexidine Gluconate Cloth  6 each Topical Daily  heparin  5,000 Units Subcutaneous Q8H   metoprolol succinate  25 mg Oral Daily    Continuous Infusions:   LOS: 1 day     Darlin Drop, MD [facility] Pager [phone]  If 7PM-7AM, please contact night-coverage [email or web address] Password [facility] 10/24/2022, 3:11 PM

## 2022-10-24 NOTE — Progress Notes (Signed)
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 No home needs reported at this time. TOC will follow.    10/24/22 0756  TOC Brief Assessment  Insurance and Status Reviewed  Patient has primary care physician Yes  Home environment has been reviewed Lives with husband and sister.  Prior level of function: Independent.  Prior/Current Home Services No current home services  Social Determinants of Health Reivew SDOH reviewed no interventions necessary  Readmission risk has been reviewed Yes  Transition of care needs no transition of care needs at this time

## 2022-10-24 NOTE — Plan of Care (Signed)
  Problem: Education: Goal: Knowledge of General Education information will improve Description: Including pain rating scale, medication(s)/side effects and non-pharmacologic comfort measures Outcome: Progressing   Problem: Nutrition: Goal: Adequate nutrition will be maintained Outcome: Not Progressing   Problem: Pain Managment: Goal: General experience of comfort will improve Outcome: Progressing   Problem: Safety: Goal: Ability to remain free from injury will improve Outcome: Progressing

## 2022-10-24 NOTE — Plan of Care (Signed)

## 2022-10-25 DIAGNOSIS — E871 Hypo-osmolality and hyponatremia: Secondary | ICD-10-CM | POA: Diagnosis not present

## 2022-10-25 LAB — SODIUM
Sodium: 129 mmol/L — ABNORMAL LOW (ref 135–145)
Sodium: 126 mmol/L — ABNORMAL LOW (ref 135–145)

## 2022-10-25 LAB — BASIC METABOLIC PANEL
Anion gap: 6 (ref 5–15)
BUN: 11 mg/dL (ref 8–23)
CO2: 20 mmol/L — ABNORMAL LOW (ref 22–32)
Calcium: 8 mg/dL — ABNORMAL LOW (ref 8.9–10.3)
Chloride: 102 mmol/L (ref 98–111)
Creatinine, Ser: 0.55 mg/dL (ref 0.44–1.00)
GFR, Estimated: >60 mL/min (ref >60)
Glucose, Bld: 103 mg/dL — ABNORMAL HIGH (ref 70–99)
Potassium: 4 mmol/L (ref 3.5–5.1)
Sodium: 128 mmol/L — ABNORMAL LOW (ref 135–145)

## 2022-10-25 NOTE — Discharge Summary (Addendum)
Discharge Summary  Cassandra Foster ZOX:[id number] DOB: [birth year]  PCP: Kristian Covey, MD  Admit date: 10/23/2022 Discharge date: 10/25/2022  Time spent: 35 minutes.  Recommendations for Outpatient Follow-up:  Follow-up with your primary care provider within a week. Obtain BMP on 10/29/2022. Take your medications as prescribed. Home hydrochlorothiazide DC'd.  Discharge Diagnoses:  Active Hospital Problems   Diagnosis Date Noted   Hyponatremia 07/30/2013    Resolved Hospital Problems  No resolved problems to display.    Discharge Condition: Stable  Diet recommendation: Resume previous diet.  Vitals:   10/25/22 0424 10/25/22 1227  BP: (!) 148/65 (!) 136/53  Pulse: 78 79  Resp: 17 20  Temp: 98.1 F (36.7 C) 98.2 F (36.8 C)  SpO2: 100% 99%    History of present illness:  Cassandra Foster is a 82 y.o. female with medical history significant for hypertension on hydrochlorothiazide, hyperlipidemia and osteoporosis [facility] to [facility] department due to nausea, vomiting, and diarrhea that started on the day of presentation.  Workup revealed severe hyponatremia with serum sodium of 114.  The patient received hypertonic saline drip in the ED and [facility], hospitalist service, was asked to admit.  Hypertonic saline drip was DC'd overnight on 10/23/2022.  Home HCTZ was held.  10/25/2022: The patient was seen and examined at bedside.  There were no acute events overnight.  She denies any headache or nausea.  No reported dizziness.  She has no new complaints.   Hospital Course:  Principal Problem:   Hyponatremia  Symptomatic hyponatremia, suspect multifactorial, likely contributed by home HCTZ.   Admission serum sodium 114, improved to 128.   Symptoms have resolved. Repeat BMP on 10-29-22.  Resolved acute metabolic encephalopathy secondary to hyponatremia Back to her baseline mentation.   Hypertension Home HCTZ DC'd. Continue home amlodipine and  Toprol-XL. Follow-up with your primary care provider within a week.   Resolved hypokalemia   Resolved hypotension  Resolved hypomagnesemia      Consultants: None.   Procedures: None.   Antimicrobials: None.  Discharge Exam: BP (!) 136/53   Pulse 79   Temp 98.2 F (36.8 C) (Oral)   Resp 20   Ht 5\' 3"  (1.6 m)   Wt 70.9 kg   SpO2 99%   BMI 27.69 kg/m  General: 82 y.o. year-old female well developed well nourished in no acute distress.  Alert and oriented x3. Cardiovascular: Regular rate and rhythm with no rubs or gallops.  No thyromegaly or JVD noted.   Respiratory: Clear to auscultation with no wheezes or rales. Good inspiratory effort. Abdomen: Soft nontender nondistended with normal bowel sounds x4 quadrants. Musculoskeletal: No lower extremity edema. 2/4 pulses in all 4 extremities. Skin: No ulcerative lesions noted or rashes, Psychiatry: Mood is appropriate for condition and setting  Discharge Instructions You were cared for by a hospitalist during your hospital stay. If you have any questions about your discharge medications or the care you received while you were in the hospital after you are discharged, you can call the unit and asked to speak with the hospitalist on call if the hospitalist that took care of you is not available. Once you are discharged, your primary care physician will handle any further medical issues. Please note that NO REFILLS for any discharge medications will be authorized once you are discharged, as it is imperative that you return to your primary care physician (or establish a relationship with a primary care physician if you do not have one) for your  aftercare needs so that they can reassess your need for medications and monitor your lab values.   Allergies as of 10/25/2022       Reactions   Follow-up [calcilo Xd]    Simvastatin Other (See Comments)   Body aches        Medication List     STOP taking these medications     losartan-hydrochlorothiazide 100-12.5 MG tablet Commonly known as: HYZAAR       TAKE these medications    albuterol 108 (90 Base) MCG/ACT inhaler Commonly known as: VENTOLIN HFA Inhale 2 puffs into the lungs every 4 (four) hours as needed for wheezing or shortness of breath.   amLODipine 5 MG tablet Commonly known as: NORVASC TAKE ONE TABLET DAILY   aspirin EC 81 MG tablet Take 81 mg by mouth daily.   Clobetasol Propionate Emulsion 0.05 % topical foam Commonly known as: Olux-E Apply 1 Application topically 2 (two) times daily.   denosumab 60 MG/ML Sosy injection Commonly known as: PROLIA Inject 60 mg into the skin every 6 (six) months.   diclofenac Sodium 1 % Gel Commonly known as: VOLTAREN Apply 4 g topically 4 (four) times daily. To affected joint.   ketoconazole 2 % cream Commonly known as: NIZORAL Apply 1 Application topically daily.   metoprolol succinate 25 MG 24 hr tablet Commonly known as: TOPROL-XL Take 1 tablet (25 mg total) by mouth daily.   mometasone 0.1 % lotion Commonly known as: ELOCON APPLY TOPICALLY ONCE A DAY AS DIRECTED   pravastatin 20 MG tablet Commonly known as: PRAVACHOL TAKE 1 TABLET DAILY   PRESERVISION AREDS 2 PO Take by mouth daily.   traZODone 50 MG tablet Commonly known as: DESYREL TAKE TWO TABLETS BY MOUTH AT BEDTIME   Vitamin D3 125 MCG (5000 UT) Caps Take 1 capsule by mouth daily.       Allergies  Allergen Reactions   Follow-Up [Calcilo Xd]    Simvastatin Other (See Comments)    Body aches    Follow-up Information     Kristian Covey, MD. Call today.   Specialty: Family Medicine Why: Please call for a post hospital follow up appointment Contact information: [address] Christena Flake [facility] [place] [address] [phone]                  The results of significant diagnostics from this hospitalization (including imaging, microbiology, ancillary and laboratory) are listed below for reference.     Significant Diagnostic Studies: CT Head Wo Contrast  Result Date: 10/23/2022 CLINICAL DATA:  Altered mental status.  Vomiting. EXAM: CT HEAD WITHOUT CONTRAST TECHNIQUE: Contiguous axial images were obtained from the base of the skull through the vertex without intravenous contrast. RADIATION DOSE REDUCTION: This exam was performed according to the departmental dose-optimization program which includes automated exposure control, adjustment of the mA and/or kV according to patient size and/or use of iterative reconstruction technique. COMPARISON:  None Available. FINDINGS: Brain: No evidence of acute infarction, hemorrhage, hydrocephalus, extra-axial collection or mass lesion/mass effect. There is bilateral periventricular hypodensity, which is non-specific but most likely seen in the settings of microvascular ischemic changes. Mild in extent. Vascular: No hyperdense vessel or unexpected calcification. Skull: Normal. Negative for fracture or focal lesion. Sinuses/Orbits: No acute finding. Other: None. IMPRESSION: *No acute intracranial abnormality. Electronically Signed   By: Jules Schick M.D.   On: 10/23/2022 15:08   CT ABDOMEN PELVIS W CONTRAST  Result Date: 10/23/2022 CLINICAL DATA:  Diarrhea.  Vomiting.  Bowel obstruction  suspected. EXAM: CT ABDOMEN AND PELVIS WITH CONTRAST TECHNIQUE: Multidetector CT imaging of the abdomen and pelvis was performed using the standard protocol following bolus administration of intravenous contrast. RADIATION DOSE REDUCTION: This exam was performed according to the departmental dose-optimization program which includes automated exposure control, adjustment of the mA and/or kV according to patient size and/or use of iterative reconstruction technique. CONTRAST:  OMNIPAQUE IOHEXOL 300 MG/ML  SOLN COMPARISON:  None Available. FINDINGS: Lower chest: There are patchy atelectatic changes in the visualized lung bases. No overt consolidation. No pleural effusion. The  heart is normal in size. No pericardial effusion. Hepatobiliary: The liver is normal in size. Non-cirrhotic configuration. No suspicious mass. No intrahepatic or extrahepatic bile duct dilation. No calcified gallstones. Normal gallbladder wall thickness. No pericholecystic inflammatory changes. Pancreas: Unremarkable. No pancreatic ductal dilatation or surrounding inflammatory changes. Spleen: Within normal limits. No focal lesion. Adrenals/Urinary Tract: Adrenal glands are unremarkable. No suspicious renal mass. No hydronephrosis. No renal or ureteric calculi. Unremarkable urinary bladder. Stomach/Bowel: Stomach is distended/fluid-filled. No disproportionate dilation of the small or large bowel loops. No evidence of abnormal bowel wall thickening or inflammatory changes. The appendix is unremarkable. Vascular/Lymphatic: No ascites or pneumoperitoneum. No abdominal or pelvic lymphadenopathy, by size criteria. No aneurysmal dilation of the major abdominal arteries. There are mild peripheral atherosclerotic vascular calcifications of the aorta and its major branches. Reproductive: The uterus is unremarkable. The ovaries are unremarkable. Other: There is a 2.0 x 2.3 cm soft tissue attenuation structure centered in the subcutaneous fat/skin in the right paramedian epigastric region, incompletely characterized on the current examination but favored to represent an epidermal inclusion cyst. There is a tiny fat containing umbilical hernia. The soft tissues and abdominal wall are otherwise unremarkable. Musculoskeletal: No suspicious osseous lesions. There are mild multilevel degenerative changes in the visualized spine. IMPRESSION: 1. No bowel obstruction. No acute inflammatory process identified within the abdomen or pelvis. 2. Multiple other nonacute observations, as described above. Electronically Signed   By: Jules Schick M.D.   On: 10/23/2022 15:06    Microbiology: Recent Results (from the past 240 hour(s))   MRSA Next Gen by PCR, Nasal     Status: None   Collection Time: 10/23/22  6:30 PM   Specimen: Nasal Mucosa; Nasal Swab  Result Value Ref Range Status   MRSA by PCR Next Gen NOT DETECTED NOT DETECTED Final    Comment: (NOTE) The GeneXpert MRSA Assay (FDA approved for NASAL specimens only), is one component of a comprehensive MRSA colonization surveillance program. It is not intended to diagnose MRSA infection nor to guide or monitor treatment for MRSA infections. Test performance is not FDA approved in patients less than 61 years old. Performed at [facility], [address] [place], [place] [address]      Labs: Basic Metabolic Panel: Recent Labs  Lab 10/23/22 1126 10/23/22 1342 10/23/22 1540 10/23/22 2107 10/24/22 0024 10/24/22 1347 10/24/22 1819 10/24/22 2152 10/25/22 0204 10/25/22 0525 10/25/22 0954  NA 114* 113*   < > 128*   < > 131* 130* 130* 129* 128* 126*  K 2.6*  --   --  3.7  --   --   --   --   --  4.0  --   CL 81*  --   --  98  --   --   --   --   --  102  --   CO2 23  --   --  20*  --   --   --   --   --  20*  --   GLUCOSE 153*  --   --  120*  --   --   --   --   --  103*  --   BUN 10  --   --  14  --   --   --   --   --  11  --   CREATININE 0.52  --   --  0.51  --   --   --   --   --  0.55  --   CALCIUM 8.3*  --   --  8.6*  --   --   --   --   --  8.0*  --   MG  --  1.5*  --   --   --  2.3  --   --   --   --   --    < > = values in this interval not displayed.   Liver Function Tests: Recent Labs  Lab 10/23/22 1126  AST 35  ALT 47*  ALKPHOS 73  BILITOT 1.1  PROT 6.5  ALBUMIN 3.9   Recent Labs  Lab 10/23/22 1126  LIPASE 48   Recent Labs  Lab 10/23/22 1126  AMMONIA 21   CBC: Recent Labs  Lab 10/23/22 1126 10/24/22 0535  WBC 8.3 10.4  NEUTROABS 6.1  --   HGB 12.2 11.7*  HCT 33.7* 34.0*  MCV 87.5 90.4  PLT 131* 153   Cardiac Enzymes: No results for input(s): "CKTOTAL", "CKMB", "CKMBINDEX", "TROPONINI" in the last 168  hours. BNP: BNP (last 3 results) No results for input(s): "BNP" in the last 8760 hours.  ProBNP (last 3 results) No results for input(s): "PROBNP" in the last 8760 hours.  CBG: No results for input(s): "GLUCAP" in the last 168 hours.     Signed:  Darlin Drop, MD [facility] 10/25/2022, 6:12 PM

## 2022-10-25 NOTE — Care Management Important Message (Signed)
Important Message  Patient Details  Name: Cassandra Foster MRN: [id number] Date of Birth: 08/27/1940   Medicare Important Message Given:  N/A - LOS <3 / Initial given by admissions     Corey Harold 10/25/2022, 10:02 AM

## 2022-10-25 NOTE — Progress Notes (Signed)
PT Cancellation Note  Patient Details Name: Cassandra Foster MRN: [id number] DOB: 1940-08-06   Cancelled Treatment:    Reason Eval/Treat Not Completed: PT screened, no needs identified, will sign off. Patient walking in room independently.  10:24 AM, 10/25/22 Ocie Bob, MPT Physical Therapist with [facility] 336 [phone] office [phone] mobile phone

## 2022-10-25 NOTE — Progress Notes (Signed)
Ng Discharge Note  Admit Date:  10/23/2022 Discharge date: 10/25/2022   Cassandra Foster to be D/C'd Home per MD order.  AVS completed. Patient/caregiver able to verbalize understanding.  Discharge Medication: Allergies as of 10/25/2022       Reactions   Follow-up [calcilo Xd]    Simvastatin Other (See Comments)   Body aches        Medication List     STOP taking these medications    losartan-hydrochlorothiazide 100-12.5 MG tablet Commonly known as: HYZAAR       TAKE these medications    albuterol 108 (90 Base) MCG/ACT inhaler Commonly known as: VENTOLIN HFA Inhale 2 puffs into the lungs every 4 (four) hours as needed for wheezing or shortness of breath.   amLODipine 5 MG tablet Commonly known as: NORVASC TAKE ONE TABLET DAILY   aspirin EC 81 MG tablet Take 81 mg by mouth daily.   Clobetasol Propionate Emulsion 0.05 % topical foam Commonly known as: Olux-E Apply 1 Application topically 2 (two) times daily.   denosumab 60 MG/ML Sosy injection Commonly known as: PROLIA Inject 60 mg into the skin every 6 (six) months.   diclofenac Sodium 1 % Gel Commonly known as: VOLTAREN Apply 4 g topically 4 (four) times daily. To affected joint.   ketoconazole 2 % cream Commonly known as: NIZORAL Apply 1 Application topically daily.   metoprolol succinate 25 MG 24 hr tablet Commonly known as: TOPROL-XL Take 1 tablet (25 mg total) by mouth daily.   mometasone 0.1 % lotion Commonly known as: ELOCON APPLY TOPICALLY ONCE A DAY AS DIRECTED   pravastatin 20 MG tablet Commonly known as: PRAVACHOL TAKE 1 TABLET DAILY   PRESERVISION AREDS 2 PO Take by mouth daily.   traZODone 50 MG tablet Commonly known as: DESYREL TAKE TWO TABLETS BY MOUTH AT BEDTIME   Vitamin D3 125 MCG (5000 UT) Caps Take 1 capsule by mouth daily.        Discharge Assessment: Vitals:   10/25/22 0424 10/25/22 1227  BP: (!) 148/65 (!) 136/53  Pulse: 78 79  Resp: 17 20  Temp: 98.1 F  (36.7 C) 98.2 F (36.8 C)  SpO2: 100% 99%   Skin clean, dry and intact without evidence of skin break down, no evidence of skin tears noted. IV catheter discontinued intact. Site without signs and symptoms of complications - no redness or edema noted at insertion site, patient denies c/o pain - only slight tenderness at site.  Dressing with slight pressure applied.  D/c Instructions-Education: Discharge instructions given to patient/family with verbalized understanding. D/c education completed with patient/family including follow up instructions, medication list, d/c activities limitations if indicated, with other d/c instructions as indicated by MD - patient able to verbalize understanding, all questions fully answered. Patient instructed to return to ED, call 911, or call MD for any changes in condition.  Patient escorted via WC, and D/C home via private auto.  Cristal Ford, LPN 9/52/8413 2:44 PM

## 2022-10-25 NOTE — Evaluation (Signed)
Occupational Therapy Evaluation Patient Details Name: Cassandra Foster MRN: [id number] DOB: [birth year] Today's Date: 10/25/2022   History of Present Illness Cassandra Foster is a 82 y.o. female with medical history significant for hypertension, hyperlipidemia and osteoporosis presents because of nausea, vomiting, and diarrhea.  The patient provides the history and she is having difficulty with some confusion but she reports that the nausea, vomiting, and diarrhea started today.  Workup revealed severe hyponatremia with serum sodium of 114.  She received hypertonic saline in the ED and TRH was asked to admit.  Hypertonic saline was DC'd overnight   Clinical Impression   Pt up performing hygiene tasks on OT arrival. Independent in functional mobility, toileting, hygiene. Pt reports no dizziness, feels much better and close to baseline with exception of mild fatigue. Pt demonstrates BUE strength WNL, no difficulty with ADLs. No further OT services required at this time.           Functional Status Assessment  Patient has had a recent decline in their functional status and demonstrates the ability to make significant improvements in function in a reasonable and predictable amount of time.  Equipment Recommendations  None recommended by OT       Precautions / Restrictions Precautions Precautions: None Restrictions Weight Bearing Restrictions: No      Mobility Bed Mobility Overal bed mobility: Independent                  Transfers Overall transfer level: Independent Equipment used: None                          ADL either performed or assessed with clinical judgement   ADL Overall ADL's : Independent;At baseline                                             Vision Baseline Vision/History: 0 No visual deficits Ability to See in Adequate Light: 0 Adequate Patient Visual Report: No change from baseline Vision Assessment?: No apparent visual  deficits            Pertinent Vitals/Pain Pain Assessment Pain Assessment: No/denies pain     Extremity/Trunk Assessment Upper Extremity Assessment Upper Extremity Assessment: Overall WFL for tasks assessed   Lower Extremity Assessment Lower Extremity Assessment: Overall WFL for tasks assessed   Cervical / Trunk Assessment Cervical / Trunk Assessment: Normal   Communication Communication Communication: No apparent difficulties   Cognition Arousal: Alert Behavior During Therapy: WFL for tasks assessed/performed Overall Cognitive Status: Within Functional Limits for tasks assessed                                                  Home Living Family/patient expects to be discharged to:: Private residence Living Arrangements: Spouse/significant other;Other (Comment) (sister) Available Help at Discharge: Other (Comment);Family;Available PRN/intermittently;Friend(s) (cleaner every other week) Type of Home: Mobile home Home Access: Stairs to enter Entrance Stairs-Number of Steps: 4-5 Entrance Stairs-Rails: Left (wall on the other side) Home Layout: One level     Bathroom Shower/Tub: Producer, television/film/video: Standard     Home Equipment: Agricultural consultant (2 wheels);Cane - single point;BSC/3in1;Shower seat  Prior Functioning/Environment Prior Level of Function : Independent/Modified Independent;Driving;Other (comment) (is caregiver for husband, sister, and son)             Mobility Comments: independent ADLs Comments: independent              AM-PAC OT "6 Clicks" Daily Activity     Outcome Measure Help from another person eating meals?: None Help from another person taking care of personal grooming?: None Help from another person toileting, which includes using toliet, bedpan, or urinal?: None Help from another person bathing (including washing, rinsing, drying)?: None Help from another person to put on and taking off  regular upper body clothing?: None Help from another person to put on and taking off regular lower body clothing?: None 6 Click Score: 24   End of Session    Activity Tolerance: Patient tolerated treatment well Patient left: in bed;with call bell/phone within reach;Other (comment) (imaging arrived)  OT Visit Diagnosis: Muscle weakness (generalized) (M62.81)                Time: 1914-7829 OT Time Calculation (min): 14 min Charges:  OT General Charges $OT Visit: 1 Visit OT Evaluation $OT Eval Low Complexity: 1 Low    Ezra Sites, OTR/L  [phone] 10/25/2022, 9:10 AM

## 2022-10-26 ENCOUNTER — Telehealth: Payer: Self-pay

## 2022-10-26 NOTE — Transitions of Care (Post Inpatient/ED Visit) (Signed)
10/26/2022  Name: Cassandra Foster MRN: [id number] DOB: Jan 30, 1941  Today's TOC FU Call Status: Today's TOC FU Call Status:: Successful TOC FU Call Completed TOC FU Call Complete Date: 10/26/22 Patient's Name and Date of Birth confirmed.  Transition Care Management Follow-up Telephone Call Date of Discharge: 10/25/22 [facility] Type of Discharge: Inpatient Admission Primary Inpatient Discharge Diagnosis:: "hyponatremia" How have you been since you were released from the hospital?: Better (Pt pleased to report that she is "geting stronger & better." Rested well last night. No pain. Appetite is "coming back." No BM in 2days-stares she has Miralax in the home and will take some today. She has been up walking & moving around.) Any questions or concerns?: No  Items Reviewed: Did you receive and understand the discharge instructions provided?: Yes Medications obtained,verified, and reconciled?: Yes (Medications Reviewed) Any new allergies since your discharge?: No Dietary orders reviewed?: Yes Type of Diet Ordered:: low salt/heart healthy Do you have support at home?: Yes People in Home: spouse  Medications Reviewed Today: Medications Reviewed Today     Reviewed by Charlyn Minerva, RN (Registered Nurse) on 10/26/22 at 1025  Med List Status: <None>   Medication Order Taking? Sig Documenting Provider Last Dose Status Informant  albuterol (VENTOLIN HFA) 108 (90 Base) MCG/ACT inhaler [id number] Yes Inhale 2 puffs into the lungs every 4 (four) hours as needed for wheezing or shortness of breath. Kristian Covey, MD Taking Active Self           Med Note Elesa Massed, Devona Konig Oct 23, 2022  6:22 PM) Pt still has this medication at home if she needs it.  amLODipine (NORVASC) 5 MG tablet [id number] Yes TAKE ONE TABLET DAILY Burchette, Elberta Fortis, MD Taking Active Self  aspirin EC 81 MG tablet [id number] Yes Take 81 mg by mouth daily. [provider]  Taking Active Self  Cholecalciferol (VITAMIN D3) 5000 UNITS CAPS [id number] Yes Take 1 capsule by mouth daily. [provider] Taking Active Self  Clobetasol Propionate Emulsion (OLUX-E) 0.05 % topical foam [id number] Yes Apply 1 Application topically 2 (two) times daily. Kristian Covey, MD Taking Active Self  denosumab (PROLIA) 60 MG/ML SOSY injection [id number] Yes Inject 60 mg into the skin every 6 (six) months. [provider] Taking Active Self  diclofenac Sodium (VOLTAREN) 1 % GEL [id number] Yes Apply 4 g topically 4 (four) times daily. To affected joint. Rodolph Bong, MD Taking Active Self  ketoconazole (NIZORAL) 2 % cream [id number] Yes Apply 1 Application topically daily. Kristian Covey, MD Taking Active Self  metoprolol succinate (TOPROL-XL) 25 MG 24 hr tablet [id number] Yes Take 1 tablet (25 mg total) by mouth daily. Kristian Covey, MD Taking Active Self  mometasone (ELOCON) 0.1 % lotion [id number] Yes APPLY TOPICALLY ONCE A DAY AS DIRECTED Burchette, Elberta Fortis, MD Taking Active Self  Multiple Vitamins-Minerals (PRESERVISION AREDS 2 PO) [id number] Yes Take by mouth daily. [provider] Taking Active Self  polyethylene glycol (MIRALAX / GLYCOLAX) 17 g packet [id number] Yes Take 17 g by mouth daily as needed for mild constipation. [provider] Taking Active Self  pravastatin (PRAVACHOL) 20 MG tablet [id number] Yes TAKE 1 TABLET DAILY Burchette, Elberta Fortis, MD Taking Active Self  traZODone (DESYREL) 50 MG tablet [id number] Yes TAKE TWO TABLETS BY MOUTH AT BEDTIME Kristian Covey, MD Taking Active Self            Home Care and Equipment/Supplies: Were Home  Health Services Ordered?: NA Any new equipment or medical supplies ordered?: NA  Functional Questionnaire: Do you need assistance with bathing/showering or dressing?: No Do you need assistance with meal preparation?: No Do you need assistance with eating?: No Do you have difficulty  maintaining continence: No Do you need assistance with getting out of bed/getting out of a chair/moving?: No Do you have difficulty managing or taking your medications?: No  Follow up appointments reviewed: PCP Follow-up appointment confirmed?: Yes Date of PCP follow-up appointment?: 10/31/22 Follow-up Provider: Dr. Caryl Never Specialist [facility] Follow-up appointment confirmed?: NA Do you need transportation to your follow-up appointment?: No (pt confirms family able to get her to appts) Do you understand care options if your condition(s) worsen?: Yes-patient verbalized understanding  SDOH Interventions Today    Flowsheet Row Most Recent Value  SDOH Interventions   Food Insecurity Interventions Intervention Not Indicated      TOC Interventions Today    Flowsheet Row Most Recent Value  TOC Interventions   TOC Interventions Discussed/Reviewed TOC Interventions Discussed, Arranged PCP follow up within 7 days/Care Guide scheduled      Interventions Today    Flowsheet Row Most Recent Value  Chronic Disease   Chronic disease during today's visit Hypertension (HTN)  General Interventions   General Interventions Discussed/Reviewed General Interventions Discussed, Doctor Visits, Durable Medical Equipment (DME)  Doctor Visits Discussed/Reviewed Doctor Visits Discussed, PCP  Durable Medical Equipment (DME) BP Cuff  [pt confirms she has BP monitor in the home and checks daily-has not had a chance to check BP yet this morning]  PCP/Specialist Visits Compliance with follow-up visit  Education Interventions   Education Provided Provided Education  Provided Verbal Education On Nutrition, Medication, When to see the doctor, Other  [bowel mgmt]  Nutrition Interventions   Nutrition Discussed/Reviewed Nutrition Discussed, Fluid intake, Portion sizes, Increasing proteins, Decreasing fats, Decreasing salt, Adding fruits and vegetables  Pharmacy Interventions   Pharmacy Dicussed/Reviewed  Pharmacy Topics Discussed, Medications and their functions  Safety Interventions   Safety Discussed/Reviewed Safety Discussed       Alessandra Grout [facility] Care Management Care Management Community Coordinator Direct Phone: [phone] Toll Free: [phone] Fax: [phone]

## 2022-10-31 ENCOUNTER — Encounter: Payer: Self-pay | Admitting: Family Medicine

## 2022-10-31 ENCOUNTER — Ambulatory Visit: Payer: Medicare PPO | Admitting: Family Medicine

## 2022-10-31 VITALS — BP 150/70 | HR 76 | Temp 97.9°F | Ht 63.0 in | Wt 159.8 lb

## 2022-10-31 DIAGNOSIS — I10 Essential (primary) hypertension: Secondary | ICD-10-CM | POA: Diagnosis not present

## 2022-10-31 DIAGNOSIS — E871 Hypo-osmolality and hyponatremia: Secondary | ICD-10-CM

## 2022-10-31 DIAGNOSIS — E785 Hyperlipidemia, unspecified: Secondary | ICD-10-CM

## 2022-10-31 NOTE — Patient Instructions (Signed)
Continue to HOLD the hydrochlorothiazide  Monitor blood pressure and be in touch if consistently > 140/90.  We will need to monitor your BP off the hydrochlorothiazide- may start to climb back up.

## 2022-10-31 NOTE — Progress Notes (Signed)
Established Patient Office Visit  Subjective   Patient ID: Cassandra Foster, female    DOB: 1940/05/23  Age: 82 y.o. MRN: [id number]  Chief Complaint  Patient presents with   Hospitalization Follow-up    HPI   Mlynn is seen for hospital follow-up.  She was admitted 8/27 after presenting to [facility] emergency room with nausea, vomiting, diarrhea and some confusion and extreme weakness.  Her workup revealed severe hyponatremia with sodium 114.  She has had baseline slightly low sodium usually low 130s and had been taking HCTZ.  This was obviously held.  She was treated with hypertonic saline drip which was discontinued after a few hours.  Her sodium gradually improved 128 at discharge.  Her symptoms resolved.  She states her nausea, vomiting, and diarrhea started only the day prior to admission.  She had had some headaches also on admission which have resolved since then.  She feels back to baseline at this time.  Her hypertension is treated with amlodipine 5 mg daily and she also remains on Toprol XL 25 mg daily.  She takes pravastatin 20 mg daily for hyperlipidemia.  Due for follow-up lipids.  She states she did not take her blood pressure medication today.  Her blood pressures been stable at home thus far.  Past Medical History:  Diagnosis Date   Chronic insomnia    Colon polyps    Hyperlipidemia    Hypertension    Osteoporosis    Past Surgical History:  Procedure Laterality Date   BREAST EXCISIONAL BIOPSY Left    benign, lump removed many years ago   BREAST SURGERY     1985, 1997   COLONOSCOPY  multiple   KNEE SURGERY  2009   kneecap   OTHER SURGICAL HISTORY     childbirth x 3   TUBAL LIGATION     BP dropped     reports that she has never smoked. She has never used smokeless tobacco. She reports that she does not drink alcohol and does not use drugs. family history includes Breast cancer in her mother; Cancer in her mother; Colon cancer (age of onset: 27) in her brother;  Colon cancer (age of onset: 74) in an other family member; Prostate cancer in her brother; Stroke in her father. Allergies  Allergen Reactions   Follow-Up [Calcilo Xd]    Hydrochlorothiazide Other (See Comments)    Severe hyponatremia.     Simvastatin Other (See Comments)    Body aches    Review of Systems  Constitutional:  Negative for malaise/fatigue.  Eyes:  Negative for blurred vision.  Respiratory:  Negative for shortness of breath.   Cardiovascular:  Negative for chest pain.  Gastrointestinal:  Negative for diarrhea, nausea and vomiting.  Neurological:  Negative for dizziness, weakness and headaches.      Objective:     BP (!) 150/70 (BP Location: Left Arm, Patient Position: Sitting, Cuff Size: Normal)   Pulse 76   Temp 97.9 F (36.6 C) (Oral)   Ht 5\' 3"  (1.6 m)   Wt 159 lb 12.8 oz (72.5 kg)   SpO2 99%   BMI 28.31 kg/m  BP Readings from Last 3 Encounters:  10/31/22 (!) 150/70  10/25/22 (!) 136/53  09/17/22 132/60   Wt Readings from Last 3 Encounters:  10/31/22 159 lb 12.8 oz (72.5 kg)  10/24/22 156 lb 4.9 oz (70.9 kg)  09/17/22 156 lb (70.8 kg)      Physical Exam Vitals reviewed.  Constitutional:  Appearance: She is well-developed.  Eyes:     Pupils: Pupils are equal, round, and reactive to light.  Neck:     Thyroid: No thyromegaly.     Vascular: No JVD.  Cardiovascular:     Rate and Rhythm: Normal rate and regular rhythm.     Heart sounds:     No gallop.  Pulmonary:     Effort: Pulmonary effort is normal. No respiratory distress.     Breath sounds: Normal breath sounds. No wheezing or rales.  Musculoskeletal:     Cervical back: Neck supple.     Right lower leg: No edema.     Left lower leg: No edema.  Neurological:     General: No focal deficit present.     Mental Status: She is alert.  Psychiatric:        Mood and Affect: Mood normal.        Thought Content: Thought content normal.      No results found for any visits on  10/31/22.  Last CBC Lab Results  Component Value Date   WBC 10.4 10/24/2022   HGB 11.7 (L) 10/24/2022   HCT 34.0 (L) 10/24/2022   MCV 90.4 10/24/2022   MCH 31.1 10/24/2022   RDW 12.2 10/24/2022   PLT 153 10/24/2022   Last metabolic panel Lab Results  Component Value Date   GLUCOSE 103 (H) 10/25/2022   NA 126 (L) 10/25/2022   K 4.0 10/25/2022   CL 102 10/25/2022   CO2 20 (L) 10/25/2022   BUN 11 10/25/2022   CREATININE 0.55 10/25/2022   GFRNONAA >60 10/25/2022   CALCIUM 8.0 (L) 10/25/2022   PROT 6.5 10/23/2022   ALBUMIN 3.9 10/23/2022   BILITOT 1.1 10/23/2022   ALKPHOS 73 10/23/2022   AST 35 10/23/2022   ALT 47 (H) 10/23/2022   ANIONGAP 6 10/25/2022   Last hemoglobin A1c No results found for: "HGBA1C"    The ASCVD Risk score (Arnett DK, et al., 2019) failed to calculate for the following reasons:   The 2019 ASCVD risk score is only valid for ages 81 to 12    Assessment & Plan:   #1 severe hyponatremia.  Patient was on HCTZ previously but have been stable with only mild hyponatremia for years on this dose.  Not clear why her sodium dropped that extensively. -Continue to hold HCTZ and would avoid any thiazides in the future -Would also be cautious with medication such as SSRIs -Repeat basic metabolic panel today  #2 hypertension.  Blood pressure slightly up today but she did not take her medications this morning.  Continue amlodipine 5 mg daily and Toprol-XL 25 mg daily.  Continue to monitor closely at home and be in touch if consistently greater than 140 systolic.  We may need to add additional medication with her recent discontinuation of HCTZ.  #3 hyperlipidemia treated with pravastatin 20 mg daily.  Recent liver transaminases normal.  Recheck lipid today   Return in about 3 months (around 01/30/2023).    Evelena Peat, MD

## 2022-11-01 ENCOUNTER — Encounter: Payer: Self-pay | Admitting: *Deleted

## 2022-11-01 LAB — LIPID PANEL
Cholesterol: 140 mg/dL (ref 0–200)
HDL: 47.2 mg/dL (ref >39.00)
LDL Cholesterol: 36 mg/dL (ref 0–99)
NonHDL: 92.35
Total CHOL/HDL Ratio: 3
Triglycerides: 283 mg/dL — ABNORMAL HIGH (ref 0.0–149.0)
VLDL: 56.6 mg/dL — ABNORMAL HIGH (ref 0.0–40.0)

## 2022-11-01 LAB — BASIC METABOLIC PANEL
BUN: 12 mg/dL (ref 6–23)
CO2: 26 meq/L (ref 19–32)
Calcium: 9.6 mg/dL (ref 8.4–10.5)
Chloride: 99 meq/L (ref 96–112)
Creatinine, Ser: 1.05 mg/dL (ref 0.40–1.20)
GFR: 49.5 mL/min — ABNORMAL LOW (ref >60.00)
Glucose, Bld: 98 mg/dL (ref 70–99)
Potassium: 4.5 meq/L (ref 3.5–5.1)
Sodium: 133 meq/L — ABNORMAL LOW (ref 135–145)

## 2022-11-11 ENCOUNTER — Other Ambulatory Visit: Payer: Self-pay

## 2022-11-12 ENCOUNTER — Emergency Department (HOSPITAL_COMMUNITY): Payer: Medicare PPO

## 2022-11-12 ENCOUNTER — Telehealth: Payer: Self-pay

## 2022-11-12 ENCOUNTER — Other Ambulatory Visit: Payer: Self-pay

## 2022-11-12 ENCOUNTER — Encounter (HOSPITAL_COMMUNITY): Payer: Self-pay

## 2022-11-12 ENCOUNTER — Emergency Department (HOSPITAL_COMMUNITY)
Admission: EM | Admit: 2022-11-12 | Discharge: 2022-11-12 | Disposition: A | Discharge status: Home / Self Care | Payer: Medicare PPO | Attending: Emergency Medicine | Admitting: Emergency Medicine

## 2022-11-12 DIAGNOSIS — E871 Hypo-osmolality and hyponatremia: Secondary | ICD-10-CM | POA: Insufficient documentation

## 2022-11-12 DIAGNOSIS — I10 Essential (primary) hypertension: Secondary | ICD-10-CM | POA: Insufficient documentation

## 2022-11-12 DIAGNOSIS — Z7982 Long term (current) use of aspirin: Secondary | ICD-10-CM | POA: Diagnosis not present

## 2022-11-12 DIAGNOSIS — Z79899 Other long term (current) drug therapy: Secondary | ICD-10-CM | POA: Diagnosis not present

## 2022-11-12 DIAGNOSIS — R0789 Other chest pain: Secondary | ICD-10-CM | POA: Diagnosis not present

## 2022-11-12 DIAGNOSIS — K219 Gastro-esophageal reflux disease without esophagitis: Secondary | ICD-10-CM | POA: Insufficient documentation

## 2022-11-12 DIAGNOSIS — I700 Atherosclerosis of aorta: Secondary | ICD-10-CM | POA: Diagnosis not present

## 2022-11-12 DIAGNOSIS — R079 Chest pain, unspecified: Secondary | ICD-10-CM | POA: Diagnosis not present

## 2022-11-12 LAB — CBC
HCT: 39 % (ref 36.0–46.0)
Hemoglobin: 12.7 g/dL (ref 12.0–15.0)
MCH: 31.1 pg (ref 26.0–34.0)
MCHC: 32.6 g/dL (ref 30.0–36.0)
MCV: 95.6 fL (ref 80.0–100.0)
Platelets: 155 10*3/uL (ref 150–400)
RBC: 4.08 MIL/uL (ref 3.87–5.11)
RDW: 13.2 % (ref 11.5–15.5)
WBC: 9.6 10*3/uL (ref 4.0–10.5)
nRBC: 0 % (ref 0.0–0.2)

## 2022-11-12 LAB — BASIC METABOLIC PANEL
Anion gap: 11 (ref 5–15)
BUN: 12 mg/dL (ref 8–23)
CO2: 23 mmol/L (ref 22–32)
Calcium: 9.6 mg/dL (ref 8.9–10.3)
Chloride: 98 mmol/L (ref 98–111)
Creatinine, Ser: 0.71 mg/dL (ref 0.44–1.00)
GFR, Estimated: >60 mL/min (ref >60)
Glucose, Bld: 146 mg/dL — ABNORMAL HIGH (ref 70–99)
Potassium: 4 mmol/L (ref 3.5–5.1)
Sodium: 132 mmol/L — ABNORMAL LOW (ref 135–145)

## 2022-11-12 LAB — HEPATIC FUNCTION PANEL
ALT: 41 U/L (ref 0–44)
AST: 33 U/L (ref 15–41)
Albumin: 4 g/dL (ref 3.5–5.0)
Alkaline Phosphatase: 99 U/L (ref 38–126)
Bilirubin, Direct: 0.2 mg/dL (ref 0.0–0.2)
Indirect Bilirubin: 0.6 mg/dL (ref 0.3–0.9)
Total Bilirubin: 0.8 mg/dL (ref 0.3–1.2)
Total Protein: 7.2 g/dL (ref 6.5–8.1)

## 2022-11-12 LAB — TROPONIN I (HIGH SENSITIVITY)
Troponin I (High Sensitivity): 5 ng/L (ref <18)
Troponin I (High Sensitivity): 6 ng/L (ref <18)

## 2022-11-12 LAB — LIPASE, BLOOD: Lipase: 39 U/L (ref 11–51)

## 2022-11-12 MED ORDER — DICYCLOMINE HCL 10 MG/5ML PO SOLN
10.0000 mg | Freq: Once | ORAL | Status: AC
  Administered 2022-11-12: 10 mg via ORAL
  Filled 2022-11-12: qty 5

## 2022-11-12 MED ORDER — SODIUM CHLORIDE 0.9 % IV BOLUS: 1000.0000 mL | Freq: Once | INTRAVENOUS | Status: DC

## 2022-11-12 MED ORDER — ALUM & MAG HYDROXIDE-SIMETH 200-200-20 MG/5ML PO SUSP
30.0000 mL | Freq: Once | ORAL | Status: AC
  Administered 2022-11-12: 30 mL via ORAL
  Filled 2022-11-12: qty 30

## 2022-11-12 MED ORDER — OMEPRAZOLE 20 MG PO CPDR: 20.0000 mg | DELAYED_RELEASE_CAPSULE | Freq: Every day | ORAL | 0 refills | Status: AC

## 2022-11-12 MED ORDER — SODIUM CHLORIDE 0.9 % IV BOLUS
500.0000 mL | Freq: Once | INTRAVENOUS | Status: AC
  Administered 2022-11-12: 500 mL via INTRAVENOUS

## 2022-11-12 NOTE — ED Triage Notes (Signed)
Complaining of feeling uncomfortable in the center of her chest. Was recently in hospital for hyponatremia and has been eating more things with salt since. For the better part of a week she has been having indigestion, burping, and discomfort between her breast

## 2022-11-12 NOTE — Telephone Encounter (Signed)
Pt ready for scheduling on or after 12/07/22.  Estimated out-of-pocket cost due at time of visit: $40  Primary Insurance: [employer] co-insurance: $40  Deductible: $180 out of $4000.  Eligible for co-pay program: No  Prior Auth: Approved PA#: [id number] Valid: 04/24/21-02/26/23.  This summary of benefits is an estimation of the patient's out-of-pocket cost. Exact cost may vary based on individual plan coverage.

## 2022-11-12 NOTE — ED Provider Notes (Signed)
[place] EMERGENCY DEPARTMENT AT [facility] Provider Note   CSN: [id number] Arrival date & time: 11/12/22  0008     History  Chief Complaint  Patient presents with   Chest Pain   Cassandra Foster is a 82 y.o. female with history of hypertension on hydrochlorothiazide and recent admission for severe hyponatremia down to 114 admitted on hypertonic saline, HCTZ discontinued, started on amlodipine and Toprol XL.  She returns to the emergency room today with concern for burning pain in her epigastrium, belching times several days.  States that today when she was experiencing it this evening she took her blood pressure and noted it to be elevated to 180 systolic prompting her visit to the ED.  States has been increasing her sodium intake at home following her admission.  Additionally mL history is history of hypertension, hyperlipidemia.  She is not anticoagulated.  HPI     Home Medications Prior to Admission medications   Medication Sig Start Date End Date Taking? Authorizing Provider  albuterol (VENTOLIN HFA) 108 (90 Base) MCG/ACT inhaler Inhale 2 puffs into the lungs every 4 (four) hours as needed for wheezing or shortness of breath. 04/05/21   Burchette, Elberta Fortis, MD  amLODipine (NORVASC) 5 MG tablet TAKE ONE TABLET DAILY 10/10/22   Burchette, Elberta Fortis, MD  aspirin EC 81 MG tablet Take 81 mg by mouth daily.    [provider]  Cholecalciferol (VITAMIN D3) 5000 UNITS CAPS Take 1 capsule by mouth daily.    [provider]  Clobetasol Propionate Emulsion (OLUX-E) 0.05 % topical foam Apply 1 Application topically 2 (two) times daily. 08/28/21   Burchette, Elberta Fortis, MD  denosumab (PROLIA) 60 MG/ML SOSY injection Inject 60 mg into the skin every 6 (six) months.    [provider]  diclofenac Sodium (VOLTAREN) 1 % GEL Apply 4 g topically 4 (four) times daily. To affected joint. 01/08/19   Rodolph Bong, MD  ketoconazole (NIZORAL) 2 % cream Apply 1 Application  topically daily. 09/17/22   Burchette, Elberta Fortis, MD  metoprolol succinate (TOPROL-XL) 25 MG 24 hr tablet Take 1 tablet (25 mg total) by mouth daily. 08/17/22   Burchette, Elberta Fortis, MD  mometasone (ELOCON) 0.1 % lotion APPLY TOPICALLY ONCE A DAY AS DIRECTED 11/25/19   Burchette, Elberta Fortis, MD  Multiple Vitamins-Minerals (PRESERVISION AREDS 2 PO) Take by mouth daily.    [provider]  polyethylene glycol (MIRALAX / GLYCOLAX) 17 g packet Take 17 g by mouth daily as needed for mild constipation.    [provider]  pravastatin (PRAVACHOL) 20 MG tablet TAKE 1 TABLET DAILY 05/24/22   Burchette, Elberta Fortis, MD  traZODone (DESYREL) 50 MG tablet TAKE TWO TABLETS BY MOUTH AT BEDTIME 02/23/22   Burchette, Elberta Fortis, MD      Allergies    Follow-up [calcilo xd], Hydrochlorothiazide, and Simvastatin    Review of Systems   Review of Systems  Constitutional: Negative.   HENT: Negative.    Respiratory: Negative.    Cardiovascular:  Positive for chest pain. Negative for palpitations and leg swelling.  Gastrointestinal:  Positive for abdominal pain.    Physical Exam Updated Vital Signs BP (!) 151/61 (BP Location: Left Arm)   Pulse 72   Temp 97.9 F (36.6 C)   Resp 18   Ht 5\' 3"  (1.6 m)   Wt 73.5 kg   SpO2 100%   BMI 28.70 kg/m  Physical Exam Vitals and nursing note reviewed.  Constitutional:  Appearance: She is not ill-appearing or toxic-appearing.     Comments: Appears younger than her stated age  HENT:     Head: Normocephalic and atraumatic.     Mouth/Throat:     Mouth: Mucous membranes are moist.     Pharynx: No oropharyngeal exudate or posterior oropharyngeal erythema.  Eyes:     General:        Right eye: No discharge.        Left eye: No discharge.     Conjunctiva/sclera: Conjunctivae normal.  Cardiovascular:     Rate and Rhythm: Normal rate and regular rhythm.     Pulses: Normal pulses.     Heart sounds: Normal heart sounds.  Pulmonary:     Effort: Pulmonary effort  is normal. No respiratory distress.     Breath sounds: Normal breath sounds. No wheezing or rales.  Chest:     Chest wall: No mass, tenderness or edema.  Abdominal:     General: Bowel sounds are normal. There is no distension.     Palpations: Abdomen is soft.     Tenderness: There is no abdominal tenderness.  Musculoskeletal:        General: No deformity.     Cervical back: Neck supple.     Right lower leg: No tenderness. No edema.     Left lower leg: No tenderness. No edema.  Skin:    General: Skin is warm and dry.     Capillary Refill: Capillary refill takes less than 2 seconds.  Neurological:     General: No focal deficit present.     Mental Status: She is alert. Mental status is at baseline.  Psychiatric:        Mood and Affect: Mood normal.     ED Results / Procedures / Treatments   Labs (all labs ordered are listed, but only abnormal results are displayed) Labs Reviewed  BASIC METABOLIC PANEL - Abnormal; Notable for the following components:      Result Value   Sodium 132 (*)    Glucose, Bld 146 (*)    All other components within normal limits  CBC  TROPONIN I (HIGH SENSITIVITY)  TROPONIN I (HIGH SENSITIVITY)    EKG None  Radiology DG Chest 2 View  Result Date: 11/12/2022 CLINICAL DATA:  Chest pain for 2 weeks. EXAM: CHEST - 2 VIEW COMPARISON:  None Available. FINDINGS: The heart size and mediastinal contours are within normal limits. There is atherosclerotic calcification of the aorta. No consolidation, effusion, or pneumothorax. Degenerative changes are present in the thoracic spine. No acute osseous abnormality. IMPRESSION: No active cardiopulmonary disease. Electronically Signed   By: Thornell Sartorius M.D.   On: 11/12/2022 00:34    Procedures Procedures    Medications Ordered in ED Medications - No data to display  ED Course/ Medical Decision Making/ A&P                                 Medical Decision Making 82 year old female who presents with  concern for burning epigastric pain and belching.  Hypertensive on intake, vital signs normal.  Cardiopulmonary and abdominal signs are benign.  Patient well-appearing, no peripheral edema.  No abdominal tenderness palpation.  DDx includes but limited to GERD, ACS, PE, pleural fusion, pneumonia, pneumothorax, gastritis.  Amount and/or Complexity of Data Reviewed Labs: ordered.    Details:  CBC without leukocytosis or anemia, BMP with mild hyponatremia 132 .  Lipase is normal, troponin is negative x2.  Hepatic function panel is normal. Radiology: ordered.    Details: Chest x-ray negative for acute cardiopulmonary disease. ECG/medicine tests:     Details: EKG with normal sinus rhythm without ischemic changes  Risk OTC drugs. Prescription drug management.   Patient manage GI cocktail with improvement of her symptoms.   Clinical concern for emergent underlying etiology of this patient symptoms that would warrant further ED workup or inpatient management is exceedingly low.  Clinical picture most consistent with GERD.  Will discharge with prescription for PPI.  Recommend close outpatient follow-up with her PCP for monitoring of her hyponatremia.  Hermina and her son voiced understanding of her medical evaluation and treatment plan. Each of their questions answered to their expressed satisfaction.  Return precautions were given.  Patient is well-appearing, stable, and was discharged in good condition.  This chart was dictated using voice recognition software, Dragon. Despite the best efforts of this provider to proofread and correct errors, errors may still occur which can change documentation meaning.      Final Clinical Impression(s) / ED Diagnoses Final diagnoses:  None    Rx / DC Orders ED Discharge Orders     None         Sherrilee Gilles 11/12/22 0604    Nira Conn, MD 11/12/22 [phone]

## 2022-11-12 NOTE — Discharge Instructions (Addendum)
Patient ER today for the burning chest.  Work was very reassuring, does not appear to be related to stress in your heart.  Please take the prescribed medication for reflux and follow-up with your primary care doctor.  Please continue to follow-up with your sodium as well.  Return to the ER with any new severe symptoms.

## 2022-11-15 ENCOUNTER — Ambulatory Visit (INDEPENDENT_AMBULATORY_CARE_PROVIDER_SITE_OTHER): Payer: Medicare PPO

## 2022-11-15 ENCOUNTER — Encounter: Payer: Self-pay | Admitting: Adult Health

## 2022-11-15 ENCOUNTER — Telehealth (INDEPENDENT_AMBULATORY_CARE_PROVIDER_SITE_OTHER): Payer: Medicare PPO | Admitting: Adult Health

## 2022-11-15 VITALS — Ht 63.0 in | Wt 162.0 lb

## 2022-11-15 DIAGNOSIS — U071 COVID-19: Secondary | ICD-10-CM

## 2022-11-15 DIAGNOSIS — R6889 Other general symptoms and signs: Secondary | ICD-10-CM | POA: Diagnosis not present

## 2022-11-15 LAB — POC COVID19 BINAXNOW: SARS Coronavirus 2 Ag: POSITIVE — AB

## 2022-11-15 LAB — POCT INFLUENZA A/B
Influenza A, POC: NEGATIVE
Influenza B, POC: NEGATIVE

## 2022-11-15 LAB — POCT RAPID STREP A (OFFICE): Rapid Strep A Screen: NEGATIVE

## 2022-11-15 MED ORDER — NIRMATRELVIR/RITONAVIR (PAXLOVID)TABLET: 3.0000 | ORAL_TABLET | Freq: Two times a day (BID) | ORAL | 0 refills | Status: AC

## 2022-11-15 NOTE — Progress Notes (Signed)
Virtual Visit via Video Note  I connected with Cassandra Foster  on 11/15/22 at 11:00 AM EDT by a video enabled telemedicine application and verified that I am speaking with the correct person using two identifiers.  Location patient: home Location provider:work or home office Persons participating in the virtual visit: patient, provider  I discussed the limitations of evaluation and management by telemedicine and the availability of in person appointments. The patient expressed understanding and agreed to proceed.   HPI: 82 year old female who  has a past medical history of Chronic insomnia, Colon polyps, Hyperlipidemia, Hypertension, and Osteoporosis.  She is a patient of Dr. Caryl Never who I am seeing today for an acute issue. She reports that over the last few days she has been experiencing fever up to 100.2, non productive cough, chills, and headache. She denies shortness of breath/wheezing vomiting and diarrhea.    ROS: See pertinent positives and negatives per HPI.  Past Medical History:  Diagnosis Date   Chronic insomnia    Colon polyps    Hyperlipidemia    Hypertension    Osteoporosis     Past Surgical History:  Procedure Laterality Date   BREAST EXCISIONAL BIOPSY Left    benign, lump removed many years ago   BREAST SURGERY     1985, 1997   COLONOSCOPY  multiple   KNEE SURGERY  2009   kneecap   OTHER SURGICAL HISTORY     childbirth x 3   TUBAL LIGATION     BP dropped     Family History  Problem Relation Age of Onset   Cancer Mother        breast   Breast cancer Mother    Stroke Father    Colon cancer Brother 4   Prostate cancer Brother    Colon cancer Other 24       Current Outpatient Medications:    albuterol (VENTOLIN HFA) 108 (90 Base) MCG/ACT inhaler, Inhale 2 puffs into the lungs every 4 (four) hours as needed for wheezing or shortness of breath., Disp: 1 each, Rfl: 0   amLODipine (NORVASC) 5 MG tablet, TAKE ONE TABLET DAILY, Disp: 90 tablet, Rfl:  1   aspirin EC 81 MG tablet, Take 81 mg by mouth daily., Disp: , Rfl:    Cholecalciferol (VITAMIN D3) 5000 UNITS CAPS, Take 1 capsule by mouth daily., Disp: , Rfl:    Clobetasol Propionate Emulsion (OLUX-E) 0.05 % topical foam, Apply 1 Application topically 2 (two) times daily., Disp: 100 g, Rfl: 1   denosumab (PROLIA) 60 MG/ML SOSY injection, Inject 60 mg into the skin every 6 (six) months., Disp: , Rfl:    diclofenac Sodium (VOLTAREN) 1 % GEL, Apply 4 g topically 4 (four) times daily. To affected joint. (Patient taking differently: Apply 2 g topically daily as needed (for joint pain).), Disp: 100 g, Rfl: 11   ketoconazole (NIZORAL) 2 % cream, Apply 1 Application topically daily., Disp: 60 g, Rfl: 1   metoprolol succinate (TOPROL-XL) 25 MG 24 hr tablet, Take 1 tablet (25 mg total) by mouth daily., Disp: 90 tablet, Rfl: 3   mometasone (ELOCON) 0.1 % lotion, APPLY TOPICALLY ONCE A DAY AS DIRECTED, Disp: 60 mL, Rfl: 1   Multiple Vitamins-Minerals (PRESERVISION AREDS 2 PO), Take 1 capsule by mouth in the morning and at bedtime., Disp: , Rfl:    nirmatrelvir/ritonavir (PAXLOVID) 20 x 150 MG & 10 x 100MG  TABS, Take 3 tablets by mouth 2 (two) times daily for 5 days. (Take  nirmatrelvir 150 mg two tablets twice daily for 5 days and ritonavir 100 mg one tablet twice daily for 5 days) Patient GFR is <60, Disp: 30 tablet, Rfl: 0   omeprazole (PRILOSEC) 20 MG capsule, Take 1 capsule (20 mg total) by mouth daily for 14 days., Disp: 14 capsule, Rfl: 0   pravastatin (PRAVACHOL) 20 MG tablet, TAKE 1 TABLET DAILY (Patient taking differently: Take 20 mg by mouth daily.), Disp: 90 tablet, Rfl: 1   traZODone (DESYREL) 50 MG tablet, TAKE TWO TABLETS BY MOUTH AT BEDTIME (Patient taking differently: Take 50 mg by mouth at bedtime as needed for sleep.), Disp: 180 tablet, Rfl: 1  EXAM:  VITALS per patient if applicable:  GENERAL: alert, oriented, appears well and in no acute distress  HEENT: atraumatic, conjunttiva  clear, no obvious abnormalities on inspection of external nose and ears  NECK: normal movements of the head and neck  LUNGS: on inspection no signs of respiratory distress, breathing rate appears normal, no obvious gross SOB, gasping or wheezing  CV: no obvious cyanosis  MS: moves all visible extremities without noticeable abnormality  PSYCH/NEURO: pleasant and cooperative, no obvious depression or anxiety, speech and thought processing grossly intact  ASSESSMENT AND PLAN:  Discussed the following assessment and plan:  1. Flu-like symptoms - POC Flu and Strep Negative  - POC Covid - Positive    2. COVID-19 virus infection - Will start on Paxlovid due to age - Advised to stay hydrated and rest - Follow up if symptoms worsen or go to the ER - nirmatrelvir/ritonavir (PAXLOVID) 20 x 150 MG & 10 x 100MG  TABS; Take 3 tablets by mouth 2 (two) times daily for 5 days. (Take nirmatrelvir 150 mg two tablets twice daily for 5 days and ritonavir 100 mg one tablet twice daily for 5 days) Patient GFR is <60  Dispense: 30 tablet; Refill: 0      I discussed the assessment and treatment plan with the patient. The patient was provided an opportunity to ask questions and all were answered. The patient agreed with the plan and demonstrated an understanding of the instructions.   The patient was advised to call back or seek an in-person evaluation if the symptoms worsen or if the condition fails to improve as anticipated.   Shirline Frees, NP

## 2022-11-16 ENCOUNTER — Ambulatory Visit: Payer: Medicare PPO

## 2022-11-19 ENCOUNTER — Other Ambulatory Visit: Payer: Self-pay | Admitting: Family Medicine

## 2022-11-26 ENCOUNTER — Ambulatory Visit: Payer: Medicare PPO | Admitting: Family Medicine

## 2022-11-26 ENCOUNTER — Encounter: Payer: Self-pay | Admitting: Family Medicine

## 2022-11-26 VITALS — BP 148/85 | HR 66 | Temp 98.4°F | Ht 63.0 in | Wt 154.4 lb

## 2022-11-26 DIAGNOSIS — I10 Essential (primary) hypertension: Secondary | ICD-10-CM

## 2022-11-26 DIAGNOSIS — M5431 Sciatica, right side: Secondary | ICD-10-CM | POA: Diagnosis not present

## 2022-11-26 MED ORDER — MELOXICAM 15 MG PO TABS: 15.0000 mg | ORAL_TABLET | Freq: Every day | ORAL | 0 refills | Status: DC

## 2022-11-26 MED ORDER — CYCLOBENZAPRINE HCL 5 MG PO TABS
5.0000 mg | ORAL_TABLET | Freq: Every evening | ORAL | 0 refills | Status: DC | PRN
Start: 2022-11-26 — End: 2022-12-05

## 2022-11-26 NOTE — Progress Notes (Signed)
Established Patient Office Visit   Subjective  Patient ID: Cassandra Foster, female    DOB: 10/16/1940  Age: 82 y.o. MRN: [id number]  Chief Complaint  Patient presents with   Hip Pain    Patient came in today for right hip pain, started 4 days ago, rate of pain 10 out of 10     Patient is an 82 year old female followed by Dr. Caryl Never and seen for acute concern.  Patient recovering from COVID-19 virus infection 9/19.  Patient seen at [facility] 9/26 for leg pain.  Given Medrol Dosepak.  Patient endorses continued burning and tingling in anterior LEs and posterior right buttock and thigh pain, 10/10.  Patient tried Tylenol, ibuprofen, soaking in tub.  Patient denies any falls or injury.  Patient endorses prolonged standing peeling carrots and taking care of her sister and husband.  Hip Pain     Past Medical History:  Diagnosis Date   Chronic insomnia    Colon polyps    Hyperlipidemia    Hypertension    Osteoporosis    Past Surgical History:  Procedure Laterality Date   BREAST EXCISIONAL BIOPSY Left    benign, lump removed many years ago   BREAST SURGERY     1985, 1997   COLONOSCOPY  multiple   KNEE SURGERY  2009   kneecap   OTHER SURGICAL HISTORY     childbirth x 3   TUBAL LIGATION     BP dropped    Social History   Tobacco Use   Smoking status: Never   Smokeless tobacco: Never  Vaping Use   Vaping status: Never Used  Substance Use Topics   Alcohol use: No   Drug use: No   Family History  Problem Relation Age of Onset   Cancer Mother        breast   Breast cancer Mother    Stroke Father    Colon cancer Brother 41   Prostate cancer Brother    Colon cancer Other 61   Allergies  Allergen Reactions   Follow-Up [Calcilo Xd]    Hydrochlorothiazide Other (See Comments)    Severe hyponatremia.     Simvastatin Other (See Comments)    Body aches      ROS Negative unless stated above    Objective:     BP (!) 160/80 (BP Location: Left Arm, Patient Position:  Sitting, Cuff Size: Normal) Comment: patient is in pain  Pulse 66   Temp 98.4 F (36.9 C) (Oral)   Ht 5\' 3"  (1.6 m)   Wt 154 lb 6.4 oz (70 kg)   SpO2 98%   BMI 27.35 kg/m  BP Readings from Last 3 Encounters:  11/26/22 (!) 160/80  11/12/22 (!) 170/72  11/02/22 (!) 150/70   Wt Readings from Last 3 Encounters:  11/26/22 154 lb 6.4 oz (70 kg)  11/15/22 162 lb (73.5 kg)  11/12/22 162 lb (73.5 kg)      Physical Exam Constitutional:      General: She is not in acute distress.    Appearance: Normal appearance.  HENT:     Head: Normocephalic and atraumatic.     Nose: Nose normal.     Mouth/Throat:     Mouth: Mucous membranes are moist.  Cardiovascular:     Rate and Rhythm: Normal rate and regular rhythm.     Heart sounds: Normal heart sounds. No murmur heard.    No gallop.  Pulmonary:     Effort: Pulmonary effort is normal. No  respiratory distress.     Breath sounds: Normal breath sounds. No wheezing, rhonchi or rales.  Musculoskeletal:     Comments: No TTP of cervical, thoracic, lumbar spine, or paraspinal muscles bilaterally.  TTP of right sciatic nerve and mid right buttock.  No LE weakness, bilateral LE strength 5/5.  Skin:    General: Skin is warm and dry.  Neurological:     Mental Status: She is alert and oriented to person, place, and time.      No results found for any visits on 11/26/22.    Assessment & Plan:  Sciatica of right side -     Meloxicam; Take 1 tablet (15 mg total) by mouth daily.  Dispense: 15 tablet; Refill: 0 -     Cyclobenzaprine HCl; Take 1 tablet (5 mg total) by mouth at bedtime as needed for muscle spasms.  Dispense: 10 tablet; Refill: 0  Essential hypertension   Patient seen for continued right-sided sciatic symptoms.  Advised to complete Medrol Dosepak as prescribed by UC on 11/22/2022.  Discussed likely duration of symptoms.  Discussed heat, stretching exercises.  Provided handout.  NSAIDs as needed.  Limited Rx for muscle relaxer given,  discussed r/b/a.  For continued or worsening symptoms consider PT and/or sports medicine referral.  Return if symptoms worsen or fail to improve.   Deeann Saint, MD

## 2022-11-26 NOTE — Patient Instructions (Signed)
Prescription for Mobic medication for pain sent to pharmacy.  You can take 1 tab daily to help with symptoms.  A limited prescription for Flexeril for muscle relaxer was also sent to the pharmacy.  Use this medication sparingly as it can sometimes cause drowsiness and increase your risk of falling.  Take this at night if needed.  You can also use heat and topical medications to help with symptoms.  You were given stretching exercises that can help with your symptoms.  If needed referral can be placed for physical therapy.

## 2022-11-28 ENCOUNTER — Telehealth: Payer: Self-pay | Admitting: Family Medicine

## 2022-11-28 NOTE — Telephone Encounter (Signed)
Pt was seen by Dr. Salomon Fick on 11/26/22. Pt called again, requesting pain medication for her sciatic pain.  Pt states she is in a lot of pain and can hardly bare it.  Pt informed MD is OOO.  Pt was assured we would try our best to get Dr. Salomon Fick or another provider to assist.

## 2022-11-28 NOTE — Telephone Encounter (Signed)
Pt seen dr banks on 11-26-2022 and is in pain and would like pain medication [facility] [place], [place] - [address] Phone: [phone]  Fax: [phone]

## 2022-11-30 NOTE — Telephone Encounter (Signed)
A prescription for pain medication was sent to pharmacy at time of visit.  Patient advised to try various other things during visit to help with pain however she was uninterested.  Stretching, heat, topical medications such as Biofreeze, IcyHot, Aspercreme, etc. can be helpful along with an anti-inflammatory medication.  A referral to physical therapy was also placed, should be able to set up appointment soon.

## 2022-12-03 ENCOUNTER — Other Ambulatory Visit: Payer: Self-pay | Admitting: Family Medicine

## 2022-12-03 ENCOUNTER — Telehealth: Payer: Self-pay | Admitting: Family Medicine

## 2022-12-03 DIAGNOSIS — M5431 Sciatica, right side: Secondary | ICD-10-CM

## 2022-12-03 NOTE — Telephone Encounter (Signed)
Pt should not be out of meds if taken as prescribed.  Will need to be seen by PCP for further refills.

## 2022-12-03 NOTE — Telephone Encounter (Signed)
Pt called to ask if someone will be sending her some pain medication or not?  Please advise.

## 2022-12-03 NOTE — Telephone Encounter (Signed)
Blood pressure medication changed and blood pressure has been elevated 150-160-198. Also says she is having sciatic nerve pain and requesting medication (mentioned a patch and flexaril) says she can't get out of bed.

## 2022-12-03 NOTE — Telephone Encounter (Signed)
Pt is calling and her bp 190/81 and pulse 91 pt was transfer to triage nurse

## 2022-12-03 NOTE — Telephone Encounter (Signed)
Spoke to patient. She reports she finished her anti-inflammatory and muscle relaxer. She had tried stretching and heat and icyhot cream. It didn't relief her pain.   Pt has upcoming visit with Dr. Caryl Never on Wed. 12/03/2022.   Pt states meloxicam didn't help but muscle relaxer did helps with her pain. Would like a refill on the prescription. Please advise.

## 2022-12-03 NOTE — Telephone Encounter (Signed)
Spoke with patient, she is aware.

## 2022-12-05 ENCOUNTER — Encounter: Payer: Self-pay | Admitting: Family Medicine

## 2022-12-05 ENCOUNTER — Ambulatory Visit: Payer: Medicare PPO | Admitting: Family Medicine

## 2022-12-05 ENCOUNTER — Ambulatory Visit: Payer: Medicare PPO

## 2022-12-05 VITALS — BP 158/76 | HR 92 | Temp 98.1°F | Ht 63.0 in | Wt 151.0 lb

## 2022-12-05 DIAGNOSIS — M51369 Other intervertebral disc degeneration, lumbar region without mention of lumbar back pain or lower extremity pain: Secondary | ICD-10-CM | POA: Diagnosis not present

## 2022-12-05 DIAGNOSIS — M5431 Sciatica, right side: Secondary | ICD-10-CM

## 2022-12-05 DIAGNOSIS — M810 Age-related osteoporosis without current pathological fracture: Secondary | ICD-10-CM | POA: Diagnosis not present

## 2022-12-05 DIAGNOSIS — M4316 Spondylolisthesis, lumbar region: Secondary | ICD-10-CM | POA: Diagnosis not present

## 2022-12-05 DIAGNOSIS — M47816 Spondylosis without myelopathy or radiculopathy, lumbar region: Secondary | ICD-10-CM | POA: Diagnosis not present

## 2022-12-05 MED ORDER — DENOSUMAB 60 MG/ML ~~LOC~~ SOSY
60.0000 mg | PREFILLED_SYRINGE | Freq: Once | SUBCUTANEOUS | Status: AC
Start: 2022-12-05 — End: 2022-12-05
  Administered 2022-12-05: 60 mg via SUBCUTANEOUS

## 2022-12-05 MED ORDER — GABAPENTIN 300 MG PO CAPS: 300.0000 mg | ORAL_CAPSULE | Freq: Three times a day (TID) | ORAL | 3 refills | Status: DC

## 2022-12-05 MED ORDER — VALSARTAN 80 MG PO TABS: 80.0000 mg | ORAL_TABLET | Freq: Every day | ORAL | 3 refills | Status: DC

## 2022-12-05 NOTE — Telephone Encounter (Signed)
Pt was seen with provider in office today.

## 2022-12-05 NOTE — Patient Instructions (Signed)
Start the Valsartan for the high blood pressure  Start the Gabapentin 300 mg once daily and gradually increase to one three time daily over the next few days  Will set up MRI scan to further assess.

## 2022-12-05 NOTE — Progress Notes (Signed)
Established Patient Office Visit  Subjective   Patient ID: Cassandra Foster, female    DOB: Jun 08, 1940  Age: 82 y.o. MRN: [id number]  Chief Complaint  Patient presents with   Follow-up    Pt is following up on R hip pain. Taking Tylenol and ibuprofen. No relief.    Hip Pain   Leg Pain    Pt c/o pain on both anterior of lower leg. Described burning pain.    Medical Management of Chronic Issues    Discuss elevated BP.    HPI   Cassandra Foster is here for the following several items  Cassandra Foster has been battling with some severe pain right lower extremity.  Pain seems to be worse mostly in the leg region.  Some pain in the hip area and low back but mostly lower leg.  Cassandra Foster describes this as a burning quality anterior lower leg.  Cassandra Foster was seen at urgent care and given prednisone which did not help much.  Cassandra Foster was then prescribed meloxicam and muscle relaxer which did not seem to help either.  Cassandra Foster states Cassandra Foster has been basically in bed for the past 8 days.  Usually very active.  No significant weakness lower extremity.  No urine or stool incontinence.  Cassandra Foster took one of her husbands gabapentin which seemed to help significantly more.  Pain has been very intense at times  Cassandra Foster has hypertension history and currently on amlodipine and metoprolol.  Was taken off losartan HCTZ recently secondary to severe hyponatremia.  Blood pressures been up consistently since then.  Possibly exacerbated by pain.  Cassandra Foster has been extremely stressed with caring for her husband and sister who has advanced dementia.  Cassandra Foster is to the point of feeling like Cassandra Foster has to look at placement options soon for her sister.    Past Medical History:  Diagnosis Date   Chronic insomnia    Colon polyps    Hyperlipidemia    Hypertension    Osteoporosis    Past Surgical History:  Procedure Laterality Date   BREAST EXCISIONAL BIOPSY Left    benign, lump removed many years ago   BREAST SURGERY     1985, 1997   COLONOSCOPY  multiple   KNEE SURGERY   2009   kneecap   OTHER SURGICAL HISTORY     childbirth x 3   TUBAL LIGATION     BP dropped     reports that Cassandra Foster has never smoked. Cassandra Foster has never used smokeless tobacco. Cassandra Foster reports that Cassandra Foster does not drink alcohol and does not use drugs. family history includes Breast cancer in her mother; Cancer in her mother; Colon cancer (age of onset: 64) in her brother; Colon cancer (age of onset: 67) in an other family member; Prostate cancer in her brother; Stroke in her father. Allergies  Allergen Reactions   Follow-Up [Calcilo Xd]    Hydrochlorothiazide Other (See Comments)    Severe hyponatremia.     Simvastatin Other (See Comments)    Body aches    Review of Systems  Constitutional:  Negative for chills, fever and malaise/fatigue.  Eyes:  Negative for blurred vision.  Respiratory:  Negative for shortness of breath.   Cardiovascular:  Negative for chest pain.  Gastrointestinal:  Negative for abdominal pain.  Neurological:  Negative for dizziness, weakness and headaches.      Objective:     BP (!) 158/76 (BP Location: Left Arm, Patient Position: Sitting, Cuff Size: Normal)   Pulse 92   Temp 98.1 F (  36.7 C) (Oral)   Ht 5\' 3"  (1.6 m)   Wt 151 lb (68.5 kg)   SpO2 98%   BMI 26.75 kg/m  BP Readings from Last 3 Encounters:  12/05/22 (!) 158/76  11/26/22 (!) 148/85  11/12/22 (!) 170/72   Wt Readings from Last 3 Encounters:  12/05/22 151 lb (68.5 kg)  11/26/22 154 lb 6.4 oz (70 kg)  11/15/22 162 lb (73.5 kg)      Physical Exam Vitals reviewed.  Constitutional:      Appearance: Normal appearance.  Cardiovascular:     Rate and Rhythm: Normal rate and regular rhythm.  Pulmonary:     Effort: Pulmonary effort is normal.     Breath sounds: Normal breath sounds. No wheezing or rales.  Musculoskeletal:     Comments: Straight leg raise negative bilaterally.  No spinal tenderness in the lumbar region.  Neurological:     Mental Status: Cassandra Foster is alert.     Comments: Difficult to  elicit reflexes ankle and knee bilaterally.  Cassandra Foster has good strength with hip flexion, knee extension, plantarflexion, and dorsiflexion bilaterally.  Normal sensory function to touch throughout lower extremities.  Full range of motion right hip with no pain      No results found for any visits on 12/05/22.  Last CBC Lab Results  Component Value Date   WBC 9.6 11/12/2022   HGB 12.7 11/12/2022   HCT 39.0 11/12/2022   MCV 95.6 11/12/2022   MCH 31.1 11/12/2022   RDW 13.2 11/12/2022   PLT 155 11/12/2022   Last metabolic panel Lab Results  Component Value Date   GLUCOSE 146 (H) 11/12/2022   NA 132 (L) 11/12/2022   K 4.0 11/12/2022   CL 98 11/12/2022   CO2 23 11/12/2022   BUN 12 11/12/2022   CREATININE 0.71 11/12/2022   GFRNONAA >60 11/12/2022   CALCIUM 9.6 11/12/2022   PROT 7.2 11/12/2022   ALBUMIN 4.0 11/12/2022   BILITOT 0.8 11/12/2022   ALKPHOS 99 11/12/2022   AST 33 11/12/2022   ALT 41 11/12/2022   ANIONGAP 11 11/12/2022   Last lipids Lab Results  Component Value Date   CHOL 140 10/31/2022   HDL 47.20 10/31/2022   LDLCALC 36 10/31/2022   TRIG 283.0 (H) 10/31/2022   CHOLHDL 3 10/31/2022   Last hemoglobin A1c No results found for: "HGBA1C" Last thyroid functions Lab Results  Component Value Date   TSH 1.57 10/17/2021      The ASCVD Risk score (Arnett DK, et al., 2019) failed to calculate for the following reasons:   The 2019 ASCVD risk score is only valid for ages 53 to 31    Assessment & Plan:   #1 progressive right sided sciatica symptoms.  Not relieved with meloxicam, prednisone, muscle relaxer.  Pain has been quite intense and incapacitating at times.  Cassandra Foster did get some improvement with her husbands gabapentin.  -Obtain plain x-rays lumbar spine to further assess -Will probably need to get MRI to further check -Start gabapentin 300 mg nightly and gradually increase to 300 mg 3 times daily as tolerated  #2 hypertension poorly controlled.  Recent  discontinuation of HCTZ secondary to severe hyponatremia.  Patient currently on amlodipine 5 mg and metoprolol extended release 25 mg daily.  Add plain valsartan 80 mg once daily.  Cassandra Foster has follow-up scheduled November 4 reassess  #3 social stressors.  Cassandra Foster is extremely burdened with caring for her husband who has multiple medical issues as well as her sister who  has advancing dementia and will need placement soon likely.  They are checking into facilities now for her possible care   No follow-ups on file.    Evelena Peat, MD

## 2022-12-06 NOTE — Telephone Encounter (Signed)
Pt was seen with pcp yesterday.

## 2022-12-11 ENCOUNTER — Telehealth: Payer: Self-pay | Admitting: Family Medicine

## 2022-12-11 NOTE — Telephone Encounter (Signed)
Pt says her blood pressure still remains high,even with the medication. Says she wanted the doc to know

## 2022-12-11 NOTE — Telephone Encounter (Signed)
I spoke with the patient and she reported Systolic numbers ranging around 160-150 and Diastolic ranging from 70-80

## 2022-12-12 MED ORDER — VALSARTAN 160 MG PO TABS: 160.0000 mg | ORAL_TABLET | Freq: Every day | ORAL | 0 refills | Status: DC

## 2022-12-12 NOTE — Telephone Encounter (Signed)
Rx sent and patient is aware.

## 2022-12-12 NOTE — Addendum Note (Signed)
Addended by: Laneta Simmers L on: 12/12/2022 11:08 AM   Modules accepted: Orders

## 2022-12-18 ENCOUNTER — Telehealth: Payer: Self-pay | Admitting: *Deleted

## 2022-12-18 ENCOUNTER — Ambulatory Visit: Payer: Medicare PPO | Admitting: Family Medicine

## 2022-12-18 NOTE — Telephone Encounter (Signed)
Transition Care Management Unsuccessful Follow-up Telephone Call  Date of discharge and from where:  [facility] [place]  11/12/2022  Attempts:  1st Attempt  Reason for unsuccessful TCM follow-up call:  Left voice message

## 2022-12-18 NOTE — Telephone Encounter (Signed)
Transition Care Management Unsuccessful Follow-up Telephone Call  Date of discharge and from where:  [facility] [place]  11/12/2022  Attempts:  2nd Attempt  Reason for unsuccessful TCM follow-up call:  No answer/busy

## 2022-12-19 ENCOUNTER — Ambulatory Visit: Payer: Medicare PPO | Admitting: Family Medicine

## 2022-12-19 ENCOUNTER — Telehealth: Payer: Self-pay | Admitting: Family Medicine

## 2022-12-19 DIAGNOSIS — M5416 Radiculopathy, lumbar region: Secondary | ICD-10-CM

## 2022-12-19 NOTE — Telephone Encounter (Signed)
Pt called in and stated Dr. Caryl Never had said he would order an MRI for her. She would like to know where that MRI will take place.

## 2022-12-20 NOTE — Telephone Encounter (Signed)
I have ordered the MRI.   Will try to get [facility] imaging center which will be much closer for her since she lives in [place].  Kristian Covey MD [facility]

## 2022-12-20 NOTE — Telephone Encounter (Signed)
Patient is aware of the message below and voiced understanding

## 2022-12-21 ENCOUNTER — Ambulatory Visit: Payer: Medicare PPO | Admitting: Family Medicine

## 2022-12-25 ENCOUNTER — Ambulatory Visit: Payer: Medicare PPO | Admitting: Family Medicine

## 2022-12-25 ENCOUNTER — Encounter: Payer: Self-pay | Admitting: Family Medicine

## 2022-12-25 VITALS — BP 140/70 | HR 69 | Temp 97.8°F | Ht 63.0 in | Wt 160.4 lb

## 2022-12-25 DIAGNOSIS — I10 Essential (primary) hypertension: Secondary | ICD-10-CM

## 2022-12-25 DIAGNOSIS — M5431 Sciatica, right side: Secondary | ICD-10-CM | POA: Diagnosis not present

## 2022-12-25 DIAGNOSIS — E871 Hypo-osmolality and hyponatremia: Secondary | ICD-10-CM

## 2022-12-25 NOTE — Patient Instructions (Signed)
Let me know if you have not heard back about MRI within one week.   Back down on the Ibuprofen, as discussed.

## 2022-12-25 NOTE — Progress Notes (Signed)
Established Patient Office Visit  Subjective   Patient ID: SMITH ARMS, female    DOB: February 26, 1941  Age: 82 y.o. MRN: [id number]  Chief Complaint  Patient presents with   Medical Management of Chronic Issues    HPI   Ms. Stogner is seen for follow-up regarding her ongoing back pain with right sciatica type symptoms.  We obtained plain films which is still to be over read.  She called back with some continued ongoing severe pain at times in spite of gabapentin, Tylenol, and ibuprofen.  We have ordered MRI lumbar spine which is still pending.  She is current on gabapentin 300 mg 3 times daily.  She has been taking ibuprofen up to 4 times daily as well as Tylenol.  Coping but still having significant pain.  She has tremendous responsibilities with caring for her debilitated husband and also her sister who has advanced dementia.  She does feel like there is some weakness in the right lower extremity at times.  No urine or stool incontinence.  Tolerating gabapentin fairly well and she does feel like this is helping her pain some.  She does have history of osteoporosis and other medical problems include history of hypertension, GERD, hyperlipidemia, chronic mild hyponatremia.  She has hypertension history and we recently added Diovan secondary to poor control.  She already takes amlodipine and metoprolol.  Avoiding thiazides with her history of severe hyponatremia.  She brings in log of blood pressure readings which varies considerably.  Past Medical History:  Diagnosis Date   Chronic insomnia    Colon polyps    Hyperlipidemia    Hypertension    Osteoporosis    Past Surgical History:  Procedure Laterality Date   BREAST EXCISIONAL BIOPSY Left    benign, lump removed many years ago   BREAST SURGERY     1985, 1997   COLONOSCOPY  multiple   KNEE SURGERY  2009   kneecap   OTHER SURGICAL HISTORY     childbirth x 3   TUBAL LIGATION     BP dropped     reports that she has never  smoked. She has never used smokeless tobacco. She reports that she does not drink alcohol and does not use drugs. family history includes Breast cancer in her mother; Cancer in her mother; Colon cancer (age of onset: 8) in her brother; Colon cancer (age of onset: 25) in an other family member; Prostate cancer in her brother; Stroke in her father. Allergies  Allergen Reactions   Follow-Up [Calcilo Xd]    Hydrochlorothiazide Other (See Comments)    Severe hyponatremia.     Simvastatin Other (See Comments)    Body aches    Review of Systems  Constitutional:  Negative for chills, fever and weight loss.  Cardiovascular:  Negative for chest pain.  Gastrointestinal:  Negative for abdominal pain.  Musculoskeletal:  Positive for back pain.  Neurological:  Positive for weakness. Negative for sensory change.      Objective:     BP (!) 140/70   Pulse 69   Temp 97.8 F (36.6 C) (Oral)   Ht 5\' 3"  (1.6 m)   Wt 160 lb 6.4 oz (72.8 kg)   SpO2 98%   BMI 28.41 kg/m  BP Readings from Last 3 Encounters:  12/25/22 (!) 140/70  12/05/22 (!) 158/76  11/26/22 (!) 148/85   Wt Readings from Last 3 Encounters:  12/25/22 160 lb 6.4 oz (72.8 kg)  12/05/22 151 lb (68.5 kg)  11/26/22  154 lb 6.4 oz (70 kg)      Physical Exam Vitals reviewed.  Constitutional:      Appearance: Normal appearance.  Cardiovascular:     Rate and Rhythm: Normal rate and regular rhythm.  Pulmonary:     Effort: Pulmonary effort is normal.     Breath sounds: Normal breath sounds. No wheezing or rales.  Neurological:     Mental Status: She is alert.     Comments: He has basically absence of knee and ankle reflex bilaterally.  She does have some weakness with right knee extension and hip flexion on the right compared to the left but fairly good preserved strength on the right with plantarflexion and dorsiflexion.      No results found for any visits on 12/25/22.  Last CBC Lab Results  Component Value Date   WBC  9.6 11/12/2022   HGB 12.7 11/12/2022   HCT 39.0 11/12/2022   MCV 95.6 11/12/2022   MCH 31.1 11/12/2022   RDW 13.2 11/12/2022   PLT 155 11/12/2022   Last metabolic panel Lab Results  Component Value Date   GLUCOSE 146 (H) 11/12/2022   NA 132 (L) 11/12/2022   K 4.0 11/12/2022   CL 98 11/12/2022   CO2 23 11/12/2022   BUN 12 11/12/2022   CREATININE 0.71 11/12/2022   GFRNONAA >60 11/12/2022   CALCIUM 9.6 11/12/2022   PROT 7.2 11/12/2022   ALBUMIN 4.0 11/12/2022   BILITOT 0.8 11/12/2022   ALKPHOS 99 11/12/2022   AST 33 11/12/2022   ALT 41 11/12/2022   ANIONGAP 11 11/12/2022      The ASCVD Risk score (Arnett DK, et al., 2019) failed to calculate for the following reasons:   The 2019 ASCVD risk score is only valid for ages 40 to 93    Assessment & Plan:   #1 low back pain with right-sided sciatica symptoms.  She is already on Tylenol and gabapentin.  She has unfortunately been taking ibuprofen fairly regularly and we have discouraged her from taking this regularly.  MRI has been scheduled.  #2 hypertension.  Recent poor control.  Slightly improved today compared with last visit after addition of valsartan to her metoprolol and amlodipine.  She has been consuming a fair amount of high sodium foods recently and is encouraged to back that down somewhat.  #3 history of severe hyponatremia.  Sodium stable by recent labs in September.  Check basic metabolic panel at next office follow-up  Evelena Peat, MD

## 2022-12-27 ENCOUNTER — Telehealth: Payer: Self-pay | Admitting: Family Medicine

## 2022-12-27 ENCOUNTER — Encounter: Payer: Self-pay | Admitting: Family Medicine

## 2022-12-27 NOTE — Telephone Encounter (Signed)
Patient called to let Dr. Caryl Never know that even though he showed her in the computer that her MRI referral had been placed that the Imaging place stated they didn't have her orders.  She said she felt like Dr. Caryl Never would want to do that.  Patient had not heard anything about her MRI for her back yet and it had been 2 weeks so she called them herself.    Patient has an appointment on 01/09/23 at 7:40 am.  She is on a wait list in case they can get her in sooner.

## 2022-12-28 ENCOUNTER — Ambulatory Visit
Admission: RE | Admit: 2022-12-28 | Discharge: 2022-12-28 | Disposition: A | Discharge status: Home / Self Care | Payer: Medicare PPO | Source: Ambulatory Visit | Attending: Family Medicine | Admitting: Family Medicine

## 2022-12-28 DIAGNOSIS — M5416 Radiculopathy, lumbar region: Secondary | ICD-10-CM

## 2022-12-28 DIAGNOSIS — M5126 Other intervertebral disc displacement, lumbar region: Secondary | ICD-10-CM | POA: Diagnosis not present

## 2022-12-28 DIAGNOSIS — M47816 Spondylosis without myelopathy or radiculopathy, lumbar region: Secondary | ICD-10-CM | POA: Diagnosis not present

## 2022-12-31 ENCOUNTER — Ambulatory Visit: Payer: Medicare PPO | Admitting: Family Medicine

## 2022-12-31 NOTE — Telephone Encounter (Signed)
Patient informed of the message below and voiced understanding. Patient reported she had completed MRI on 12/28/2022

## 2023-01-03 ENCOUNTER — Telehealth: Payer: Self-pay | Admitting: Family Medicine

## 2023-01-03 NOTE — Telephone Encounter (Signed)
Pharmacy asking should patient be on all 3 of these for blood pressure maintenance: amLODipine (NORVASC) 5 MG tablet metoprolol succinate (TOPROL-XL) 25 MG 24 hr tablet valsartan (DIOVAN) 160 MG tablet Requests a call to confirm

## 2023-01-03 NOTE — Telephone Encounter (Signed)
I spoke with Cassandra Foster at [facility] and he was informed of message below

## 2023-01-04 NOTE — Telephone Encounter (Signed)
Pt is calling checking on MRI result

## 2023-01-08 ENCOUNTER — Telehealth: Payer: Self-pay | Admitting: Family Medicine

## 2023-01-08 NOTE — Telephone Encounter (Signed)
ATC but could not reach patient at this time

## 2023-01-08 NOTE — Telephone Encounter (Signed)
Pt called, returning CMA's call. CMA was with a patient. Pt asked that CMA call back at his earliest convenience. 

## 2023-01-08 NOTE — Telephone Encounter (Signed)
Please see prior phone note.

## 2023-01-08 NOTE — Telephone Encounter (Signed)
Patient informed of the message below and voiced understanding  

## 2023-01-09 ENCOUNTER — Other Ambulatory Visit: Payer: Medicare PPO

## 2023-01-23 ENCOUNTER — Telehealth: Payer: Self-pay | Admitting: Family Medicine

## 2023-01-23 NOTE — Telephone Encounter (Signed)
Pt called, returning CMA's call. CMA was unavailable. Pt asked that CMA call back at his earliest convenience. Pt says it's important.

## 2023-01-28 NOTE — Telephone Encounter (Signed)
Please see result note 

## 2023-02-11 ENCOUNTER — Ambulatory Visit: Payer: Medicare PPO | Admitting: Family Medicine

## 2023-02-11 ENCOUNTER — Encounter: Payer: Self-pay | Admitting: Family Medicine

## 2023-02-11 VITALS — BP 156/72 | HR 90 | Temp 98.2°F | Ht 63.0 in | Wt 157.4 lb

## 2023-02-11 DIAGNOSIS — R5383 Other fatigue: Secondary | ICD-10-CM

## 2023-02-11 DIAGNOSIS — I10 Essential (primary) hypertension: Secondary | ICD-10-CM

## 2023-02-11 DIAGNOSIS — M5431 Sciatica, right side: Secondary | ICD-10-CM

## 2023-02-11 NOTE — Patient Instructions (Signed)
Get back on your BP medications  Monitor blood pressure over next couple of weeks and be in touch if consistently > 140 on top number.

## 2023-02-11 NOTE — Progress Notes (Signed)
Established Patient Office Visit  Subjective   Patient ID: Cassandra Foster, female    DOB: 09/24/1940  Age: 82 y.o. MRN: [id number]  No chief complaint on file.   HPI    [name] is here to discuss recent MRI results lumbar spine.  She had ongoing back pain for quite some time with right-sided sciatica symptoms.  Her plain films showed some nonspecific arthritis changes.  MRI scan showed mild lumbar spondylosis with some disc bulging at multiple levels but without significant spinal stenosis or nerve root encroachment.  There were no acute findings to explain her symptoms.  She is doing somewhat better at this time.  At 1 point her pain was fairly intense.  Denies any urine or stool incontinence.  No lower extremity numbness or weakness.  She is dealing with tremendous stressors with taking care of her 48 year old husband who has advanced dementia and frequently gets up and wanders during the night.  She also has a sister who has advanced dementia as well with increasing behavioral difficulties.  Between watching both of them she frequently gets very little sleep.  She has had some recent increased fatigue.  Her sisters had some recent increased paranoid behavior and agitation.  They are looking at probable placement for her very soon now.  [name] has hypertension and currently takes amlodipine 5 mg daily and metoprolol 25 mg extended release once daily.  She had severe hyponatremia on HCTZ.  Is now on Diovan 160 mg daily but has not been taking this regularly.  She states she has not taken this for several days.  Past Medical History:  Diagnosis Date   Chronic insomnia    Colon polyps    Hyperlipidemia    Hypertension    Osteoporosis    Past Surgical History:  Procedure Laterality Date   BREAST EXCISIONAL BIOPSY Left    benign, lump removed many years ago   BREAST SURGERY     1985, 1997   COLONOSCOPY  multiple   KNEE SURGERY  2009   kneecap   OTHER SURGICAL HISTORY     childbirth  x 3   TUBAL LIGATION     BP dropped     reports that she has never smoked. She has never used smokeless tobacco. She reports that she does not drink alcohol and does not use drugs. family history includes Breast cancer in her mother; Cancer in her mother; Colon cancer (age of onset: 1) in her brother; Colon cancer (age of onset: 75) in an other family member; Prostate cancer in her brother; Stroke in her father. Allergies  Allergen Reactions   Follow-Up [Calcilo Xd]    Hydrochlorothiazide Other (See Comments)    Severe hyponatremia.     Simvastatin Other (See Comments)    Body aches    Review of Systems  Constitutional:  Positive for malaise/fatigue. Negative for chills and fever.  Eyes:  Negative for blurred vision.  Respiratory:  Negative for shortness of breath.   Cardiovascular:  Negative for chest pain.  Genitourinary:  Negative for dysuria.  Neurological:  Negative for dizziness, weakness and headaches.      Objective:     BP (!) 156/72 (BP Location: Left Arm, Patient Position: Sitting, Cuff Size: Normal)   Pulse 90   Temp 98.2 F (36.8 C) (Oral)   Ht 5\' 3"  (1.6 m)   Wt 157 lb 6.4 oz (71.4 kg)   SpO2 98%   BMI 27.88 kg/m  BP Readings from Last 3 Encounters:  02/11/23 (!) 156/72  12/25/22 (!) 140/70  12/05/22 (!) 158/76   Wt Readings from Last 3 Encounters:  02/11/23 157 lb 6.4 oz (71.4 kg)  12/25/22 160 lb 6.4 oz (72.8 kg)  12/05/22 151 lb (68.5 kg)      Physical Exam Vitals reviewed.  Constitutional:      Appearance: She is well-developed.  Eyes:     Pupils: Pupils are equal, round, and reactive to light.  Neck:     Thyroid: No thyromegaly.     Vascular: No JVD.  Cardiovascular:     Rate and Rhythm: Normal rate and regular rhythm.     Heart sounds:     No gallop.  Pulmonary:     Effort: Pulmonary effort is normal. No respiratory distress.     Breath sounds: Normal breath sounds. No wheezing or rales.  Musculoskeletal:     Cervical back: Neck  supple.     Comments: Right leg raises are negative bilaterally.  Neurological:     Mental Status: She is alert.     Comments: Full strength lower extremities with plantarflexion, dorsiflexion, and knee extension.  She has trace reflexes knee and ankle bilaterally.      No results found for any visits on 02/11/23.  Last CBC Lab Results  Component Value Date   WBC 9.6 11/12/2022   HGB 12.7 11/12/2022   HCT 39.0 11/12/2022   MCV 95.6 11/12/2022   MCH 31.1 11/12/2022   RDW 13.2 11/12/2022   PLT 155 11/12/2022   Last metabolic panel Lab Results  Component Value Date   GLUCOSE 146 (H) 11/12/2022   NA 132 (L) 11/12/2022   K 4.0 11/12/2022   CL 98 11/12/2022   CO2 23 11/12/2022   BUN 12 11/12/2022   CREATININE 0.71 11/12/2022   GFRNONAA >60 11/12/2022   CALCIUM 9.6 11/12/2022   PROT 7.2 11/12/2022   ALBUMIN 4.0 11/12/2022   BILITOT 0.8 11/12/2022   ALKPHOS 99 11/12/2022   AST 33 11/12/2022   ALT 41 11/12/2022   ANIONGAP 11 11/12/2022   Last lipids Lab Results  Component Value Date   CHOL 140 10/31/2022   HDL 47.20 10/31/2022   LDLCALC 36 10/31/2022   TRIG 283.0 (H) 10/31/2022   CHOLHDL 3 10/31/2022   Last hemoglobin A1c No results found for: "HGBA1C"    The ASCVD Risk score (Arnett DK, et al., 2019) failed to calculate for the following reasons:   The 2019 ASCVD risk score is only valid for ages 55 to 78    Assessment & Plan:   #1 recent low back pain with sciatica type symptoms.  MRI did not show any clear explanation for her sciatica type symptoms.  Did have some disc bulging at multiple levels but no nerve encroachment.  Nonfocal exam at this time and symptoms improved.  Follow-up for any recurrent sciatica symptoms, lower extremity numbness, or weakness.  #2 hypertension poorly controlled.  She is on amlodipine and metoprolol.  Was prescribed Diovan but has not been taking this regularly.  Had severe hyponatremia with thiazides.  Get back on her Diovan.   Watch sodium intake.  Monitor blood pressure and be in touch if systolics consistently over 140.  #3 fatigue.  Suspect related to tremendous stressors as above.  She is carrying for both her husband who has dementia at age 65 and also a sister who has severe dementia.  This is caused tremendous disruption to her sleep as they both frequently get up and wander during  the night.  She is looking at potential placement options especially for her sister.  Evelena Peat, MD

## 2023-03-19 ENCOUNTER — Encounter: Payer: Self-pay | Admitting: Family Medicine

## 2023-03-19 ENCOUNTER — Telehealth (INDEPENDENT_AMBULATORY_CARE_PROVIDER_SITE_OTHER): Payer: Medicare PPO | Admitting: Family Medicine

## 2023-03-19 VITALS — BP 160/87 | HR 81

## 2023-03-19 DIAGNOSIS — L853 Xerosis cutis: Secondary | ICD-10-CM | POA: Diagnosis not present

## 2023-03-19 DIAGNOSIS — I10 Essential (primary) hypertension: Secondary | ICD-10-CM

## 2023-03-19 MED ORDER — TRIAMCINOLONE ACETONIDE 0.1 % EX CREA: 1.0000 | TOPICAL_CREAM | Freq: Two times a day (BID) | CUTANEOUS | 1 refills | Status: DC | PRN

## 2023-03-19 MED ORDER — HYDRALAZINE HCL 25 MG PO TABS
25.0000 mg | ORAL_TABLET | Freq: Two times a day (BID) | ORAL | 5 refills | Status: DC
Start: 2023-03-19 — End: 2023-05-13

## 2023-03-19 NOTE — Progress Notes (Signed)
Patient ID: Cassandra Foster, female   DOB: 1940/06/01, 83 y.o.   MRN: [id number]  Virtual Visit via Video Note  I connected with Cassandra Foster on 03/19/23 at  9:30 AM EST by a video enabled telemedicine application and verified that I am speaking with the correct person using two identifiers.  Location patient: home Location provider:work or home office Persons participating in the virtual visit: patient, provider  I discussed the limitations of evaluation and management by telemedicine and the availability of in person appointments. The patient expressed understanding and agreed to proceed.   HPI: We set this up for virtual visit but had some technical difficulties.  Patient has history of hypertension, GERD, osteoporosis, chronic insomnia, hyperlipidemia.  Dealing with recent tremendous stress issues with her husband who has dementia and declining health as well as her sister who moved here from [place] several years ago who also has dementia and recent declining cognitive function.  Her sister had become somewhat violent at times and her anger and is now staying at [facility] house which has a memory care unit.  This is a very difficult decision for Cassandra Foster.  Cassandra Foster has history of hypertension currently treated with amlodipine 5 mg daily, valsartan 160 mg daily, and Toprol XL 25 mg daily.  She has had severe hyponatremia on thiazides previously.  Tries to watch sodium intake but tries not be too restrictive with her hyponatremia history.  Even off thiazides her sodium has been 132.  Denies any recent headaches.  No current chest pains.  Home blood pressures have been consistently around 160 recently in spite of her medications above.  No significant peripheral edema.  She also relates eczematous dry rash on her lower legs.  Has not seen much improvement with moisturizers.  Has significant pruritus.   ROS: See pertinent positives and negatives per HPI.  Past Medical History:  Diagnosis Date    Chronic insomnia    Colon polyps    Hyperlipidemia    Hypertension    Osteoporosis     Past Surgical History:  Procedure Laterality Date   BREAST EXCISIONAL BIOPSY Left    benign, lump removed many years ago   BREAST SURGERY     1985, 1997   COLONOSCOPY  multiple   KNEE SURGERY  2009   kneecap   OTHER SURGICAL HISTORY     childbirth x 3   TUBAL LIGATION     BP dropped     Family History  Problem Relation Age of Onset   Cancer Mother        breast   Breast cancer Mother    Stroke Father    Colon cancer Brother 62   Prostate cancer Brother    Colon cancer Other 86    SOCIAL HX: Non-smoker.  Lives in [place] with her husband who has poor health and dementia   Current Outpatient Medications:    albuterol (VENTOLIN HFA) 108 (90 Base) MCG/ACT inhaler, Inhale 2 puffs into the lungs every 4 (four) hours as needed for wheezing or shortness of breath., Disp: 1 each, Rfl: 0   amLODipine (NORVASC) 5 MG tablet, TAKE ONE TABLET DAILY, Disp: 90 tablet, Rfl: 1   aspirin EC 81 MG tablet, Take 81 mg by mouth daily., Disp: , Rfl:    Cholecalciferol (VITAMIN D3) 5000 UNITS CAPS, Take 1 capsule by mouth daily., Disp: , Rfl:    Clobetasol Propionate Emulsion (OLUX-E) 0.05 % topical foam, Apply 1 Application topically 2 (two) times daily., Disp: 100  g, Rfl: 1   denosumab (PROLIA) 60 MG/ML SOSY injection, Inject 60 mg into the skin every 6 (six) months., Disp: , Rfl:    diclofenac Sodium (VOLTAREN) 1 % GEL, Apply 4 g topically 4 (four) times daily. To affected joint., Disp: 100 g, Rfl: 11   gabapentin (NEURONTIN) 300 MG capsule, Take 1 capsule (300 mg total) by mouth 3 (three) times daily., Disp: 90 capsule, Rfl: 3   hydrALAZINE (APRESOLINE) 25 MG tablet, Take 1 tablet (25 mg total) by mouth 2 (two) times daily., Disp: 60 tablet, Rfl: 5   ipratropium (ATROVENT) 0.03 % nasal spray, 1 spray into each nostril Three (3) times a day as needed for rhinitis., Disp: , Rfl:    ketoconazole (NIZORAL)  2 % cream, Apply 1 Application topically daily., Disp: 60 g, Rfl: 1   ketoconazole (NIZORAL) 2 % cream, Apply topically., Disp: , Rfl:    metoprolol succinate (TOPROL-XL) 25 MG 24 hr tablet, Take 1 tablet (25 mg total) by mouth daily., Disp: 90 tablet, Rfl: 3   mometasone (ELOCON) 0.1 % lotion, APPLY TOPICALLY ONCE A DAY AS DIRECTED, Disp: 60 mL, Rfl: 1   Multiple Vitamins-Minerals (PRESERVISION AREDS 2 PO), Take 1 capsule by mouth in the morning and at bedtime., Disp: , Rfl:    pravastatin (PRAVACHOL) 20 MG tablet, TAKE 1 TABLET DAILY, Disp: 90 tablet, Rfl: 1   traZODone (DESYREL) 50 MG tablet, TAKE TWO TABLETS BY MOUTH AT BEDTIME (Patient taking differently: Take 50 mg by mouth at bedtime as needed for sleep.), Disp: 180 tablet, Rfl: 1   valsartan (DIOVAN) 160 MG tablet, Take 1 tablet (160 mg total) by mouth daily., Disp: 90 tablet, Rfl: 0   omeprazole (PRILOSEC) 20 MG capsule, Take 1 capsule (20 mg total) by mouth daily for 14 days., Disp: 14 capsule, Rfl: 0  EXAM:  VITALS per patient if applicable:  GENERAL: alert, oriented, appears well and in no acute distress  HEENT: atraumatic, conjunttiva clear, no obvious abnormalities on inspection of external nose and ears  NECK: normal movements of the head and neck  LUNGS: on inspection no signs of respiratory distress, breathing rate appears normal, no obvious gross SOB, gasping or wheezing  CV: no obvious cyanosis  MS: moves all visible extremities without noticeable abnormality  PSYCH/NEURO: pleasant and cooperative, no obvious depression or anxiety, speech and thought processing grossly intact  ASSESSMENT AND PLAN:  Discussed the following assessment and plan:  #1 hypertension poorly controlled.  Currently on 3 drug regimen with amlodipine, Diovan, and Toprol-XL.  She has had recent consistent home blood pressures around 160s systolic.  History of severe hyponatremia with thiazides.  We discussed several options.  Even though we  could titrate any above medications will not likely see her to goal.  We discussed adding hydralazine 25 mg twice daily and set up office follow-up in 1 month to reassess.  May need further titration.  #2 dry skin dermatitis involving lower legs.  Sent in triamcinolone 0.1% cream to use once or twice daily as needed and continue liberal use of moisturizing lotion as well.   I discussed the assessment and treatment plan with the patient. The patient was provided an opportunity to ask questions and all were answered. The patient agreed with the plan and demonstrated an understanding of the instructions.   The patient was advised to call back or seek an in-person evaluation if the symptoms worsen or if the condition fails to improve as anticipated.     Smitty Cords  Caryl Never, MD

## 2023-04-03 ENCOUNTER — Other Ambulatory Visit: Payer: Self-pay | Admitting: Family Medicine

## 2023-04-12 ENCOUNTER — Other Ambulatory Visit: Payer: Self-pay | Admitting: Family Medicine

## 2023-05-13 ENCOUNTER — Telehealth: Payer: Self-pay

## 2023-05-13 ENCOUNTER — Encounter: Payer: Self-pay | Admitting: Family Medicine

## 2023-05-13 ENCOUNTER — Ambulatory Visit: Admitting: Family Medicine

## 2023-05-13 VITALS — BP 152/70 | HR 80 | Temp 98.0°F | Ht 63.0 in | Wt 160.7 lb

## 2023-05-13 DIAGNOSIS — I10 Essential (primary) hypertension: Secondary | ICD-10-CM

## 2023-05-13 DIAGNOSIS — E871 Hypo-osmolality and hyponatremia: Secondary | ICD-10-CM | POA: Diagnosis not present

## 2023-05-13 DIAGNOSIS — E559 Vitamin D deficiency, unspecified: Secondary | ICD-10-CM | POA: Diagnosis not present

## 2023-05-13 DIAGNOSIS — F5104 Psychophysiologic insomnia: Secondary | ICD-10-CM | POA: Diagnosis not present

## 2023-05-13 MED ORDER — CLOBETASOL PROPIONATE EMULSION 0.05 % EX FOAM: 1.0000 | Freq: Two times a day (BID) | CUTANEOUS | 1 refills | Status: AC

## 2023-05-13 MED ORDER — HYDRALAZINE HCL 50 MG PO TABS: 50.0000 mg | ORAL_TABLET | Freq: Two times a day (BID) | ORAL | 3 refills | Status: DC

## 2023-05-13 NOTE — Progress Notes (Signed)
 Established Patient Office Visit  Subjective   Patient ID: Cassandra Foster, female    DOB: 04-06-1940  Age: 83 y.o. MRN: [id number]  Chief Complaint  Patient presents with   Medical Management of Chronic Issues   Hoarse    X2-3 months, denies sore throat or sinus drainage, using Fluticasone nasal spray x3-4 days with some relief    HPI   Cassandra Foster has history of hypertension, GERD, osteoporosis, chronic insomnia, hyperlipidemia, vitamin D deficiency.  She has been recently dealing with the stress of her sister who had to be placed in a residential care facility.  Her sister has dementia and gets quite agitated.  Her sister basically moved here from [place] needing additional care and live with patient for several years but was becoming more difficult to manage.  Cassandra Foster had power of attorney when she was in [place] but apparently that did not carryover to here.  They recently had to go to court and are still trying to get power of attorney situation worked out.  Cassandra Foster has hypertension which has been recently poorly controlled with several home readings.  She has had some as high as 150 recently.  She is currently on amlodipine, low-dose hydralazine, Diovan, and metoprolol.  She had severe hyponatremia on thiazide.  Does still eat some high sodium foods occasionally.  Sleep disturbance.  Possibly related to increased stress.  She has had issues for quite some time.  Even with taking trazodone 100 mg did not see much benefit.  She has not gotten benefit from melatonin previously  Past Medical History:  Diagnosis Date   Chronic insomnia    Colon polyps    Hyperlipidemia    Hypertension    Osteoporosis    Past Surgical History:  Procedure Laterality Date   BREAST EXCISIONAL BIOPSY Left    benign, lump removed many years ago   BREAST SURGERY     1985, 1997   COLONOSCOPY  multiple   KNEE SURGERY  2009   kneecap   OTHER SURGICAL HISTORY     childbirth x 3   TUBAL LIGATION     BP dropped      reports that she has never smoked. She has never used smokeless tobacco. She reports that she does not drink alcohol and does not use drugs. family history includes Breast cancer in her mother; Cancer in her mother; Colon cancer (age of onset: 89) in her brother; Colon cancer (age of onset: 52) in an other family member; Prostate cancer in her brother; Stroke in her father. Allergies  Allergen Reactions   Follow-Up [Calcilo Xd]    Hydrochlorothiazide Other (See Comments)    Severe hyponatremia.     Simvastatin Other (See Comments)    Body aches     Review of Systems  Eyes:  Negative for blurred vision.  Respiratory:  Negative for shortness of breath.   Cardiovascular:  Negative for chest pain.  Neurological:  Negative for dizziness, weakness and headaches.  Psychiatric/Behavioral:  Negative for depression. The patient has insomnia.       Objective:     BP (!) 152/70 (BP Location: Left Arm, Cuff Size: Normal)   Pulse 80   Temp 98 F (36.7 C) (Oral)   Ht 5\' 3"  (1.6 m)   Wt 160 lb 11.2 oz (72.9 kg)   SpO2 98%   BMI 28.47 kg/m  BP Readings from Last 3 Encounters:  05/13/23 (!) 152/70  03/19/23 (S) (!) 160/87  02/11/23 (!) 156/72  Wt Readings from Last 3 Encounters:  05/13/23 160 lb 11.2 oz (72.9 kg)  02/11/23 157 lb 6.4 oz (71.4 kg)  12/25/22 160 lb 6.4 oz (72.8 kg)      Physical Exam Vitals reviewed.  Constitutional:      General: She is not in acute distress.    Appearance: She is well-developed.  Eyes:     Pupils: Pupils are equal, round, and reactive to light.  Neck:     Thyroid: No thyromegaly.     Vascular: No JVD.  Cardiovascular:     Rate and Rhythm: Normal rate and regular rhythm.     Heart sounds:     No gallop.  Pulmonary:     Effort: Pulmonary effort is normal. No respiratory distress.     Breath sounds: Normal breath sounds. No wheezing or rales.  Musculoskeletal:     Cervical back: Neck supple.  Neurological:     General: No focal  deficit present.     Mental Status: She is alert.  Psychiatric:        Mood and Affect: Mood normal.        Thought Content: Thought content normal.      No results found for any visits on 05/13/23.  Last CBC Lab Results  Component Value Date   WBC 9.6 11/12/2022   HGB 12.7 11/12/2022   HCT 39.0 11/12/2022   MCV 95.6 11/12/2022   MCH 31.1 11/12/2022   RDW 13.2 11/12/2022   PLT 155 11/12/2022   Last metabolic panel Lab Results  Component Value Date   GLUCOSE 146 (H) 11/12/2022   NA 132 (L) 11/12/2022   K 4.0 11/12/2022   CL 98 11/12/2022   CO2 23 11/12/2022   BUN 12 11/12/2022   CREATININE 0.71 11/12/2022   GFRNONAA >60 11/12/2022   CALCIUM 9.6 11/12/2022   PROT 7.2 11/12/2022   ALBUMIN 4.0 11/12/2022   BILITOT 0.8 11/12/2022   ALKPHOS 99 11/12/2022   AST 33 11/12/2022   ALT 41 11/12/2022   ANIONGAP 11 11/12/2022   Last lipids Lab Results  Component Value Date   CHOL 140 10/31/2022   HDL 47.20 10/31/2022   LDLCALC 36 10/31/2022   TRIG 283.0 (H) 10/31/2022   CHOLHDL 3 10/31/2022   Last thyroid functions Lab Results  Component Value Date   TSH 1.57 10/17/2021      The ASCVD Risk score (Arnett DK, et al., 2019) failed to calculate for the following reasons:   The 2019 ASCVD risk score is only valid for ages 16 to 48    Assessment & Plan:   #1 hypertension poorly controlled.  Possibly exacerbated by recent stress issues.  Multiple home readings have been up 150 or greater systolic.  Previous severe hyponatremia with thiazide.  Increase hydralazine to 50 mg twice daily.  Set up follow-up to reassess in about 6 weeks.  Reiterated importance of low-sodium diet.  #2 insomnia.  This has been somewhat chronic.  Not improved with trazodone.  Recommend avoidance of benzodiazepines.  We did discuss other potential options such as doxepin but she declines at this time.  Avoid excessive daytime napping  #3 history of severe hyponatremia.  Patient requesting  follow-up basic metabolic panel.  This is ordered.  #4 history of vitamin D deficiency.  Patient requesting vitamin D level.  She takes 5000 international units daily.   No follow-ups on file.    Cassandra Peat, MD

## 2023-05-13 NOTE — Telephone Encounter (Signed)
 Pt ready for scheduling on or after 06/05/2023  Estimated out-of-pocket cost due at time of visit: $40  Primary Insurance: [employer]  Prior Auth: on file  PA#:  Valid:   This summary of benefits is an estimation of the patient's out-of-pocket cost. Exact cost may vary based on individual plan coverage.

## 2023-05-13 NOTE — Patient Instructions (Signed)
 Increase the hydralazine to 50 mg twice daily.  Set up 6 week follow up.

## 2023-05-14 ENCOUNTER — Other Ambulatory Visit: Payer: Self-pay | Admitting: Family Medicine

## 2023-05-14 LAB — BASIC METABOLIC PANEL
BUN: 14 mg/dL (ref 6–23)
CO2: 22 meq/L (ref 19–32)
Calcium: 9.6 mg/dL (ref 8.4–10.5)
Chloride: 100 meq/L (ref 96–112)
Creatinine, Ser: 0.74 mg/dL (ref 0.40–1.20)
GFR: 75.05 mL/min (ref >60.00)
Glucose, Bld: 127 mg/dL — ABNORMAL HIGH (ref 70–99)
Potassium: 4.1 meq/L (ref 3.5–5.1)
Sodium: 134 meq/L — ABNORMAL LOW (ref 135–145)

## 2023-05-14 LAB — VITAMIN D 25 HYDROXY (VIT D DEFICIENCY, FRACTURES): VITD: 37.57 ng/mL (ref 30.00–100.00)

## 2023-05-21 ENCOUNTER — Encounter: Payer: Self-pay | Admitting: *Deleted

## 2023-06-05 ENCOUNTER — Other Ambulatory Visit: Payer: Self-pay

## 2023-06-05 ENCOUNTER — Ambulatory Visit

## 2023-06-05 MED ORDER — MOMETASONE FUROATE 0.1 % EX SOLN: CUTANEOUS | 1 refills | Status: AC

## 2023-06-05 NOTE — Addendum Note (Signed)
 Addended by: Christy Sartorius on: 06/05/2023 11:55 AM   Modules accepted: Orders

## 2023-06-05 NOTE — Telephone Encounter (Signed)
 Copied from CRM [id number]. Topic: Clinical - Prescription Issue >> Jun 05, 2023 10:47 AM Efraim Kaufmann C wrote: Reason for CRM: patient called regarding edema cream that she wanted to refill for her legs. The bottle has been all rolled up to squeeze the cream out so she said she couldn't read what was on the label but it was a 0.1% lotion. The only thing close to what she was saying was mometasone, however patient is insisting there is a "b" in the name. I do not see that in any of her prescriptions and she asked me to contact the doctor and he would take care of it for her. Her pharmacy is [facility] [place], [place] - [address] [place], [place] [address] Phone: [phone]  Fax: [phone]  Please advise with patient/pharmacy. Thank you.

## 2023-06-06 ENCOUNTER — Other Ambulatory Visit: Payer: Self-pay | Admitting: Family Medicine

## 2023-06-06 NOTE — Telephone Encounter (Signed)
 Copied from CRM [id number]. Topic: Clinical - Prescription Issue >> Jun 06, 2023  2:43 PM Cammy Copa D wrote: Reason for CRM: Pt called back regarding cream that she wanted to refill for her legs. She has found out the the cream she needs that works well for her is called 'betamethasone valerate. 0.1% cream'. She says the prescription ordered yesterday was: mometasone (ELOCON) 0.1 % lotion. She said this one doesn't work for her.   Pharmacy is: [facility] [place], [place] - [address] [place] [address]

## 2023-06-11 MED ORDER — BETAMETHASONE VALERATE 0.1 % EX CREA: TOPICAL_CREAM | Freq: Two times a day (BID) | CUTANEOUS | 0 refills | Status: AC

## 2023-06-20 ENCOUNTER — Ambulatory Visit: Payer: Self-pay

## 2023-06-20 MED ORDER — TRAMADOL HCL 50 MG PO TABS: 50.0000 mg | ORAL_TABLET | Freq: Three times a day (TID) | ORAL | 0 refills | Status: DC | PRN

## 2023-06-20 MED ORDER — TRAMADOL HCL 50 MG PO TABS: 50.0000 mg | ORAL_TABLET | Freq: Four times a day (QID) | ORAL | 0 refills | Status: DC | PRN

## 2023-06-20 NOTE — Telephone Encounter (Signed)
 Chief Complaint: back pain Symptoms: back pain Frequency: x 4 days Pertinent Negatives: Patient denies injury Disposition: [] ED /[] Urgent Care (no appt availability in office) / [x] Appointment(In office/virtual)/ []  [facility]/ [x] Home Care/ [x] Refused Recommended Disposition /[] [facility]/ []  Follow-up with PCP Additional Notes: pt she is having back pain states 5/10 and states that she need something to help her get through her pain. States this is her vertebrae like before. States she is taking extra strength tylenol  and ibuprofen but it is not helping. Pt is not wanting to come in and want to have something called in for her pain. States its not her sciatic its her slipped disc.  Copied from CRM [id number]. Topic: Clinical - Red Word Triage >> Jun 20, 2023  9:35 AM Adonis Hoot wrote: Red Word that prompted transfer to Nurse Triage: slipped disk in back,severe pain,not able to get up Reason for Disposition  [1] SEVERE back pain (e.g., excruciating, unable to do any normal activities) AND [2] not improved 2 hours after pain medicine  Answer Assessment - Initial Assessment Questions 1. ONSET: "When did the pain begin?"      4 days 2. LOCATION: "Where does it hurt?" (upper, mid or lower back)     Lower back 3. SEVERITY: "How bad is the pain?"  (e.g., Scale 1-10; mild, moderate, or severe)   - MILD (1-3): Doesn't interfere with normal activities.    - MODERATE (4-7): Interferes with normal activities or awakens from sleep.    - SEVERE (8-10): Excruciating pain, unable to do any normal activities.      5/10 4. PATTERN: "Is the pain constant?" (e.g., yes, no; constant, intermittent)      constant 5. RADIATION: "Does the pain shoot into your legs or somewhere else?"     Down legs 6. CAUSE:  "What do you think is causing the back pain?"      Slipped disc  8. MEDICINES: "What have you taken so far for the pain?" (e.g., nothing, acetaminophen , NSAIDS)      9. NEUROLOGIC  SYMPTOMS: "Do you have any weakness, numbness, or problems with bowel/bladder control?"     no 10. OTHER SYMPTOMS: "Do you have any other symptoms?" (e.g., fever, abdomen pain, burning with urination, blood in urine)       no  Protocols used: Back Pain-A-AH

## 2023-06-20 NOTE — Addendum Note (Signed)
 Addended by: Marquetta Sit on: 06/20/2023 08:01 PM   Modules accepted: Orders

## 2023-06-21 MED ORDER — TRAMADOL HCL 50 MG PO TABS: 50.0000 mg | ORAL_TABLET | Freq: Four times a day (QID) | ORAL | 0 refills | Status: AC | PRN

## 2023-06-21 NOTE — Addendum Note (Signed)
 Addended by: Aurelio Leer on: 06/21/2023 08:31 AM   Modules accepted: Orders

## 2023-06-21 NOTE — Telephone Encounter (Signed)
 Done  Marquetta Sit MD [facility]

## 2023-06-21 NOTE — Addendum Note (Signed)
 Addended by: Marquetta Sit on: 06/21/2023 01:12 PM   Modules accepted: Orders

## 2023-06-21 NOTE — Telephone Encounter (Signed)
 Faxed to pharmacy

## 2023-06-22 ENCOUNTER — Other Ambulatory Visit: Payer: Self-pay | Admitting: Family Medicine

## 2023-07-01 ENCOUNTER — Encounter: Payer: Self-pay | Admitting: Family Medicine

## 2023-07-01 ENCOUNTER — Ambulatory Visit: Admitting: Family Medicine

## 2023-07-01 VITALS — BP 138/62 | HR 82 | Temp 97.7°F | Wt 158.5 lb

## 2023-07-01 DIAGNOSIS — F5104 Psychophysiologic insomnia: Secondary | ICD-10-CM

## 2023-07-01 DIAGNOSIS — I10 Essential (primary) hypertension: Secondary | ICD-10-CM

## 2023-07-01 DIAGNOSIS — M545 Low back pain, unspecified: Secondary | ICD-10-CM | POA: Diagnosis not present

## 2023-07-01 MED ORDER — TRAMADOL HCL 50 MG PO TABS: 50.0000 mg | ORAL_TABLET | Freq: Four times a day (QID) | ORAL | 0 refills | Status: AC | PRN

## 2023-07-01 NOTE — Patient Instructions (Signed)
 Consider new mattress.

## 2023-07-01 NOTE — Progress Notes (Signed)
 Established Patient Office Visit  Subjective   Patient ID: Cassandra Foster, female    DOB: 17-Mar-1940  Age: 83 y.o. MRN: [id number]  Chief Complaint  Patient presents with   Numbness    Patient complains of left leg numbness, x1 week    Back Pain    Patient complains of back pain, x2 weeks, Tried Tramadol     HPI   Has history of hypertension, GERD, osteoporosis, chronic insomnia, hyperlipidemia.  She had a very stressful past year.  Multiple responsibilities.  Her sister who has dementia was just recently placed this past year and memory care unit.  That has been extremely stressful for her.  Her husband has tremendous healthcare needs and she cares for him along with son and grandson who also have some special needs.  She takes very little time for self.  She has had some ongoing back pain and intermittent radiculitis type symptoms really for the past year.  MRI scan back in November surprisingly did not show any significant spinal stenosis or neural foraminal impingement.  She states for the past week she has had some intermittent numbness in the left leg with some low back pain.  No urine or stool incontinence.  Symptoms are better at this time.  We did send in some tramadol  recently which did help with her night pain.  She had a little mild constipation otherwise tolerating well.  She does state that her current mattress is about 83 years old and she knows she probably needs to replace that.  Denies any recent injury or fall.  She has hypertension which is treated with Diovan , Toprol -XL, loaded pain 5 mg daily, and hydralazine  50 mg twice daily.  She states she is compliant with her medications.  Denies any recent peripheral edema.  She had history of severe hyponatremia on thiazides.  She has longstanding history of insomnia and has had some recent increased fatigue.  Poor sleep quality.  Tramadol  did seem to help with her back pain recently which may have helped her sleep for couple  nights.  Past Medical History:  Diagnosis Date   Chronic insomnia    Colon polyps    Hyperlipidemia    Hypertension    Osteoporosis    Past Surgical History:  Procedure Laterality Date   BREAST EXCISIONAL BIOPSY Left    benign, lump removed many years ago   BREAST SURGERY     1985, 1997   COLONOSCOPY  multiple   KNEE SURGERY  2009   kneecap   OTHER SURGICAL HISTORY     childbirth x 3   TUBAL LIGATION     BP dropped     reports that she has never smoked. She has never used smokeless tobacco. She reports that she does not drink alcohol and does not use drugs. family history includes Breast cancer in her mother; Cancer in her mother; Colon cancer (age of onset: 75) in her brother; Colon cancer (age of onset: 66) in an other family member; Prostate cancer in her brother; Stroke in her father. Allergies  Allergen Reactions   Follow-Up [Calcilo Xd]    Hydrochlorothiazide Other (See Comments)    Severe hyponatremia.     Simvastatin Other (See Comments)    Body aches    Review of Systems  Constitutional:  Positive for malaise/fatigue. Negative for chills and fever.  Eyes:  Negative for blurred vision.  Respiratory:  Negative for shortness of breath.   Cardiovascular:  Negative for chest pain.  Genitourinary:  Negative for dysuria.  Musculoskeletal:  Positive for back pain.  Neurological:  Positive for tingling. Negative for dizziness, focal weakness, weakness and headaches.      Objective:     BP 138/62 (BP Location: Left Arm, Cuff Size: Normal)   Pulse 82   Temp 97.7 F (36.5 C) (Oral)   Wt 158 lb 8 oz (71.9 kg)   SpO2 95%   BMI 28.08 kg/m  BP Readings from Last 3 Encounters:  07/01/23 138/62  05/13/23 (!) 152/70  03/19/23 (S) (!) 160/87   Wt Readings from Last 3 Encounters:  07/01/23 158 lb 8 oz (71.9 kg)  05/13/23 160 lb 11.2 oz (72.9 kg)  02/11/23 157 lb 6.4 oz (71.4 kg)      Physical Exam Vitals reviewed.  Constitutional:      General: She is not  in acute distress.    Appearance: She is not ill-appearing.  Cardiovascular:     Rate and Rhythm: Normal rate and regular rhythm.  Pulmonary:     Effort: Pulmonary effort is normal.     Breath sounds: Normal breath sounds. No wheezing or rales.  Musculoskeletal:     Right lower leg: No edema.     Left lower leg: No edema.  Neurological:     Mental Status: She is alert.     Comments: Straight leg raises are negative bilaterally.  She has full strength with plantarflexion, dorsiflexion, and knee extension bilaterally.  We had difficulty eliciting knee reflexes bilaterally and trace ankle bilaterally.  Normal sensory function to touch throughout.      No results found for any visits on 07/01/23.    The ASCVD Risk score (Arnett DK, et al., 2019) failed to calculate for the following reasons:   The 2019 ASCVD risk score is only valid for ages 74 to 88    Assessment & Plan:   #1 intermittent low back pain with intermittent paresthesias left lower extremity.  MRI scan back in November showed no acute abnormalities and nothing to explain her symptoms.  She has nonfocal exam today.  No weakness.  Follow-up immediately for any urine or stool incontinence or any progressive numbness or weakness.  Symptoms are actually improved some today compared with a week ago.  We did agree to refill her tramadol  for as needed use if back pain not relieved with Tylenol   #2 hypertension.  Blood pressure up a bit today.  Recommend scaling back sodium intake and monitoring closely.  Blood pressure today in office after rest did improve to 138/62.  #3 chronic insomnia.  Avoid bright lights at night.  Avoid late the use of caffeine.  She does not use any alcohol.    Glean Lamy, MD

## 2023-08-13 ENCOUNTER — Other Ambulatory Visit: Payer: Self-pay | Admitting: Family Medicine

## 2023-09-04 ENCOUNTER — Other Ambulatory Visit: Payer: Self-pay | Admitting: Family Medicine

## 2023-09-16 ENCOUNTER — Other Ambulatory Visit: Payer: Self-pay | Admitting: Family Medicine

## 2023-09-18 ENCOUNTER — Other Ambulatory Visit: Payer: Self-pay | Admitting: Family Medicine

## 2023-10-14 ENCOUNTER — Other Ambulatory Visit: Payer: Self-pay | Admitting: Family Medicine

## 2023-11-05 ENCOUNTER — Other Ambulatory Visit: Payer: Self-pay | Admitting: Family Medicine

## 2023-11-13 ENCOUNTER — Encounter: Payer: Self-pay | Admitting: Family Medicine

## 2023-11-13 ENCOUNTER — Ambulatory Visit: Admitting: Family Medicine

## 2023-11-13 ENCOUNTER — Ambulatory Visit: Payer: Self-pay | Admitting: Family Medicine

## 2023-11-13 VITALS — BP 138/60 | HR 94 | Temp 97.5°F | Ht 61.42 in | Wt 161.9 lb

## 2023-11-13 DIAGNOSIS — E785 Hyperlipidemia, unspecified: Secondary | ICD-10-CM | POA: Diagnosis not present

## 2023-11-13 DIAGNOSIS — Z0000 Encounter for general adult medical examination without abnormal findings: Secondary | ICD-10-CM

## 2023-11-13 DIAGNOSIS — I10 Essential (primary) hypertension: Secondary | ICD-10-CM

## 2023-11-13 DIAGNOSIS — M810 Age-related osteoporosis without current pathological fracture: Secondary | ICD-10-CM

## 2023-11-13 LAB — COMPREHENSIVE METABOLIC PANEL WITH GFR
ALT: 32 U/L (ref 0–35)
AST: 30 U/L (ref 0–37)
Albumin: 4.5 g/dL (ref 3.5–5.2)
Alkaline Phosphatase: 98 U/L (ref 39–117)
BUN: 17 mg/dL (ref 6–23)
CO2: 21 meq/L (ref 19–32)
Calcium: 10 mg/dL (ref 8.4–10.5)
Chloride: 102 meq/L (ref 96–112)
Creatinine, Ser: 0.74 mg/dL (ref 0.40–1.20)
GFR: 74.79 mL/min (ref >60.00)
Glucose, Bld: 101 mg/dL — ABNORMAL HIGH (ref 70–99)
Potassium: 4.2 meq/L (ref 3.5–5.1)
Sodium: 135 meq/L (ref 135–145)
Total Bilirubin: 0.4 mg/dL (ref 0.2–1.2)
Total Protein: 7.2 g/dL (ref 6.0–8.3)

## 2023-11-13 LAB — LIPID PANEL
Cholesterol: 156 mg/dL (ref 0–200)
HDL: 50.4 mg/dL (ref >39.00)
LDL Cholesterol: 44 mg/dL (ref 0–99)
NonHDL: 105.31
Total CHOL/HDL Ratio: 3
Triglycerides: 305 mg/dL — ABNORMAL HIGH (ref 0.0–149.0)
VLDL: 61 mg/dL — ABNORMAL HIGH (ref 0.0–40.0)

## 2023-11-13 MED ORDER — AMLODIPINE BESYLATE 10 MG PO TABS: 10.0000 mg | ORAL_TABLET | Freq: Every day | ORAL | 3 refills | Status: DC

## 2023-11-13 NOTE — Progress Notes (Signed)
 Established Patient Office Visit  Subjective   Patient ID: Cassandra Foster, female    DOB: 04-22-1940  Age: 83 y.o. MRN: [id number]  Chief Complaint  Patient presents with   Annual Exam    HPI   Cassandra Foster is here today for physical exam- and to address other medical problems as below.  She is dealing with tremendous stress with her husband who is 11 and has advancing dementia along with some other family concerns.  She states that her faith keeps her going strong.    She has medical problems including hypertension, GERD, osteoporosis, chronic insomnia, hyperlipidemia.  She states her blood pressures recently have been consistently high at home 140s to 150s systolic predominantly.  She has had history of severe hyponatremia with thiazides.  Currently on hydralazine , amlodipine , and Diovan .  She had severe hyponatremia necessitating hospitalization previously.  She states she is compliant with medications.  Tries to watch sodium intake carefully.  Does not eat out very often.  One of her major concerns is ongoing sleep difficulties.  In spite of taking 100 mg of trazodone  she is still struggling to sleep most nights.  She feels like this is causing some exhaustion and stress issues and perhaps even driving up her blood pressure more.  She does not use any alcohol.  No late day use of caffeine.  No daytime naps.  Health maintenance reviewed  -Aged out of further colonoscopies - Tetanus due 2029 - She plans to get flu vaccine later - Pneumonia vaccines and Shingrix complete - She has not had a mammogram in a couple years but is considering setting that up.  Her last DEXA scan was 2021 and she does have osteoporosis.  She is on Prolia  injections  Past Medical History:  Diagnosis Date   Chronic insomnia    Colon polyps    Hyperlipidemia    Hypertension    Osteoporosis    Past Surgical History:  Procedure Laterality Date   BREAST EXCISIONAL BIOPSY Left    benign, lump removed  many years ago   BREAST SURGERY     1985, 1997   COLONOSCOPY  multiple   KNEE SURGERY  2009   kneecap   OTHER SURGICAL HISTORY     childbirth x 3   TUBAL LIGATION     BP dropped     reports that she has never smoked. She has never used smokeless tobacco. She reports that she does not drink alcohol and does not use drugs. family history includes Breast cancer in her mother; Cancer in her mother; Colon cancer (age of onset: 56) in her brother; Colon cancer (age of onset: 74) in an other family member; Prostate cancer in her brother; Stroke in her father. Allergies  Allergen Reactions   Follow-Up [Calcilo Xd]    Hydrochlorothiazide Other (See Comments)    Severe hyponatremia.     Simvastatin Other (See Comments)    Body aches   Family history and social history reviewed with no significant changes  Family History  Problem Relation Age of Onset   Cancer Mother        breast   Breast cancer Mother    Stroke Father    Colon cancer Brother 50   Prostate cancer Brother    Colon cancer Other 39   Social History   Socioeconomic History   Marital status: Married    Spouse name: Not on file   Number of children: 3   Years of education: high school  graduate   Highest education level: High school graduate  Occupational History   Occupation: retired  Tobacco Use   Smoking status: Never   Smokeless tobacco: Never  Vaping Use   Vaping status: Never Used  Substance and Sexual Activity   Alcohol use: No   Drug use: No   Sexual activity: Not on file  Other Topics Concern   Not on file  Social History Narrative   Retired   Married   3 sons   5 grand-children   Social Drivers of Corporate investment banker Strain: Low Risk  (10/16/2021)   Overall Financial Resource Strain (CARDIA)    Difficulty of Paying Living Expenses: Not hard at all  Food Insecurity: No Food Insecurity (10/26/2022)   Hunger Vital Sign    Worried About Running Out of Food in the Last Year: Never true     Ran Out of Food in the Last Year: Never true  Transportation Needs: No Transportation Needs (10/26/2022)   PRAPARE - Administrator, Civil Service (Medical): No    Lack of Transportation (Non-Medical): No  Physical Activity: Sufficiently Active (10/16/2021)   Exercise Vital Sign    Days of Exercise per Week: 7 days    Minutes of Exercise per Session: 30 min  Stress: No Stress Concern Present (10/16/2021)   [employer] of Occupational Health - Occupational Stress Questionnaire    Feeling of Stress : Not at all  Social Connections: Socially Integrated (10/16/2021)   Social Connection and Isolation Panel    Frequency of Communication with Friends and Family: More than three times a week    Frequency of Social Gatherings with Friends and Family: More than three times a week    Attends Religious Services: More than 4 times per year    Active Member of [employer] or Organizations: Yes    Attends Banker Meetings: More than 4 times per year    Marital Status: Married  Catering manager Violence: Not At Risk (10/23/2022)   Humiliation, Afraid, Rape, and Kick questionnaire    Fear of Current or Ex-Partner: No    Emotionally Abused: No    Physically Abused: No    Sexually Abused: No     Review of Systems  Constitutional:  Negative for malaise/fatigue.  Eyes:  Negative for blurred vision.  Respiratory:  Negative for shortness of breath.   Cardiovascular:  Negative for chest pain.  Neurological:  Negative for dizziness, weakness and headaches.      Objective:     BP 138/60 (BP Location: Left Arm, Cuff Size: Normal)   Pulse 94   Temp (!) 97.5 F (36.4 C) (Oral)   Ht 5' 1.42 (1.56 m)   Wt 161 lb 14.4 oz (73.4 kg)   SpO2 96%   BMI 30.18 kg/m  BP Readings from Last 3 Encounters:  11/13/23 138/60  07/01/23 138/62  05/13/23 (!) 152/70   Wt Readings from Last 3 Encounters:  11/13/23 161 lb 14.4 oz (73.4 kg)  07/01/23 158 lb 8 oz (71.9 kg)  05/13/23 160 lb  11.2 oz (72.9 kg)      Physical Exam Vitals reviewed.  Constitutional:      General: She is not in acute distress.    Appearance: She is well-developed. She is not ill-appearing.  Eyes:     Pupils: Pupils are equal, round, and reactive to light.  Neck:     Thyroid : No thyromegaly.     Vascular: No JVD.  Cardiovascular:  Rate and Rhythm: Normal rate and regular rhythm.     Heart sounds:     No gallop.  Pulmonary:     Effort: Pulmonary effort is normal. No respiratory distress.     Breath sounds: Normal breath sounds. No wheezing or rales.  Musculoskeletal:     Cervical back: Neck supple.     Right lower leg: No edema.     Left lower leg: No edema.  Neurological:     General: No focal deficit present.     Mental Status: She is alert.      Results for orders placed or performed in visit on 11/13/23  CMP  Result Value Ref Range   Sodium 135 135 - 145 mEq/L   Potassium 4.2 3.5 - 5.1 mEq/L   Chloride 102 96 - 112 mEq/L   CO2 21 19 - 32 mEq/L   Glucose, Bld 101 (H) 70 - 99 mg/dL   BUN 17 6 - 23 mg/dL   Creatinine, Ser 9.25 0.40 - 1.20 mg/dL   Total Bilirubin 0.4 0.2 - 1.2 mg/dL   Alkaline Phosphatase 98 39 - 117 U/L   AST 30 0 - 37 U/L   ALT 32 0 - 35 U/L   Total Protein 7.2 6.0 - 8.3 g/dL   Albumin 4.5 3.5 - 5.2 g/dL   GFR 25.20 >39.99 mL/min   Calcium 10.0 8.4 - 10.5 mg/dL  Lipid panel  Result Value Ref Range   Cholesterol 156 0 - 200 mg/dL   Triglycerides 694.9 (H) 0.0 - 149.0 mg/dL   HDL 49.59 >60.99 mg/dL   VLDL 38.9 (H) 0.0 - 59.9 mg/dL   LDL Cholesterol 44 0 - 99 mg/dL   Total CHOL/HDL Ratio 3    NonHDL 105.31     Last CBC Lab Results  Component Value Date   WBC 9.6 11/12/2022   HGB 12.7 11/12/2022   HCT 39.0 11/12/2022   MCV 95.6 11/12/2022   MCH 31.1 11/12/2022   RDW 13.2 11/12/2022   PLT 155 11/12/2022   Last metabolic panel Lab Results  Component Value Date   GLUCOSE 101 (H) 11/13/2023   NA 135 11/13/2023   K 4.2 11/13/2023   CL 102  11/13/2023   CO2 21 11/13/2023   BUN 17 11/13/2023   CREATININE 0.74 11/13/2023   GFR 74.79 11/13/2023   CALCIUM 10.0 11/13/2023   PROT 7.2 11/13/2023   ALBUMIN 4.5 11/13/2023   BILITOT 0.4 11/13/2023   ALKPHOS 98 11/13/2023   AST 30 11/13/2023   ALT 32 11/13/2023   ANIONGAP 11 11/12/2022      The ASCVD Risk score (Arnett DK, et al., 2019) failed to calculate for the following reasons:   The 2019 ASCVD risk score is only valid for ages 16 to 78    Assessment & Plan:   Problem List Items Addressed This Visit       Unprioritized   Osteoporosis   Relevant Orders   DG Bone Density   Hypertension   Relevant Medications   amLODipine  (NORVASC ) 10 MG tablet   Hyperlipidemia - Primary   Relevant Medications   amLODipine  (NORVASC ) 10 MG tablet   Other Relevant Orders   Lipid panel (Completed)   CMP (Completed)  Patient is here today for physical exam and medical follow-up to address the above concerns.  We discussed the following health maintenance items  -Recommend flu vaccine and she plans to get later - Discussed follow-up DEXA scan and this was ordered - Patient plans  to set up repeat mammogram - Aged out of further colonoscopies Other vaccines up-to-date-  - Increase amlodipine  to 10 mg daily with poorly controlled blood pressure here and by multiple readings at home.  Watch out for any peripheral edema issues.  She is not a good candidate for thiazides and probably would be careful with Aldactone with her history of severe hyponatremia  -Check labs as above with lipid panel and CMP  Return in about 1 month (around 12/13/2023).    Wolm Scarlet, MD

## 2023-11-26 ENCOUNTER — Ambulatory Visit: Payer: Self-pay

## 2023-11-26 NOTE — Telephone Encounter (Signed)
 FYI Only or Action Required?: Action required by provider: clinical question for provider.  Patient was last seen in primary care on 11/13/2023 by Micheal Wolm ORN, MD.  Called Nurse Triage reporting Results.  Triage Disposition: Call PCP Within 24 Hours  Patient/caregiver understands and will follow disposition?: Yes          opied from CRM #[id number]. Topic: Clinical - Lab/Test Results >> Nov 26, 2023  2:54 PM Suzen RAMAN wrote: Reason for CRM: patient has additional questions pertaining to lab results from 09/17/258 specifically her sodium levels. Reason for Disposition  [1] Follow-up call from patient regarding patient's clinical status AND [2] information NON-URGENT    Pt calling to review lab results from 11/13/23. Pt reports that PCP instructed to limit salt intake to maintain BP.  Triager relayed PCP response, but pt seems to have additional questions re: food intake and how it affects her labs. Triager will forward encounter for Dr  Micheal 's office to review and advise. Patient verbalized understanding and is expecting call back from office for additional guidance.  Answer Assessment - Initial Assessment Questions 1. REASON FOR CALL or QUESTION: What is your reason for calling today? or How can I best     Wants to know results of salt levels 2. CALLER: Document the source of call. (e.g., laboratory staff, caregiver or patient).     Self  Endorses taking 2 BP medications and water pill and reports that PCP instructed to limit salt intake to maintain BP. Current BP 145/66  Protocols used: PCP Call - No Triage-A-AH

## 2023-11-26 NOTE — Telephone Encounter (Signed)
 Patient informed of the results and voiced understanding. Results mailed.

## 2023-12-12 DIAGNOSIS — H353132 Nonexudative age-related macular degeneration, bilateral, intermediate dry stage: Secondary | ICD-10-CM | POA: Diagnosis not present

## 2023-12-12 DIAGNOSIS — H35372 Puckering of macula, left eye: Secondary | ICD-10-CM | POA: Diagnosis not present

## 2023-12-12 DIAGNOSIS — D3132 Benign neoplasm of left choroid: Secondary | ICD-10-CM | POA: Diagnosis not present

## 2023-12-12 DIAGNOSIS — H524 Presbyopia: Secondary | ICD-10-CM | POA: Diagnosis not present

## 2023-12-12 DIAGNOSIS — H04123 Dry eye syndrome of bilateral lacrimal glands: Secondary | ICD-10-CM | POA: Diagnosis not present

## 2023-12-13 ENCOUNTER — Ambulatory Visit: Admitting: Family Medicine

## 2023-12-13 ENCOUNTER — Encounter: Payer: Self-pay | Admitting: Family Medicine

## 2023-12-13 VITALS — BP 150/60 | HR 94 | Temp 98.4°F | Wt 163.1 lb

## 2023-12-13 DIAGNOSIS — I10 Essential (primary) hypertension: Secondary | ICD-10-CM

## 2023-12-13 DIAGNOSIS — R600 Localized edema: Secondary | ICD-10-CM

## 2023-12-13 MED ORDER — TELMISARTAN 40 MG PO TABS: 40.0000 mg | ORAL_TABLET | Freq: Every day | ORAL | 5 refills | Status: AC

## 2023-12-13 NOTE — Progress Notes (Signed)
 Established Patient Office Visit  Subjective   Patient ID: Cassandra Foster, female    DOB: [birth year]  Age: 83 y.o. MRN: [id number]  Chief Complaint  Patient presents with   Medical Management of Chronic Issues    HPI    Cassandra Foster is seen for follow-up hypertension.  Very stressful time for her with taking care of her husband who has advanced dementia, son with disabilities, and sister who was recently admitted from a nursing home with cellulitis.  She has had some chronic insomnia and has been dealing with back difficulties herself.  She has recently taken Advil twice daily.  Last visit, we increased her amlodipine  to 10 mg daily but she has had some edema.  She states she has not been taking Diovan  and initially stated that she was not taking hydralazine  but on further reflection states she is taking hydralazine  twice daily in addition to Toprol .  Previous severe hyponatremia with thiazides.  No alcohol use.  Past Medical History:  Diagnosis Date   Chronic insomnia    Colon polyps    Hyperlipidemia    Hypertension    Osteoporosis    Past Surgical History:  Procedure Laterality Date   BREAST EXCISIONAL BIOPSY Left    benign, lump removed many years ago   BREAST SURGERY     1985, 1997   COLONOSCOPY  multiple   KNEE SURGERY  2009   kneecap   OTHER SURGICAL HISTORY     childbirth x 3   TUBAL LIGATION     BP dropped     reports that she has never smoked. She has never used smokeless tobacco. She reports that she does not drink alcohol and does not use drugs. family history includes Breast cancer in her mother; Cancer in her mother; Colon cancer (age of onset: 36) in her brother; Colon cancer (age of onset: 47) in an other family member; Prostate cancer in her brother; Stroke in her father. Allergies  Allergen Reactions   Follow-Up [Calcilo Xd]    Hydrochlorothiazide Other (See Comments)    Severe hyponatremia.     Simvastatin Other (See Comments)    Body aches    Review  of Systems  Constitutional:  Positive for malaise/fatigue.  Eyes:  Negative for blurred vision.  Respiratory:  Negative for shortness of breath.   Cardiovascular:  Negative for chest pain.  Neurological:  Negative for dizziness, weakness and headaches.      Objective:     BP (!) 150/60   Pulse 94   Temp 98.4 F (36.9 C) (Oral)   Wt 163 lb 1.6 oz (74 kg)   SpO2 96%   BMI 30.40 kg/m  BP Readings from Last 3 Encounters:  12/13/23 (!) 150/60  11/13/23 138/60  07/01/23 138/62   Wt Readings from Last 3 Encounters:  12/13/23 163 lb 1.6 oz (74 kg)  11/13/23 161 lb 14.4 oz (73.4 kg)  07/01/23 158 lb 8 oz (71.9 kg)      Physical Exam Vitals reviewed.  Constitutional:      General: She is not in acute distress.    Appearance: She is well-developed. She is not ill-appearing.  Eyes:     Pupils: Pupils are equal, round, and reactive to light.  Neck:     Thyroid : No thyromegaly.     Vascular: No JVD.  Cardiovascular:     Rate and Rhythm: Normal rate and regular rhythm.     Heart sounds:     No gallop.  Pulmonary:  Effort: Pulmonary effort is normal. No respiratory distress.     Breath sounds: Normal breath sounds. No wheezing or rales.  Musculoskeletal:     Cervical back: Neck supple.     Comments: Trace pitting edema lower legs ankles and feet bilaterally  Neurological:     Mental Status: She is alert.      No results found for any visits on 12/13/23.  Last CBC Lab Results  Component Value Date   WBC 9.6 11/12/2022   HGB 12.7 11/12/2022   HCT 39.0 11/12/2022   MCV 95.6 11/12/2022   MCH 31.1 11/12/2022   RDW 13.2 11/12/2022   PLT 155 11/12/2022   Last metabolic panel Lab Results  Component Value Date   GLUCOSE 101 (H) 11/13/2023   NA 135 11/13/2023   K 4.2 11/13/2023   CL 102 11/13/2023   CO2 21 11/13/2023   BUN 17 11/13/2023   CREATININE 0.74 11/13/2023   GFR 74.79 11/13/2023   CALCIUM 10.0 11/13/2023   PROT 7.2 11/13/2023   ALBUMIN 4.5  11/13/2023   BILITOT 0.4 11/13/2023   ALKPHOS 98 11/13/2023   AST 30 11/13/2023   ALT 32 11/13/2023   ANIONGAP 11 11/12/2022   Last lipids Lab Results  Component Value Date   CHOL 156 11/13/2023   HDL 50.40 11/13/2023   LDLCALC 44 11/13/2023   TRIG 305.0 (H) 11/13/2023   CHOLHDL 3 11/13/2023      The ASCVD Risk score (Arnett DK, et al., 2019) failed to calculate for the following reasons:   The 2019 ASCVD risk score is only valid for ages 60 to 3    Assessment & Plan:   #1 hypertension suboptimally controlled.  Patient currently on hydralazine , Toprol  XL, and amlodipine .  We recently increased amlodipine  to 10 mg daily which has had some edema issues.  Previous severe hyponatremia with thiazides.  We discussed previously caution with Aldactone with her hyponatremia history.  -Reduce amlodipine  back to 5 mg daily with her edema issues - Continue hydralazine  and Toprol -XL - Add telmisartan 40 mg once daily - Set up follow-up in several weeks to reassess - Keep sodium intake down  #2 bilateral leg edema probably related to recent increased dose of amlodipine .  Lung exam is clear.  Suspect edema will improve with reducing amlodipine   Wolm Scarlet, MD

## 2023-12-13 NOTE — Patient Instructions (Signed)
 STOP the Amlodipine  10 mg and go back to Amlodipine  5 mg once daily  Add the Telmisartan 40 mg once daily  Also remain on the Metoprolol  and Hydralazine 

## 2023-12-16 ENCOUNTER — Other Ambulatory Visit: Payer: Self-pay | Admitting: Family Medicine

## 2023-12-16 DIAGNOSIS — Z1231 Encounter for screening mammogram for malignant neoplasm of breast: Secondary | ICD-10-CM

## 2023-12-26 ENCOUNTER — Telehealth: Payer: Self-pay

## 2023-12-31 ENCOUNTER — Ambulatory Visit
Admission: RE | Admit: 2023-12-31 | Discharge: 2023-12-31 | Disposition: A | Source: Ambulatory Visit | Attending: Family Medicine | Admitting: Family Medicine

## 2023-12-31 DIAGNOSIS — Z1231 Encounter for screening mammogram for malignant neoplasm of breast: Secondary | ICD-10-CM

## 2024-01-06 ENCOUNTER — Other Ambulatory Visit: Payer: Self-pay | Admitting: Family Medicine

## 2024-01-07 ENCOUNTER — Ambulatory Visit: Admitting: Family Medicine

## 2024-01-14 ENCOUNTER — Other Ambulatory Visit: Payer: Self-pay | Admitting: Family Medicine

## 2024-02-04 ENCOUNTER — Other Ambulatory Visit: Payer: Self-pay | Admitting: Family Medicine

## 2024-02-06 ENCOUNTER — Other Ambulatory Visit: Payer: Self-pay | Admitting: Family Medicine

## 2024-02-12 ENCOUNTER — Ambulatory Visit: Admitting: Family Medicine

## 2024-02-18 ENCOUNTER — Ambulatory Visit: Admitting: Family Medicine

## 2024-02-25 ENCOUNTER — Encounter: Payer: Self-pay | Admitting: Family Medicine

## 2024-02-25 ENCOUNTER — Ambulatory Visit: Admitting: Family Medicine

## 2024-02-25 VITALS — BP 130/50 | HR 89 | Temp 98.2°F | Wt 160.9 lb

## 2024-02-25 DIAGNOSIS — I10 Essential (primary) hypertension: Secondary | ICD-10-CM | POA: Diagnosis not present

## 2024-02-25 DIAGNOSIS — R202 Paresthesia of skin: Secondary | ICD-10-CM

## 2024-02-25 NOTE — Progress Notes (Signed)
 "  Established Patient Office Visit  Subjective   Patient ID: Cassandra Foster, female    DOB: 03-16-1940  Age: 83 y.o. MRN: [id number]  Chief Complaint  Patient presents with   Medical Management of Chronic Issues    HPI   Cassandra Foster has history of hypertension, GERD, osteoporosis, chronic insomnia, hyperlipidemia.  She has her hands full with taking care of her husband who has advanced dementia, frequently visiting her sister who also has advanced dementia in a local nursing home, and a son who has some special needs.  Her blood pressures been up and we recently had made some changes and current regimen is telmisartan  40 mg daily, Toprol  XL 25 mg daily, hydralazine  50 mg twice daily, and amlodipine  5 mg daily.  She had severe hyponatremia with thiazides.  We were reluctant to use Aldactone because of her hyponatremia history.  Blood pressure finally seems stable on current regimen and has been stable by home readings.  Main complaint today is burning sensation legs bilaterally.  She has no history of diabetes.  No recent B12 level.  No chronic PPI use.  She is also requesting thyroid  screen.  Appetite and weight stable.  Denies any upper extremity paresthesias.  No history of alcohol use.  Past Medical History:  Diagnosis Date   Chronic insomnia    Colon polyps    Hyperlipidemia    Hypertension    Osteoporosis    Past Surgical History:  Procedure Laterality Date   BREAST EXCISIONAL BIOPSY Left    benign, lump removed many years ago   BREAST SURGERY     1985, 1997   COLONOSCOPY  multiple   KNEE SURGERY  2009   kneecap   OTHER SURGICAL HISTORY     childbirth x 3   TUBAL LIGATION     BP dropped     reports that she has never smoked. She has never used smokeless tobacco. She reports that she does not drink alcohol and does not use drugs. family history includes Breast cancer in her mother; Cancer in her mother; Colon cancer (age of onset: 34) in her brother; Colon cancer  (age of onset: 36) in an other family member; Prostate cancer in her brother; Stroke in her father. Allergies[1]  Review of Systems  Constitutional:  Negative for malaise/fatigue.  Eyes:  Negative for blurred vision.  Respiratory:  Negative for shortness of breath.   Cardiovascular:  Negative for chest pain.  Neurological:  Positive for sensory change. Negative for dizziness, focal weakness, weakness and headaches.      Objective:     BP (!) 130/50   Pulse 89   Temp 98.2 F (36.8 C) (Oral)   Wt 160 lb 14.4 oz (73 kg)   SpO2 98%   BMI 29.99 kg/m  BP Readings from Last 3 Encounters:  02/25/24 (!) 130/50  12/13/23 (!) 150/60  11/13/23 138/60   Wt Readings from Last 3 Encounters:  02/25/24 160 lb 14.4 oz (73 kg)  12/13/23 163 lb 1.6 oz (74 kg)  11/13/23 161 lb 14.4 oz (73.4 kg)      Physical Exam Vitals reviewed.  Constitutional:      General: She is not in acute distress.    Appearance: She is not ill-appearing.  Cardiovascular:     Rate and Rhythm: Normal rate and regular rhythm.     Comments: Feet are warm to touch with good dorsalis pedis pulses bilaterally Pulmonary:     Effort: Pulmonary effort is normal.  Breath sounds: Normal breath sounds. No wheezing or rales.  Musculoskeletal:     Right lower leg: No edema.     Left lower leg: No edema.  Neurological:     General: No focal deficit present.     Mental Status: She is alert.     Cranial Nerves: No cranial nerve deficit.     Motor: No weakness.     Comments: Full strength with plantarflexion, dorsiflexion, knee extension bilaterally.  She has difficult to elicit knee reflexes bilaterally but apparently this is chronic.  Only trace Achilles bilaterally.  Normal sensory function in both feet with monofilament testing.  Romberg is normal      No results found for any visits on 02/25/24.    The ASCVD Risk score (Arnett DK, et al., 2019) failed to calculate for the following reasons:   The 2019 ASCVD  risk score is only valid for ages 21 to 77   * - Cholesterol units were assumed    Assessment & Plan:   #1 resistant hypertension.  Probably improved today.  Discussed continuation of lower sodium diet and continue close monitoring.  Try to keep daily sodium intake less than 2500 mg.  #2 paresthesias involving lower extremities.  Symptoms are somewhat intermittent.  Check additional labs with A1c, TSH, B12.  Follow-up for any progressive paresthesias or any new symptoms such as weakness.  Doubt Guillain-Barr.  She does have decreased reflexes lower extremities but this may be chronic   Return in about 4 months (around 06/25/2024).    Cassandra Scarlet, MD     [1]  Allergies Allergen Reactions   Follow-Up [Calcilo Xd]    Hydrochlorothiazide Other (See Comments)    Severe hyponatremia.     Simvastatin Other (See Comments)    Body aches   "

## 2024-02-26 ENCOUNTER — Ambulatory Visit: Payer: Self-pay | Admitting: Family Medicine

## 2024-02-26 LAB — VITAMIN B12: Vitamin B-12: 528 pg/mL (ref 211–911)

## 2024-02-26 LAB — TSH: TSH: 1.13 u[IU]/mL (ref 0.35–5.50)

## 2024-02-26 LAB — HEMOGLOBIN A1C: Hgb A1c MFr Bld: 6 % (ref 4.6–6.5)

## 2024-02-28 ENCOUNTER — Telehealth: Payer: Self-pay | Admitting: *Deleted

## 2024-02-28 NOTE — Telephone Encounter (Signed)
 ATC but could not reach patient at this time

## 2024-02-28 NOTE — Telephone Encounter (Signed)
 Noted

## 2024-02-28 NOTE — Telephone Encounter (Signed)
 Copied from CRM [id number]. Topic: Clinical - Medical Advice >> Feb 28, 2024  3:47 PM Lauren C wrote: Reason for CRM: Pt would like to know if there are any next steps regarding her tingling legs now that her blood work came back pretty much normal besides prediabetes. She says her legs feel a bit better than before, but still tingling. Requesting call back at [phone]

## 2024-02-28 NOTE — Telephone Encounter (Signed)
 Copied from CRM [id number]. Topic: Clinical - Lab/Test Results >> Feb 28, 2024  3:44 PM Tinnie C wrote: Reason for CRM: FYI- Pt returned call for lab results. Results relayed. No further questions. She would like to do recheck of A1c order in 6 months.

## 2024-03-02 NOTE — Telephone Encounter (Signed)
 ATC patient but could not leave voicemail due to mailbox being full

## 2024-03-04 ENCOUNTER — Other Ambulatory Visit: Payer: Self-pay | Admitting: Family Medicine

## 2024-03-10 NOTE — Telephone Encounter (Signed)
 ATC patient but could not leave voicemail due to mailbox being full

## 2024-03-11 ENCOUNTER — Ambulatory Visit: Payer: Self-pay

## 2024-03-11 NOTE — Telephone Encounter (Signed)
 FYI Only or Action Required?: Action required by provider: request for appointment.  Patient was last seen in primary care on 02/25/2024 by Micheal Wolm ORN, MD.  Called Nurse Triage reporting Facial Pain.  Symptoms began a week ago.  Interventions attempted: Rest, hydration, or home remedies.  Symptoms are: gradually worsening. Sinus pressure, yellow, bloody discharge. Requests Monday appointment.  Triage Disposition: See PCP When Office is Open (Within 3 Days)  Patient/caregiver understands and will follow disposition?: Yes     Copied from CRM [id number]. Topic: Clinical - Red Word Triage >> Mar 11, 2024  2:17 PM [name] wrote: Red Word that prompted transfer to Nurse Triage: Had a sinus infection 2 weeks ago. She says she has a lot of yellow mucus with blood in it She says she also has been having headaches as well. She said she took OTC sinus infection with no relief. Reason for Disposition  [1] Sinus congestion (pressure, fullness) AND [2] present > 10 days  Answer Assessment - Initial Assessment Questions 1. LOCATION: Where does it hurt?      face 2. ONSET: When did the sinus pain start?  (e.g., hours, days)      Last week 3. SEVERITY: How bad is the pain?   (Scale 0-10; or none, mild, moderate or severe)     3 head 4. RECURRENT SYMPTOM: Have you ever had sinus problems before? If Yes, ask: When was the last time? and What happened that time?      yes 5. NASAL CONGESTION: Is the nose blocked? If Yes, ask: Can you open it or must you breathe through your mouth?     no 6. NASAL DISCHARGE: Do you have discharge from your nose? If so ask, What color?     Yellow, bloody 7. FEVER: Do you have a fever? If Yes, ask: What is it, how was it measured, and when did it start?      no 8. OTHER SYMPTOMS: Do you have any other symptoms? (e.g., sore throat, cough, earache, difficulty breathing)     no 9. PREGNANCY: Is there any chance you are pregnant? When  was your last menstrual period?     no  Protocols used: Sinus Pain or Congestion-A-AH

## 2024-03-16 ENCOUNTER — Ambulatory Visit: Admitting: Family Medicine

## 2024-03-16 ENCOUNTER — Encounter: Payer: Self-pay | Admitting: Family Medicine

## 2024-03-16 VITALS — BP 140/60 | HR 80 | Temp 98.2°F | Wt 150.5 lb

## 2024-03-16 DIAGNOSIS — J019 Acute sinusitis, unspecified: Secondary | ICD-10-CM | POA: Diagnosis not present

## 2024-03-16 MED ORDER — AMOXICILLIN-POT CLAVULANATE 875-125 MG PO TABS: 1.0000 | ORAL_TABLET | Freq: Two times a day (BID) | ORAL | 0 refills | Status: AC

## 2024-03-16 NOTE — Progress Notes (Signed)
 "  Established Patient Office Visit  Subjective   Patient ID: Cassandra Foster, female    DOB: [birth year]  Age: 84 y.o. MRN: [id number]  Chief Complaint  Patient presents with   Sinusitis    HPI    Cassandra Foster is here with sinusitis type symptoms for the past month.  She has had some yellowish nasal discharge and occasional bloody discharge.  She states her symptoms are slightly improved over the past couple weeks but still has some sinus pressure.  Occasional headaches.  No fever.  Has had some intermittent hoarseness.  No significant cough.  No recent sinusitis.  Past Medical History:  Diagnosis Date   Chronic insomnia    Colon polyps    Hyperlipidemia    Hypertension    Osteoporosis    Past Surgical History:  Procedure Laterality Date   BREAST EXCISIONAL BIOPSY Left    benign, lump removed many years ago   BREAST SURGERY     1985, 1997   COLONOSCOPY  multiple   KNEE SURGERY  2009   kneecap   OTHER SURGICAL HISTORY     childbirth x 3   TUBAL LIGATION     BP dropped     reports that she has never smoked. She has never used smokeless tobacco. She reports that she does not drink alcohol and does not use drugs. family history includes Breast cancer in her mother; Cancer in her mother; Colon cancer (age of onset: 13) in her brother; Colon cancer (age of onset: 6) in an other family member; Prostate cancer in her brother; Stroke in her father. Allergies[1]  Review of Systems  Constitutional:  Negative for chills and fever.  HENT:  Positive for congestion and sinus pain.   Respiratory:  Negative for cough.       Objective:     BP (!) 140/60   Pulse 80   Temp 98.2 F (36.8 C) (Oral)   Wt 150 lb 8 oz (68.3 kg)   SpO2 96%   BMI 28.05 kg/m  BP Readings from Last 3 Encounters:  03/16/24 (!) 140/60  02/25/24 (!) 130/50  12/13/23 (!) 150/60   Wt Readings from Last 3 Encounters:  03/16/24 150 lb 8 oz (68.3 kg)  02/25/24 160 lb 14.4 oz (73 kg)  12/13/23 163 lb  1.6 oz (74 kg)      Physical Exam Vitals reviewed.  Constitutional:      General: She is not in acute distress.    Appearance: She is not ill-appearing.  HENT:     Right Ear: Tympanic membrane normal.     Left Ear: Tympanic membrane normal.     Nose: Nose normal.     Mouth/Throat:     Mouth: Mucous membranes are moist.     Pharynx: Oropharynx is clear.  Cardiovascular:     Rate and Rhythm: Normal rate and regular rhythm.  Pulmonary:     Effort: Pulmonary effort is normal.     Breath sounds: Normal breath sounds. No wheezing or rales.  Neurological:     Mental Status: She is alert.      No results found for any visits on 03/16/24.    The ASCVD Risk score (Arnett DK, et al., 2019) failed to calculate for the following reasons:   The 2019 ASCVD risk score is only valid for ages 54 to 57   * - Cholesterol units were assumed    Assessment & Plan:   1 month history of sinusitis symptoms.  She describes some bloody nasal discharge, yellowish discharge, and intermittent headaches.  Patient nontoxic in appearance.  Afebrile.  Given duration of symptoms recommend starting Augmentin  875 mg twice daily for 10 days.  Follow-up for any persistent or worsening symptoms.  Cassandra Scarlet, MD     [1]  Allergies Allergen Reactions   Follow-Up [Calcilo Xd]    Hydrochlorothiazide Other (See Comments)    Severe hyponatremia.     Simvastatin Other (See Comments)    Body aches   "
